# Patient Record
Sex: Male | Born: 1965 | Race: Black or African American | Hispanic: No | Smoking: Current every day smoker
Health system: Southern US, Community
[De-identification: ages and names within clinical notes are randomized; demographics above are authoritative.]

## PROBLEM LIST (undated history)

## (undated) HISTORY — PX: ANKLE SURGERY: SHX546

---

## 1997-09-28 ENCOUNTER — Emergency Department (HOSPITAL_COMMUNITY): Admission: EM | Admit: 1997-09-28 | Discharge: 1997-09-28 | Payer: Self-pay | Admitting: *Deleted

## 2000-01-29 ENCOUNTER — Emergency Department (HOSPITAL_COMMUNITY): Admission: EM | Admit: 2000-01-29 | Discharge: 2000-01-29 | Payer: Self-pay | Admitting: Emergency Medicine

## 2000-01-29 ENCOUNTER — Encounter: Payer: Self-pay | Admitting: Emergency Medicine

## 2002-02-07 ENCOUNTER — Encounter: Payer: Self-pay | Admitting: Otolaryngology

## 2002-02-07 ENCOUNTER — Observation Stay (HOSPITAL_COMMUNITY): Admission: EM | Admit: 2002-02-07 | Discharge: 2002-02-07 | Payer: Self-pay | Admitting: Emergency Medicine

## 2002-02-07 ENCOUNTER — Emergency Department (HOSPITAL_COMMUNITY): Admission: EM | Admit: 2002-02-07 | Discharge: 2002-02-07 | Payer: Self-pay | Admitting: Emergency Medicine

## 2002-02-07 ENCOUNTER — Encounter: Payer: Self-pay | Admitting: Emergency Medicine

## 2004-10-15 ENCOUNTER — Emergency Department (HOSPITAL_COMMUNITY): Admission: EM | Admit: 2004-10-15 | Discharge: 2004-10-15 | Payer: Self-pay | Admitting: Emergency Medicine

## 2005-05-17 ENCOUNTER — Emergency Department (HOSPITAL_COMMUNITY): Admission: EM | Admit: 2005-05-17 | Discharge: 2005-05-17 | Payer: Self-pay | Admitting: *Deleted

## 2005-11-05 ENCOUNTER — Emergency Department (HOSPITAL_COMMUNITY): Admission: EM | Admit: 2005-11-05 | Discharge: 2005-11-05 | Payer: Self-pay | Admitting: Emergency Medicine

## 2006-06-07 ENCOUNTER — Emergency Department (HOSPITAL_COMMUNITY): Admission: EM | Admit: 2006-06-07 | Discharge: 2006-06-07 | Payer: Self-pay | Admitting: Emergency Medicine

## 2007-01-02 ENCOUNTER — Emergency Department (HOSPITAL_COMMUNITY): Admission: EM | Admit: 2007-01-02 | Discharge: 2007-01-02 | Payer: Self-pay | Admitting: Emergency Medicine

## 2008-01-04 ENCOUNTER — Emergency Department (HOSPITAL_COMMUNITY): Admission: EM | Admit: 2008-01-04 | Discharge: 2008-01-04 | Payer: Self-pay | Admitting: Emergency Medicine

## 2008-04-24 ENCOUNTER — Emergency Department (HOSPITAL_COMMUNITY): Admission: EM | Admit: 2008-04-24 | Discharge: 2008-04-24 | Payer: Self-pay | Admitting: *Deleted

## 2008-04-27 ENCOUNTER — Emergency Department (HOSPITAL_COMMUNITY): Admission: EM | Admit: 2008-04-27 | Discharge: 2008-04-27 | Payer: Self-pay | Admitting: Family Medicine

## 2008-07-06 ENCOUNTER — Emergency Department (HOSPITAL_COMMUNITY): Admission: EM | Admit: 2008-07-06 | Discharge: 2008-07-06 | Payer: Self-pay | Admitting: Emergency Medicine

## 2008-08-09 ENCOUNTER — Emergency Department (HOSPITAL_COMMUNITY): Admission: EM | Admit: 2008-08-09 | Discharge: 2008-08-09 | Payer: Self-pay | Admitting: Emergency Medicine

## 2010-02-03 ENCOUNTER — Emergency Department (HOSPITAL_COMMUNITY)
Admission: EM | Admit: 2010-02-03 | Discharge: 2010-02-03 | Payer: Self-pay | Source: Home / Self Care | Admitting: Emergency Medicine

## 2010-03-09 ENCOUNTER — Emergency Department (HOSPITAL_COMMUNITY)
Admission: EM | Admit: 2010-03-09 | Discharge: 2010-03-09 | Payer: Self-pay | Source: Home / Self Care | Admitting: Emergency Medicine

## 2010-05-14 ENCOUNTER — Emergency Department (HOSPITAL_COMMUNITY)
Admission: EM | Admit: 2010-05-14 | Discharge: 2010-05-14 | Disposition: A | Discharge status: Home / Self Care | Payer: Self-pay | Attending: Emergency Medicine | Admitting: Emergency Medicine

## 2010-05-14 DIAGNOSIS — S0003XA Contusion of scalp, initial encounter: Secondary | ICD-10-CM | POA: Insufficient documentation

## 2010-05-14 DIAGNOSIS — S1093XA Contusion of unspecified part of neck, initial encounter: Secondary | ICD-10-CM | POA: Insufficient documentation

## 2010-05-14 DIAGNOSIS — S01501A Unspecified open wound of lip, initial encounter: Secondary | ICD-10-CM | POA: Insufficient documentation

## 2010-05-20 ENCOUNTER — Emergency Department (HOSPITAL_COMMUNITY)
Admission: EM | Admit: 2010-05-20 | Discharge: 2010-05-20 | Disposition: A | Discharge status: Home / Self Care | Payer: Self-pay | Attending: Emergency Medicine | Admitting: Emergency Medicine

## 2010-05-20 DIAGNOSIS — Z4802 Encounter for removal of sutures: Secondary | ICD-10-CM | POA: Insufficient documentation

## 2010-05-23 LAB — COMPREHENSIVE METABOLIC PANEL
ALT: 17 U/L (ref 0–53)
Alkaline Phosphatase: 64 U/L (ref 39–117)
Chloride: 103 mEq/L (ref 96–112)
GFR calc Af Amer: >60 mL/min (ref >60)
Potassium: 3.9 mEq/L (ref 3.5–5.1)
Sodium: 137 mEq/L (ref 135–145)

## 2010-05-23 LAB — DIFFERENTIAL
Lymphocytes Relative: 26 % (ref 12–46)
Neutrophils Relative %: 59 % (ref 43–77)

## 2010-05-23 LAB — CBC
Platelets: 301 10*3/uL (ref 150–400)
RDW: 14.9 % (ref 11.5–15.5)

## 2010-07-01 NOTE — Op Note (Signed)
NAMEANDRW, MCGUIRT NO.:  [id number]   MEDICAL RECORD NO.:  [id number]                   PATIENT TYPE:  OBV   LOCATION:  2887                                 FACILITY:  [facility]   PHYSICIAN:  Zola Button T. Lazarus Salines, M.D.              DATE OF BIRTH:  1965-11-27   DATE OF PROCEDURE:  02/07/2002  DATE OF DISCHARGE:  02/07/2002                                 OPERATIVE REPORT   PREOPERATIVE DIAGNOSIS:  Possible pharyngeal foreign body.   POSTOPERATIVE DIAGNOSIS:  No foreign body identified, pharyngeal  excoriation.   PROCEDURE:  Direct laryngoscopy, esophagoscopy.   SURGEON:  Gloris Manchester. Lazarus Salines, M.D.   ANESTHESIA:  General orotracheal.   ESTIMATED BLOOD LOSS:  None.   COMPLICATIONS:  None.   FINDINGS:  Some minor excoriation in the left lateral pharynx down toward  the piriform and a small excoriation on the posterior wall of the esophagus  several centimeters below the cricopharyngeus.  Otherwise no foreign body  identified and no other significant lesions.   DESCRIPTION OF PROCEDURE:  With the patient in a comfortable supine  position, general orotracheal anesthesia was induced without difficulty.  At  an appropriate level, the table was turned 90 degrees and the patient placed  in a slight reverse Trendelenburg.  The rubber tooth guard was placed.  Taking care to protect lips, teeth, and endotracheal tube, the Dedo  laryngoscope was introduced.  Full inspection of the oropharynx,  hypopharynx, and larynx were performed with special attention to the  residual tonsils, the lingual tonsils, the valleculae,  the piriforms, and  the laryngeal ventricles.  No findings were noted except as above.  The  laryngoscope was removed.  The cervical esophagoscope was lubricated and  passed easily to the cricopharyngeus muscle and down to its full length with  no significant findings.  This was removed.  The full-length esophagoscope  was lubricated and passed in  the same fashion to its full length, again with  no significant findings.  The esophagoscope was removed.  The tooth guard  was removed.  The entire oral cavity, including the lingual gutter, base of  tongue, tonsils, nasopharynx, oropharynx, hypopharynx, was palpated with no  foreign body identified.  The pharynx was suctioned free of some blood and  old phlegm.  The Dedo laryngoscope was reintroduced, again using the rubber  upper tooth guard, and inspection of the valleculae, base of tongue,  tonsils, piriforms, postcricoid larynx, and esophageal introitus again were  devoid of foreign bodies with no other significant findings.  The  laryngoscope was removed.  The rubber tooth guard was removed.  The dental  status was intact.  The patient was returned to anesthesia, awakened, and  transferred to recovery in stable condition.    COMMENT:  A 45 year old black male not quite 24 hours status post ingestion  of what he thought was a bone from a [employer]  with persistent  foreign body sensation and a negative ER examination was the indication for  today's procedure.  Anticipate a routine postoperative recovery with  attention to analgesia and advancement of routine diet and activities.                                               Gloris Manchester. Lazarus Salines, M.D.    KTW/MEDQ  D:  02/07/2002  T:  02/08/2002  Job:  [id number]

## 2010-07-01 NOTE — Consult Note (Signed)
NAMEDINNIS, ROG NO.:  [id number]   MEDICAL RECORD NO.:  [id number]                   PATIENT TYPE:   LOCATION:                                       FACILITY:  [facility]   PHYSICIAN:  Zola Button T. Lazarus Butler, M.D.              DATE OF BIRTH:  Jun 07, 1965   DATE OF CONSULTATION:  02/07/2002  DATE OF DISCHARGE:  02/07/2002                                   CONSULTATION   REPORT TITLE:  EMERGENCY ROOM CONSULTATION   CHIEF COMPLAINT:  Left throat discomfort.   HISTORY OF PRESENT ILLNESS:  The patient is a 46 year old black male was  eating [employer] for Christmas dinner yesterday and felt something  lodge in his left throat. He attempted to dislodge it by swallowing bread  without success. He was unable to complete his meal. The sensation has  remained there since last evening. He has not had anything to eat or drink  this morning. It feels like it pokes him every single time he tries to  swallow. He feels like he has to clear his throat and also use his voice  gently or it makes it tickle worse. No actual pain in his voice, breathing  difficulty or hoarseness. He has not brought up any blood, no fever, no  prior similar sensations.  He does not have any known reflux.  He has never  had a foreign body lodged previously.   PAST MEDICAL HISTORY:  No history of diabetes, asthma, HIV, hepatitis,  epilepsy. He has had prior ankle surgery. No history of breathing problems  or anesthetic reactions. Nobody in the family has anesthesia or breathing  problems. He is a one pack per day smoker and drinks as much as six beers  per day.   SOCIAL HISTORY:  He is unemployed. He works as a Financial risk analyst. He smokes and drinks  as above.   PHYSICAL EXAMINATION:  GENERAL:  This is a thin, basically healthy  appearing, adult black male. He is using his voice smoothly but quite  softly. He does gently clear his throat quite frequently during the  interview. He is not spitting  or having  trouble handling his secretions.  Mental status is appropriate. He hears well in conversational speech voice.  HEENT:  Head is atraumatic and neck supple. Cranial nerves intact. Ears:  The right ear canal was moderately waxy but not obstructed. The left ear  canal is clear with a normal aerated drum. The anterior nose is clear with a  mild leftward septal deviation and healthy membranes. The oral cavity is  clear with teeth in good repair. He has 1+ tonsils with no visible or  palpable foreign body. On palpation of his tonsils, he does not feel like  this is immediately close to the symptoms he is having; did not examine the  nasopharynx directly. Hypopharynx could not be examined secondary to gag.  NECK:  Examination was normal. Laryngeal crepitus and no adenopathy.   PROCEDURE:  Following Xylocaine viscous in his nose, 10 cc total, the  flexible laryngoscope was introduced. The nasopharynx was clear. The  oropharynx was clear with slightly prominent lingular tonsillar tissue but  no pooling in the valleculae or piriformis.  The endolarynx shows mobile  vocal cords and a good airway. No erythema, no blood. I could not identify a  foreign body.   Finally, I swept my finger over the base of his tongue without any palpable  foreign bodies. The patient did not feel like we were immediately next to  the lesion. The foreign body sensation was improved after the viscous  Xylocaine, but still present.   IMPRESSION:  Possible left pharyngeal/esophageal foreign body.   PLAN:  I recommend, since he is still having active symptoms that we go to  the operating room and, under general anesthesia, do a direct laryngoscopy  and esophagoscopy to attempt to identify and remove a foreign body. I  discussed this with him including the risks and complications. Questions  were answered and informed consent was obtained. A routine preoperative  history and physical was reported without  contraindications.                                               Brett Butler, M.D.    KTW/MEDQ  D:  02/07/2002  T:  02/08/2002  Job:  [id number]

## 2011-11-16 ENCOUNTER — Other Ambulatory Visit: Payer: Self-pay | Admitting: General Practice

## 2011-11-16 ENCOUNTER — Ambulatory Visit
Admission: RE | Admit: 2011-11-16 | Discharge: 2011-11-16 | Disposition: A | Discharge status: Home / Self Care | Payer: No Typology Code available for payment source | Source: Ambulatory Visit | Attending: General Practice | Admitting: General Practice

## 2011-11-16 DIAGNOSIS — Z201 Contact with and (suspected) exposure to tuberculosis: Secondary | ICD-10-CM

## 2012-08-25 ENCOUNTER — Emergency Department (INDEPENDENT_AMBULATORY_CARE_PROVIDER_SITE_OTHER)
Admission: EM | Admit: 2012-08-25 | Discharge: 2012-08-25 | Disposition: A | Discharge status: Home / Self Care | Payer: BC Managed Care – PPO | Source: Home / Self Care

## 2012-08-25 ENCOUNTER — Encounter (HOSPITAL_COMMUNITY): Payer: Self-pay | Admitting: *Deleted

## 2012-08-25 DIAGNOSIS — M549 Dorsalgia, unspecified: Secondary | ICD-10-CM

## 2012-08-25 DIAGNOSIS — G8929 Other chronic pain: Secondary | ICD-10-CM

## 2012-08-25 DIAGNOSIS — T6391XA Toxic effect of contact with unspecified venomous animal, accidental (unintentional), initial encounter: Secondary | ICD-10-CM

## 2012-08-25 DIAGNOSIS — T63461A Toxic effect of venom of wasps, accidental (unintentional), initial encounter: Secondary | ICD-10-CM

## 2012-08-25 NOTE — ED Provider Notes (Signed)
Medical screening examination/treatment/procedure(s) were performed by a resident physician and as supervising physician I was immediately available for consultation/collaboration.  Leslee Home, M.D.   Reuben Likes, MD 08/25/12 [phone]

## 2012-08-25 NOTE — ED Notes (Signed)
Ice pack provided for insect sting.

## 2012-08-25 NOTE — ED Provider Notes (Signed)
Brett Butler is a 47 y.o. male who presents to Urgent Care today for bee sting on the right forearm occurring approximately 60 minutes prior to presentation. Patient initially had local pain and erythema which is improving now. He tried applying tobacco which did not help much. He denies any trouble breathing itching swelling in his mouth lips or tongue and feels well otherwise.   He does however also note a chronic intermittent back pain for the last 10 years or so. He denies any recent injury and feels well currently. 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 He denies any radicular pain weakness or numbness.   PMH reviewed. Healthy otherwise History  Substance Use Topics  . Smoking status: Current Every Day Smoker    Types: Cigarettes  . Smokeless tobacco: Not on file  . Alcohol Use: 6.0 oz/week    10 Cans of beer per week   ROS as above Medications reviewed. No current facility-administered medications for this encounter.   No current outpatient prescriptions on file.    Exam:  BP 113/76  Pulse 77  Temp(Src) 99.1 F (37.3 C) (Oral)  Resp 17  SpO2 99% Gen: Well NAD HEENT: EOMI,  MMM Lungs: CTABL Nl WOB Heart: RRR no MRG Exts: Non edematous BL  LE, warm and well perfused.  Right forearm: Small localized approximately quarter sized area of induration and erythema. Well otherwise.  Back: Nontender to spinal midline normal range of motion and gait.  No results found for this or any previous visit (from the past 24 hour(s)). No results found.  Assessment and Plan: 47 y.o. male with  1) acute bee sting: No signs or symptoms of systemic reaction.  Plan for local care with hydrocortisone cream ice and ibuprofen Followup as needed 2) back pain: Reassurance. Ibuprofen and heat. Handout provided.  Discussed warning signs or symptoms. Please see discharge instructions. Patient expresses understanding.      Rodolph Bong,  MD 08/25/12 [phone]

## 2012-08-25 NOTE — ED Notes (Signed)
Reports being stung by unk insect in right forearm approx 30-60 min ago.  Denies any hx allergic reaction to insect stings - states just wanted to get checked out since "it's been a long, long time since I've been stung".  Has applied tobacco to site.  C/O throbbing.

## 2013-04-09 ENCOUNTER — Encounter (HOSPITAL_COMMUNITY): Payer: Self-pay | Admitting: Emergency Medicine

## 2013-04-09 ENCOUNTER — Emergency Department (HOSPITAL_COMMUNITY)
Admission: EM | Admit: 2013-04-09 | Discharge: 2013-04-09 | Disposition: A | Discharge status: Home / Self Care | Payer: BC Managed Care – PPO | Attending: Emergency Medicine | Admitting: Emergency Medicine

## 2013-04-09 ENCOUNTER — Emergency Department (HOSPITAL_COMMUNITY): Payer: BC Managed Care – PPO

## 2013-04-09 DIAGNOSIS — R51 Headache: Secondary | ICD-10-CM | POA: Insufficient documentation

## 2013-04-09 DIAGNOSIS — G8929 Other chronic pain: Secondary | ICD-10-CM | POA: Insufficient documentation

## 2013-04-09 DIAGNOSIS — F17200 Nicotine dependence, unspecified, uncomplicated: Secondary | ICD-10-CM | POA: Insufficient documentation

## 2013-04-09 DIAGNOSIS — R4182 Altered mental status, unspecified: Secondary | ICD-10-CM | POA: Insufficient documentation

## 2013-04-09 LAB — CBC WITH DIFFERENTIAL/PLATELET
BASOS ABS: 0.1 10*3/uL (ref 0.0–0.1)
Basophils Relative: 1 % (ref 0–1)
EOS ABS: 0.1 10*3/uL (ref 0.0–0.7)
Eosinophils Relative: 2 % (ref 0–5)
HEMATOCRIT: 40.5 % (ref 39.0–52.0)
Hemoglobin: 14 g/dL (ref 13.0–17.0)
LYMPHS ABS: 2.4 10*3/uL (ref 0.7–4.0)
LYMPHS PCT: 34 % (ref 12–46)
MCH: 31.9 pg (ref 26.0–34.0)
MCHC: 34.6 g/dL (ref 30.0–36.0)
MCV: 92.3 fL (ref 78.0–100.0)
Monocytes Absolute: 0.9 10*3/uL (ref 0.1–1.0)
Monocytes Relative: 13 % — ABNORMAL HIGH (ref 3–12)
NEUTROS PCT: 50 % (ref 43–77)
Neutro Abs: 3.4 10*3/uL (ref 1.7–7.7)
PLATELETS: 322 10*3/uL (ref 150–400)
RBC: 4.39 MIL/uL (ref 4.22–5.81)
RDW: 12.8 % (ref 11.5–15.5)
WBC: 6.9 10*3/uL (ref 4.0–10.5)

## 2013-04-09 LAB — COMPREHENSIVE METABOLIC PANEL
ALBUMIN: 4.4 g/dL (ref 3.5–5.2)
ALT: 15 U/L (ref 0–53)
AST: 25 U/L (ref 0–37)
Alkaline Phosphatase: 50 U/L (ref 39–117)
BILIRUBIN TOTAL: 0.4 mg/dL (ref 0.3–1.2)
BUN: 32 mg/dL — AB (ref 6–23)
CALCIUM: 9.9 mg/dL (ref 8.4–10.5)
CHLORIDE: 97 meq/L (ref 96–112)
CO2: 22 mEq/L (ref 19–32)
CREATININE: 0.81 mg/dL (ref 0.50–1.35)
GFR calc Af Amer: >90 mL/min (ref >90)
GLUCOSE: 126 mg/dL — AB (ref 70–99)
Potassium: 3.8 mEq/L (ref 3.7–5.3)
Sodium: 136 mEq/L — ABNORMAL LOW (ref 137–147)
Total Protein: 8.4 g/dL — ABNORMAL HIGH (ref 6.0–8.3)

## 2013-04-09 LAB — RAPID URINE DRUG SCREEN, HOSP PERFORMED
Amphetamines: NOT DETECTED
BARBITURATES: NOT DETECTED
Benzodiazepines: POSITIVE — AB
COCAINE: NOT DETECTED
OPIATES: NOT DETECTED
Tetrahydrocannabinol: NOT DETECTED

## 2013-04-09 LAB — URINALYSIS, ROUTINE W REFLEX MICROSCOPIC
Bilirubin Urine: NEGATIVE
Glucose, UA: NEGATIVE mg/dL
KETONES UR: NEGATIVE mg/dL
Leukocytes, UA: NEGATIVE
NITRITE: NEGATIVE
PH: 5 (ref 5.0–8.0)
PROTEIN: 30 mg/dL — AB
Specific Gravity, Urine: 1.03 (ref 1.005–1.030)
UROBILINOGEN UA: 0.2 mg/dL (ref 0.0–1.0)

## 2013-04-09 LAB — ETHANOL

## 2013-04-09 LAB — URINE MICROSCOPIC-ADD ON

## 2013-04-09 LAB — LIPASE, BLOOD: LIPASE: 14 U/L (ref 11–59)

## 2013-04-09 NOTE — ED Provider Notes (Signed)
CSN: [id number]     Arrival date & time 04/09/13  1741 History   First MD Initiated Contact with Patient 04/09/13 2105     Chief Complaint  Patient presents with  . Altered Mental Status     (Consider location/radiation/quality/duration/timing/severity/associated sxs/prior Treatment) Patient is a 48 y.o. male presenting with altered mental status. The history is provided by the patient.  Altered Mental Status Presenting symptoms: behavior changes   Severity:  Moderate Most recent episode:  Yesterday Episode history:  Continuous Timing:  Constant Progression:  Resolved Chronicity:  New Context comment:  Incarcerated Associated symptoms: headaches   Associated symptoms: no abdominal pain, no fever, no nausea and no vomiting     History reviewed. No pertinent past medical history. Past Surgical History  Procedure Laterality Date  . Ankle surgery     History reviewed. No pertinent family history. History  Substance Use Topics  . Smoking status: Current Every Day Smoker    Types: Cigarettes  . Smokeless tobacco: Not on file  . Alcohol Use: 6.0 oz/week    10 Cans of beer per week    Review of Systems  Constitutional: Negative for fever.  HENT: Negative for drooling and rhinorrhea.   Eyes: Negative for pain.  Respiratory: Negative for cough and shortness of breath.   Cardiovascular: Negative for chest pain and leg swelling.  Gastrointestinal: Negative for nausea, vomiting, abdominal pain and diarrhea.  Genitourinary: Negative for dysuria and hematuria.  Musculoskeletal: Negative for gait problem and neck pain.  Skin: Negative for color change.  Neurological: Positive for headaches. Negative for numbness.  Hematological: Negative for adenopathy.  Psychiatric/Behavioral: Negative for behavioral problems.  All other systems reviewed and are negative.      Allergies  Other  Home Medications   Current Outpatient Rx  Name  Route  Sig  Dispense  Refill  .  chlordiazePOXIDE (LIBRIUM) 25 MG capsule   Oral   Take 25 mg by mouth 3 (three) times daily as needed for anxiety (anxiety).          BP 136/87  Pulse 93  Temp(Src) 99.3 F (37.4 C) (Oral)  Resp 20  SpO2 99% Physical Exam  Nursing note and vitals reviewed. Constitutional: He is oriented to person, place, and time. He appears well-developed and well-nourished.  HENT:  Head: Normocephalic and atraumatic.  Right Ear: External ear normal.  Left Ear: External ear normal.  Nose: Nose normal.  Mouth/Throat: Oropharynx is clear and moist. No oropharyngeal exudate.  Eyes: Conjunctivae and EOM are normal. Pupils are equal, round, and reactive to light.  Neck: Normal range of motion. Neck supple.  Cardiovascular: Normal rate, regular rhythm, normal heart sounds and intact distal pulses.  Exam reveals no gallop and no friction rub.   No murmur heard. Pulmonary/Chest: Effort normal and breath sounds normal. No respiratory distress. He has no wheezes.  Abdominal: Soft. Bowel sounds are normal. He exhibits no distension. There is no tenderness. There is no rebound and no guarding.  Musculoskeletal: Normal range of motion. He exhibits no edema and no tenderness.  Neurological: He is alert and oriented to person, place, and time. He has normal strength. No cranial nerve deficit or sensory deficit. Coordination and gait normal.  Skin: Skin is warm and dry.  Psychiatric: He has a normal mood and affect. His behavior is normal.    ED Course  Procedures (including critical care time) Labs Review Labs Reviewed  CBC WITH DIFFERENTIAL - Abnormal; Notable for the following:  Monocytes Relative 13 (*)    All other components within normal limits  COMPREHENSIVE METABOLIC PANEL - Abnormal; Notable for the following:    Sodium 136 (*)    Glucose, Bld 126 (*)    BUN 32 (*)    Total Protein 8.4 (*)    All other components within normal limits  URINALYSIS, ROUTINE W REFLEX MICROSCOPIC - Abnormal;  Notable for the following:    Color, Urine AMBER (*)    APPearance CLOUDY (*)    Hgb urine dipstick TRACE (*)    Protein, ur 30 (*)    All other components within normal limits  URINE RAPID DRUG SCREEN (HOSP PERFORMED) - Abnormal; Notable for the following:    Benzodiazepines POSITIVE (*)    All other components within normal limits  LIPASE, BLOOD  ETHANOL  URINE MICROSCOPIC-ADD ON   Imaging Review Ct Head Wo Contrast  04/09/2013   CLINICAL DATA:  Possible seizure  EXAM: CT HEAD WITHOUT CONTRAST  TECHNIQUE: Contiguous axial images were obtained from the base of the skull through the vertex without intravenous contrast.  COMPARISON:  Prior CT from 08/09/2008  FINDINGS: There is no acute intracranial hemorrhage or infarct. No mass lesion or midline shift. Gray-white matter differentiation is well maintained. Ventricles are normal in size without evidence of hydrocephalus. CSF containing spaces are within normal limits. No extra-axial fluid collection.  The calvarium is intact.  Orbital soft tissues are within normal limits.  Scattered mucoperiosteal thickening present within the ethmoidal air cells bilaterally. Paranasal sinuses are otherwise clear. No mastoid effusion.  Scalp soft tissues are unremarkable.  IMPRESSION: 1. No acute intracranial process. 2. Mild ethmoidal sinus disease.   Electronically Signed   By: Rise MuBenjamin  McClintock M.D.   On: 04/09/2013 21:45    EKG Interpretation   None       MDM   Final diagnoses:  Altered mental status    9:44 PM 48 y.o. male a history of alcohol use who presents with altered mental status from jail. The patient was incarcerated on February 7 and had been on Librium until the 20th. The librium was discontinued at that time. I spoke with a nurse Lawanna KobusAngel, at jail, he noted that the patient was talking to himself yesterday evening and had slightly unusual behavior. She notes that discontinued today and he was sent here for evaluation. She denies any  other symptoms including fevers, vomiting, diarrhea, or pain. The patient is afebrile and vital signs are unremarkable here. He is alert and oriented x3. He complains of chronic arthritis type pains but notes no change from baseline. The police officer states that while in triage he saw him shaking and staring for several minutes. There is no note of this from nursing. Patient has no history of seizures. He does not appear to be withdrawing from alcohol on my exam. He is calm, interactive, not diaphoretic, and has completely unremarkable vital signs. Will get screening labwork, urinalysis, and CT of head. The nurse at his facility noted that he was restarted on his Librium yesterday. I suspect that his altered mental status was likely related to being off the Librium. The patient states that he drinks one beer per day and sometimes binges. He denies SI/HI.   10:18 PM: Normal neuro exam. I interpreted/reviewed the labs and/or imaging which were non-contributory. I think pt's intermittent AMS likely related to being taken off of librium which he is now back on. Will recommend a longer taper. The pt states he was not  altered and was just praying.   I have discussed the diagnosis/risks/treatment options with the patient and believe the pt to be eligible for discharge home to follow-up with pcp as needed. We also discussed returning to the ED immediately if new or worsening sx occur. We discussed the sx which are most concerning (e.g., recurrent AMS, fever) that necessitate immediate return. Medications administered to the patient during their visit and any new prescriptions provided to the patient are listed below.  Medications given during this visit Medications - No data to display  New Prescriptions   No medications on file     Junius Argyle, MD 04/10/13 [phone]

## 2013-04-09 NOTE — Discharge Instructions (Signed)
Perform screening labwork, urinalysis, urine drug screen, ethanol level, and CT of head. All were noncontributory. The patient is alert and oriented here. He is appropriate on my exam. He denies any suicidal or homicidal ideations. He does not appear to be acutely withdrawing from alcohol. I suspect that his altered mental status may have been related to the discontinuation of Librium. I recommend that he continue taking Librium while he is incarcerated. He may need a longer taper. He can be further evaluated by the jail physician as needed.

## 2013-04-09 NOTE — ED Notes (Signed)
Bed: WA20 Expected date:  Expected time:  Means of arrival:  Comments: Triage. 4 

## 2013-04-09 NOTE — ED Notes (Signed)
Pt in custody.  Pt brought here for confusion and ?changes in eye color as noted by documentation from jail.  Pt is alert and oriented at the times.  Denies any pain and cannot say why he was brought here.  Pt does states he had a headache but now it is gone.

## 2013-04-09 NOTE — ED Notes (Signed)
Patient ambulatory upon discharge without difficulty. Patient denies dizziness/vertigo.

## 2019-04-28 DIAGNOSIS — F1020 Alcohol dependence, uncomplicated: Secondary | ICD-10-CM | POA: Insufficient documentation

## 2019-04-28 DIAGNOSIS — F1121 Opioid dependence, in remission: Secondary | ICD-10-CM | POA: Insufficient documentation

## 2019-04-28 DIAGNOSIS — F439 Reaction to severe stress, unspecified: Secondary | ICD-10-CM | POA: Insufficient documentation

## 2019-04-28 DIAGNOSIS — F411 Generalized anxiety disorder: Secondary | ICD-10-CM | POA: Insufficient documentation

## 2019-07-30 ENCOUNTER — Emergency Department (HOSPITAL_COMMUNITY)
Admission: EM | Admit: 2019-07-30 | Discharge: 2019-07-31 | Disposition: A | Discharge status: Home / Self Care | Payer: Self-pay | Attending: Emergency Medicine | Admitting: Emergency Medicine

## 2019-07-30 DIAGNOSIS — T401X1A Poisoning by heroin, accidental (unintentional), initial encounter: Secondary | ICD-10-CM | POA: Insufficient documentation

## 2019-07-30 DIAGNOSIS — F17210 Nicotine dependence, cigarettes, uncomplicated: Secondary | ICD-10-CM | POA: Insufficient documentation

## 2019-07-30 DIAGNOSIS — Z79899 Other long term (current) drug therapy: Secondary | ICD-10-CM | POA: Insufficient documentation

## 2019-07-30 LAB — COMPREHENSIVE METABOLIC PANEL
ALT: 16 U/L (ref 0–44)
AST: 22 U/L (ref 15–41)
Albumin: 3.8 g/dL (ref 3.5–5.0)
Alkaline Phosphatase: 45 U/L (ref 38–126)
Anion gap: 15 (ref 5–15)
BUN: 17 mg/dL (ref 6–20)
CO2: 17 mmol/L — ABNORMAL LOW (ref 22–32)
Calcium: 8.6 mg/dL — ABNORMAL LOW (ref 8.9–10.3)
Chloride: 106 mmol/L (ref 98–111)
Creatinine, Ser: 1.04 mg/dL (ref 0.61–1.24)
GFR calc Af Amer: >60 mL/min (ref >60)
GFR calc non Af Amer: >60 mL/min (ref >60)
Glucose, Bld: 161 mg/dL — ABNORMAL HIGH (ref 70–99)
Potassium: 3.6 mmol/L (ref 3.5–5.1)
Sodium: 138 mmol/L (ref 135–145)
Total Bilirubin: 0.6 mg/dL (ref 0.3–1.2)
Total Protein: 7 g/dL (ref 6.5–8.1)

## 2019-07-30 LAB — CBC WITH DIFFERENTIAL/PLATELET
Abs Immature Granulocytes: 0.04 10*3/uL (ref 0.00–0.07)
Basophils Absolute: 0.1 10*3/uL (ref 0.0–0.1)
Basophils Relative: 1 %
Eosinophils Absolute: 0.3 10*3/uL (ref 0.0–0.5)
Eosinophils Relative: 4 %
HCT: 38.2 % — ABNORMAL LOW (ref 39.0–52.0)
Hemoglobin: 12.6 g/dL — ABNORMAL LOW (ref 13.0–17.0)
Immature Granulocytes: 1 %
Lymphocytes Relative: 32 %
Lymphs Abs: 2.5 10*3/uL (ref 0.7–4.0)
MCH: 31.8 pg (ref 26.0–34.0)
MCHC: 33 g/dL (ref 30.0–36.0)
MCV: 96.5 fL (ref 80.0–100.0)
Monocytes Absolute: 0.8 10*3/uL (ref 0.1–1.0)
Monocytes Relative: 10 %
Neutro Abs: 4.1 10*3/uL (ref 1.7–7.7)
Neutrophils Relative %: 52 %
Platelets: 303 10*3/uL (ref 150–400)
RBC: 3.96 MIL/uL — ABNORMAL LOW (ref 4.22–5.81)
RDW: 14 % (ref 11.5–15.5)
WBC: 7.8 10*3/uL (ref 4.0–10.5)
nRBC: 0 % (ref 0.0–0.2)

## 2019-07-30 MED ORDER — ONDANSETRON 4 MG PO TBDP
4.0000 mg | ORAL_TABLET | Freq: Once | ORAL | Status: AC
  Administered 2019-07-30: 4 mg via ORAL
  Filled 2019-07-30: qty 1

## 2019-07-30 NOTE — ED Provider Notes (Signed)
Care transferred from [place], [place] at shift change.  See note for full HPI.  In summation 54 year old male presents for evaluation of unintentional overdose. History of polysubstance use.  Given 2 mg Narcan by EMS.  Currently homeless and living with sister.  Previous provider patient has no complaints.  Labs obtained from previous provider.  Pending UDS. Plan to obs patient.  Patient apparently had one episode of NBNB emesis here in ED.  Will give Zofran, fluids. Physical Exam  BP 132/89   Pulse 79   Temp (!) 97.5 F (36.4 C) (Oral)   Resp 13   SpO2 93%   Physical Exam Vitals and nursing note reviewed.  Constitutional:      General: He is not in acute distress.    Appearance: He is well-developed. He is not ill-appearing, toxic-appearing or diaphoretic.  HENT:     Head: Normocephalic and atraumatic.  Eyes:     Pupils: Pupils are equal, round, and reactive to light.  Cardiovascular:     Rate and Rhythm: Normal rate and regular rhythm.     Pulses: Normal pulses.     Heart sounds: Normal heart sounds.  Pulmonary:     Effort: Pulmonary effort is normal. No respiratory distress.     Breath sounds: Normal breath sounds.  Abdominal:     General: Bowel sounds are normal. There is no distension.     Palpations: Abdomen is soft.  Musculoskeletal:        General: Normal range of motion.     Cervical back: Normal range of motion and neck supple.  Skin:    General: Skin is warm and dry.     Capillary Refill: Capillary refill takes less than 2 seconds.  Neurological:     General: No focal deficit present.     Mental Status: He is alert and oriented to person, place, and time.     Comments: AxO x 4. Ambulatory without difficulty. CN 2-12 grossly intact      ED Course/Procedures    No results found.  Procedures Labs Reviewed  RAPID URINE DRUG SCREEN, HOSP PERFORMED - Abnormal; Notable for the following components:      Result Value   Opiates POSITIVE (*)    Cocaine POSITIVE (*)     All other components within normal limits  CBC WITH DIFFERENTIAL/PLATELET - Abnormal; Notable for the following components:   RBC 3.96 (*)    Hemoglobin 12.6 (*)    HCT 38.2 (*)    All other components within normal limits  COMPREHENSIVE METABOLIC PANEL - Abnormal; Notable for the following components:   CO2 17 (*)    Glucose, Bld 161 (*)    Calcium 8.6 (*)    All other components within normal limits   MDM  Polysubstance use. Narcan by EMS. Homeless intermittently lives with sister. GCS 15. Emesis here. OD in past. Unintentional OD here. No SI, HI, AVH.  Plan to obs, p.o. challenge.  0100: Patient states emesis after Zofran. AxO x 4. Will give additional antiemetics and reassess.  0300: Patient reassessed.  Ambulatory at difficulty.  Tolerating p.o. intake.  Patient ANO x4.  Resting DC home.  Offered outpatient resources for substance use. Continues to denies SI, HI, AVH.  The patient has been appropriately medically screened and/or stabilized in the ED. I have low suspicion for any other emergent medical condition which would require further screening, evaluation or treatment in the ED or require inpatient management.  Patient is hemodynamically stable and in no  acute distress.  Patient able to ambulate in department prior to ED.  Evaluation does not show acute pathology that would require ongoing or additional emergent interventions while in the emergency department or further inpatient treatment.  I have discussed the diagnosis with the patient and answered all questions.  Pain is been managed while in the emergency department and patient has no further complaints prior to discharge.  Patient is comfortable with plan discussed in room and is stable for discharge at this time.  I have discussed strict return precautions for returning to the emergency department.  Patient was encouraged to follow-up with PCP/specialist refer to at discharge.     Shakisha Abend A, PA-C 07/31/19  0301    Nira Conn, MD 07/31/19 2108

## 2019-07-30 NOTE — Discharge Instructions (Addendum)
Please read the attachment on local resources available for substance use disorder.  I would like you to call the [facility] to get established with a primary care provider for ongoing evaluation and management of your health and wellbeing.    Return to the ED or seek immediate medical attention should you develop any new or worsening symptoms.

## 2019-07-30 NOTE — ED Provider Notes (Signed)
[facility] EMERGENCY DEPARTMENT Provider Note   CSN: [id number] Arrival date & time: 07/30/19  2129     History Chief Complaint  Patient presents with   Drug Overdose    Brett Butler is a 54 y.o. male with no relevant past medical history who presents to the ED via EMS for drug overdose.  Patient had been witnessed by a pedestrian to be lowering himself to the ground at a gas station.  Upon EMS arrival, patient was unresponsive with agonal respirations.  He was given 2 mg Narcan and began spontaneous respirations.  On my examination, patient is drowsy but answering questions appropriately.  He states that he has been sniffing heroin since 1994.  He denies any IVDA.  He also admits to cocaine use earlier, as well.  He had 2 shots of vodka and 2 beers today PTA.  He states that he drinks approximately every other day, but has never had any withdrawals or seizures.  He denies any benzodiazepine use.  He does smoke cigarettes regularly.  He is currently homeless and living with his sister.  He is in the process of obtaining disability for his chronic back pain.  He denies any injury, pain, shortness of breath, chest discomfort, nausea or vomiting, numbness or weakness, or other symptoms.  He states that he has overdosed in the past, but nothing recently.  He denies any recent depression, SI or HI, AVH, or intentional overdose.  Level 5 caveat due to AMS.  HPI     No past medical history on file.  There are no problems to display for this patient.   Past Surgical History:  Procedure Laterality Date   ANKLE SURGERY         No family history on file.  Social History   Tobacco Use   Smoking status: Current Every Day Smoker    Types: Cigarettes  Substance Use Topics   Alcohol use: Yes    Alcohol/week: 10.0 standard drinks    Types: 10 Cans of beer per week   Drug use: No    Home Medications Prior to Admission medications   Medication Sig Start Date  End Date Taking? Authorizing Provider  chlordiazePOXIDE (LIBRIUM) 25 MG capsule Take 25 mg by mouth 3 (three) times daily as needed for anxiety (anxiety).    [provider]    Allergies    Other  Review of Systems   Review of Systems  Unable to perform ROS: Mental status change  All other systems reviewed and are negative.   Physical Exam Updated Vital Signs BP 112/75    Pulse 74    Temp (!) 97.5 F (36.4 C) (Oral)    Resp 10    SpO2 92%   Physical Exam Vitals and nursing note reviewed. Exam conducted with a chaperone present.  Constitutional:      Comments: Drowsy.  HENT:     Head: Normocephalic and atraumatic.  Eyes:     General: No scleral icterus.    Conjunctiva/sclera: Conjunctivae normal.  Cardiovascular:     Rate and Rhythm: Normal rate and regular rhythm.     Pulses: Normal pulses.     Heart sounds: Normal heart sounds.  Pulmonary:     Effort: Pulmonary effort is normal. No respiratory distress.     Breath sounds: Normal breath sounds.     Comments: No apnea or other evidence of respiratory depression.  12 respirations per minute.  No acute distress. Musculoskeletal:     Comments:  No midline spinal tenderness to palpation. No swelling, erythema, or other overlying skin changes involving back.   Skin:    General: Skin is dry.     Capillary Refill: Capillary refill takes less than 2 seconds.     Comments: No obvious track marks.  Neurological:     General: No focal deficit present.     Mental Status: He is alert and oriented to person, place, and time.     GCS: GCS eye subscore is 4. GCS verbal subscore is 5. GCS motor subscore is 6.     Comments: Able to demonstrate ROM and strength in all extremities. Sensation intact throughout. Gait not yet assessed.     ED Results / Procedures / Treatments   Labs (all labs ordered are listed, but only abnormal results are displayed) Labs Reviewed  CBC WITH DIFFERENTIAL/PLATELET - Abnormal; Notable for the  following components:      Result Value   RBC 3.96 (*)    Hemoglobin 12.6 (*)    HCT 38.2 (*)    All other components within normal limits  COMPREHENSIVE METABOLIC PANEL - Abnormal; Notable for the following components:   CO2 17 (*)    Glucose, Bld 161 (*)    Calcium 8.6 (*)    All other components within normal limits  RAPID URINE DRUG SCREEN, HOSP PERFORMED    EKG None  Radiology No results found.  Procedures Procedures (including critical care time)  Medications Ordered in ED Medications  ondansetron (ZOFRAN-ODT) disintegrating tablet 4 mg (4 mg Oral Given 07/30/19 2345)    ED Course  I have reviewed the triage vital signs and the nursing notes.  Pertinent labs & imaging results that were available during my care of the patient were reviewed by me and considered in my medical decision making (see chart for details).    MDM Rules/Calculators/A&P                          Patient's history and physical exam is consistent with a heroin overdose.  He had received Narcan at approximately 9:30 PM and has since responded quite well.  I feel as though it is reasonable to discharge him after 2 hours of observation assuming that his laboratory work-up is unremarkable, his GCS is 15, VS are stable and WNL, no evidence of respiratory depression, and he can ambulate here in the ED without any assistance.  On subsequent evaluation, patient is still drowsy, but respirations and remainder vital signs are reassuring.  Response to verbal stimuli and can move all extremities.  He is not in any acute distress.  He did become nauseated while I was in the room with an episode of nonbloody emesis.  His QT is WNL and will provide 4 mg Zofran ODT for his symptoms.  He states that he will provide [place] with a urine sample shortly.  Patient is feeling better after his episode of emesis and now after receiving IV Zofran ODT.  GCS 15.  He informs me that he only does heroin "once in a blue moon" however  his cocaine use is more regular.  He once again adamantly denies any IV heroin use or any depression/SI. No obvious track marks.  He states that he is taking care of his mother who was recently diagnosed with lung cancer.  At shift change care was transferred to [facility], PA-C who will follow pending studies, re-evaluate, and determine disposition.     Final  Clinical Impression(s) / ED Diagnoses Final diagnoses:  Accidental overdose of heroin, initial encounter [facility])    Rx / DC Orders ED Discharge Orders    None       Lorelee New, PA-C 07/30/19 2346    Sabas Sous, MD 08/01/19 0028

## 2019-07-30 NOTE — ED Triage Notes (Signed)
Pt BIB [facility] from gas station.   Pt seen guiding himself to the ground by pedestrian, upon EMS arrival pt unresponsive, agonal respirations. Pt given 2mg  Narcan, spontaneous respirations.   Pt admits to cocaine and heroine usage.   A&ox3 on arrival, lethargic, stuporous, poor historian.

## 2019-07-31 LAB — RAPID URINE DRUG SCREEN, HOSP PERFORMED
Amphetamines: NOT DETECTED
Barbiturates: NOT DETECTED
Benzodiazepines: NOT DETECTED
Cocaine: POSITIVE — AB
Opiates: POSITIVE — AB
Tetrahydrocannabinol: NOT DETECTED

## 2019-07-31 MED ORDER — ONDANSETRON HCL 4 MG/2ML IJ SOLN
4.0000 mg | Freq: Once | INTRAMUSCULAR | Status: AC
  Administered 2019-07-31: 4 mg via INTRAVENOUS
  Filled 2019-07-31: qty 2

## 2019-07-31 NOTE — ED Notes (Signed)
Patient ambulated without assistance.

## 2019-08-22 ENCOUNTER — Ambulatory Visit (HOSPITAL_COMMUNITY): Payer: Self-pay | Admitting: Licensed Clinical Social Worker

## 2019-09-10 ENCOUNTER — Ambulatory Visit (INDEPENDENT_AMBULATORY_CARE_PROVIDER_SITE_OTHER): Payer: No Payment, Other | Admitting: Psychiatry

## 2019-09-10 ENCOUNTER — Encounter (HOSPITAL_COMMUNITY): Payer: Self-pay | Admitting: Psychiatry

## 2019-09-10 ENCOUNTER — Other Ambulatory Visit: Payer: Self-pay

## 2019-09-10 DIAGNOSIS — F251 Schizoaffective disorder, depressive type: Secondary | ICD-10-CM | POA: Diagnosis not present

## 2019-09-10 DIAGNOSIS — F1120 Opioid dependence, uncomplicated: Secondary | ICD-10-CM | POA: Diagnosis not present

## 2019-09-10 DIAGNOSIS — F4310 Post-traumatic stress disorder, unspecified: Secondary | ICD-10-CM | POA: Diagnosis not present

## 2019-09-10 DIAGNOSIS — F1220 Cannabis dependence, uncomplicated: Secondary | ICD-10-CM | POA: Insufficient documentation

## 2019-09-10 MED ORDER — LURASIDONE HCL 40 MG PO TABS: 40.0000 mg | ORAL_TABLET | Freq: Every day | ORAL | 2 refills | Status: DC

## 2019-09-10 MED ORDER — DULOXETINE HCL 30 MG PO CPEP: 30.0000 mg | ORAL_CAPSULE | Freq: Two times a day (BID) | ORAL | 2 refills | Status: DC
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
## 2019-11-12 ENCOUNTER — Ambulatory Visit (HOSPITAL_COMMUNITY): Payer: No Payment, Other | Admitting: Psychiatry

## 2020-06-15 ENCOUNTER — Emergency Department (HOSPITAL_COMMUNITY)
Admission: EM | Admit: 2020-06-15 | Discharge: 2020-06-15 | Disposition: A | Discharge status: Home / Self Care | Payer: Self-pay | Attending: Emergency Medicine | Admitting: Emergency Medicine

## 2020-06-15 ENCOUNTER — Other Ambulatory Visit: Payer: Self-pay

## 2020-06-15 ENCOUNTER — Encounter (HOSPITAL_COMMUNITY): Payer: Self-pay

## 2020-06-15 DIAGNOSIS — F17210 Nicotine dependence, cigarettes, uncomplicated: Secondary | ICD-10-CM | POA: Insufficient documentation

## 2020-06-15 DIAGNOSIS — T50901A Poisoning by unspecified drugs, medicaments and biological substances, accidental (unintentional), initial encounter: Secondary | ICD-10-CM

## 2020-06-15 DIAGNOSIS — T401X1A Poisoning by heroin, accidental (unintentional), initial encounter: Secondary | ICD-10-CM | POA: Insufficient documentation

## 2020-06-15 LAB — RAPID URINE DRUG SCREEN, HOSP PERFORMED
Amphetamines: NOT DETECTED
Barbiturates: NOT DETECTED
Benzodiazepines: NOT DETECTED
Cocaine: POSITIVE — AB
Opiates: NOT DETECTED
Tetrahydrocannabinol: NOT DETECTED

## 2020-06-15 MED ORDER — ONDANSETRON HCL 4 MG/2ML IJ SOLN
INTRAMUSCULAR | Status: AC
  Administered 2020-06-15: 4 mg via INTRAVENOUS
  Filled 2020-06-15: qty 2

## 2020-06-15 MED ORDER — NALOXONE HCL 2 MG/2ML IJ SOSY
PREFILLED_SYRINGE | INTRAMUSCULAR | Status: AC
  Administered 2020-06-15: 2 mg via INTRAVENOUS
  Filled 2020-06-15: qty 2

## 2020-06-15 MED ORDER — NALOXONE HCL 2 MG/2ML IJ SOSY: 2.0000 mg | PREFILLED_SYRINGE | Freq: Once | INTRAMUSCULAR | Status: AC

## 2020-06-15 MED ORDER — SODIUM CHLORIDE 0.9 % IV SOLN: Freq: Once | INTRAVENOUS | Status: AC

## 2020-06-15 MED ORDER — ONDANSETRON HCL 4 MG/2ML IJ SOLN: 4.0000 mg | Freq: Once | INTRAMUSCULAR | Status: AC

## 2020-06-15 NOTE — ED Notes (Signed)
Sister  Sink [phone] would like an update when possible

## 2020-06-15 NOTE — ED Notes (Signed)
Pt verbalizes understanding of d/c instructions. Pt taken to lobby via wheelchair at d/c with all belongings.  

## 2020-06-15 NOTE — ED Notes (Signed)
Pt resting comfortably in bed with eyes closed, no acute distress noted at this time.  

## 2020-06-15 NOTE — ED Provider Notes (Signed)
[facility] EMERGENCY DEPARTMENT Provider Note   CSN: [id number] Arrival date & time: 06/15/20  0044     History Chief Complaint  Patient presents with  . Drug Overdose    Brett Butler is a 55 y.o. male.  The history is provided by the patient and the EMS personnel.  Drug Overdose This is a new problem. The current episode started less than 1 hour ago. The problem occurs constantly. The problem has been rapidly improving. Pertinent negatives include no chest pain, no abdominal pain, no headaches and no shortness of breath. Nothing aggravates the symptoms. Nothing relieves the symptoms. Treatments tried: narcan. The treatment provided significant relief.  Patient with a h/o polysubstance use Snorted heroin just PTA and was unresponsive.  No SOB, no CP, no back pain.  Was only trying to get high.  Denies SI and HI, no AH no VH. Was nauseated and somnolent on arrival and received zofran and then narcan prior to getting a room.     History reviewed. No pertinent past medical history.  Patient Active Problem List   Diagnosis Date Noted  . Schizoaffective disorder, depressive type (HCC) 09/10/2019  . Opioid use disorder, moderate, dependence (HCC) 09/10/2019  . Cannabis use disorder, moderate, dependence (HCC) 09/10/2019  . PTSD (post-traumatic stress disorder) 09/10/2019    Past Surgical History:  Procedure Laterality Date  . ANKLE SURGERY         History reviewed. No pertinent family history.  Social History   Tobacco Use  . Smoking status: Current Every Day Smoker    Types: Cigarettes  Substance Use Topics  . Alcohol use: Yes    Alcohol/week: 10.0 standard drinks    Types: 10 Cans of beer per week  . Drug use: No    Home Medications Prior to Admission medications   Medication Sig Start Date End Date Taking? Authorizing Provider  loratadine (CLARITIN) 10 MG tablet Take 10 mg by mouth daily as needed for allergies.   Yes [provider]   DULoxetine (CYMBALTA) 30 MG capsule Take 1 capsule (30 mg total) by mouth 2 (two) times daily. Patient not taking: Reported on 06/15/2020 09/10/19 09/09/20  Zena Amos, MD  lurasidone (LATUDA) 40 MG TABS tablet Take 1 tablet (40 mg total) by mouth daily with supper. Patient not taking: Reported on 06/15/2020 09/10/19   Zena Amos, MD    Allergies    Other  Review of Systems   Review of Systems  Constitutional: Negative for fever.  HENT: Negative for congestion.   Eyes: Negative for visual disturbance.  Respiratory: Negative for cough and shortness of breath.   Cardiovascular: Negative for chest pain, palpitations and leg swelling.  Gastrointestinal: Negative for abdominal pain.  Genitourinary: Negative for difficulty urinating.  Musculoskeletal: Negative for back pain and neck pain.  Skin: Negative for rash.  Neurological: Negative for facial asymmetry and headaches.  Psychiatric/Behavioral: Negative for agitation.  All other systems reviewed and are negative.   Physical Exam Updated Vital Signs BP (!) 183/86 (BP Location: Right Arm)   Pulse (!) 121   Temp 98.1 F (36.7 C) (Oral)   Resp (!) 21   Ht 5\' 8"  (1.727 m)   Wt 65.8 kg   SpO2 94%   BMI 22.05 kg/m   Physical Exam Vitals and nursing note reviewed.  Constitutional:      General: He is not in acute distress.    Appearance: Normal appearance.  HENT:     Head: Normocephalic and atraumatic.  Nose: Nose normal.  Eyes:     Conjunctiva/sclera: Conjunctivae normal.     Pupils: Pupils are equal, round, and reactive to light.  Cardiovascular:     Rate and Rhythm: Normal rate and regular rhythm.     Pulses: Normal pulses.     Heart sounds: Normal heart sounds.  Musculoskeletal:        General: Normal range of motion.     Cervical back: Normal range of motion and neck supple.  Skin:    General: Skin is warm and dry.     Capillary Refill: Capillary refill takes less than 2 seconds.  Neurological:     General:  No focal deficit present.     Mental Status: He is alert and oriented to person, place, and time.     Deep Tendon Reflexes: Reflexes normal.  Psychiatric:        Mood and Affect: Mood normal.        Behavior: Behavior normal.        Thought Content: Thought content normal. Thought content does not include homicidal or suicidal ideation. Thought content does not include homicidal or suicidal plan.     ED Results / Procedures / Treatments   Labs (all labs ordered are listed, but only abnormal results are displayed) Labs Reviewed - No data to display  EKG None  Radiology No results found.  Procedures Procedures   Medications Ordered in ED Medications  naloxone [facility]) injection 2 mg (2 mg Intravenous Given 06/15/20 0056)  ondansetron (ZOFRAN) injection 4 mg (4 mg Intravenous Given 06/15/20 0056)  0.9 %  sodium chloride infusion ( Intravenous New Bag/Given 06/15/20 0056)    ED Course  I have reviewed the triage vital signs and the nursing notes.  Pertinent labs & imaging results that were available during my care of the patient were reviewed by me and considered in my medical decision making (see chart for details).    Observed in the ED, lungs are clear.  Patient is mentating appropriately.  PO challenged in the ED> Stable for discharge with close follow up.  Please stop using all drugs.    Brett Butler was evaluated in Emergency Department on 06/15/2020 for the symptoms described in the history of present illness. He was evaluated in the context of the global COVID-19 pandemic, which necessitated consideration that the patient might be at risk for infection with the SARS-CoV-2 virus that causes COVID-19. Institutional protocols and algorithms that pertain to the evaluation of patients at risk for COVID-19 are in a state of rapid change based on information released by regulatory bodies including the CDC and federal and state organizations. These policies and algorithms were followed  during the patient's care in the ED.  Final Clinical Impression(s) / ED Diagnoses Return for intractable cough, coughing up blood, fevers >100.4 unrelieved by medication, shortness of breath, intractable vomiting, chest pain, shortness of breath, weakness, numbness, changes in speech, facial asymmetry, abdominal pain, passing out, Inability to tolerate liquids or food, cough, altered mental status or any concerns. No signs of systemic illness or infection. The patient is nontoxic-appearing on exam and vital signs are within normal limits.  I have reviewed the triage vital signs and the nursing notes. Pertinent labs & imaging results that were available during my care of the patient were reviewed by me and considered in my medical decision making (see chart for details). After history, exam, and medical workup I feel the patient has been appropriately medically screened and is safe for  discharge home. Pertinent diagnoses were discussed with the patient. Patient was given return precautions.   Aitan Rossbach, MD 06/15/20 2162

## 2020-06-15 NOTE — ED Triage Notes (Signed)
EMS arrival, heroin overdose time of use unk, per EMS pt walked to a residence asked homeowner to call 911. Per EMS pt RR 10 with periods of apneia upon arrival. 1mg  Nasal Narcan administered, 89% RA, 3L O2 via [place] placed SPO2 improved to 96%. Pt currently 96% RA. GCS 15, pupils 2 round and reactive.

## 2020-09-28 ENCOUNTER — Emergency Department (HOSPITAL_COMMUNITY): Payer: Medicaid Other

## 2020-09-28 ENCOUNTER — Encounter (HOSPITAL_COMMUNITY): Payer: Self-pay

## 2020-09-28 ENCOUNTER — Other Ambulatory Visit: Payer: Self-pay

## 2020-09-28 ENCOUNTER — Inpatient Hospital Stay (HOSPITAL_COMMUNITY)
Admission: EM | Admit: 2020-09-28 | Discharge: 2020-10-22 | DRG: 674 | Disposition: A | Discharge status: Home / Self Care | Payer: Medicaid Other | Attending: Internal Medicine | Admitting: Internal Medicine

## 2020-09-28 DIAGNOSIS — I10 Essential (primary) hypertension: Secondary | ICD-10-CM | POA: Diagnosis present

## 2020-09-28 DIAGNOSIS — I4892 Unspecified atrial flutter: Secondary | ICD-10-CM | POA: Diagnosis not present

## 2020-09-28 DIAGNOSIS — K5900 Constipation, unspecified: Secondary | ICD-10-CM | POA: Diagnosis present

## 2020-09-28 DIAGNOSIS — E871 Hypo-osmolality and hyponatremia: Secondary | ICD-10-CM | POA: Diagnosis present

## 2020-09-28 DIAGNOSIS — Z20822 Contact with and (suspected) exposure to covid-19: Secondary | ICD-10-CM | POA: Diagnosis present

## 2020-09-28 DIAGNOSIS — F1410 Cocaine abuse, uncomplicated: Secondary | ICD-10-CM | POA: Diagnosis present

## 2020-09-28 DIAGNOSIS — E875 Hyperkalemia: Secondary | ICD-10-CM | POA: Diagnosis present

## 2020-09-28 DIAGNOSIS — M6282 Rhabdomyolysis: Secondary | ICD-10-CM | POA: Diagnosis present

## 2020-09-28 DIAGNOSIS — S7012XA Contusion of left thigh, initial encounter: Secondary | ICD-10-CM | POA: Diagnosis present

## 2020-09-28 DIAGNOSIS — I4891 Unspecified atrial fibrillation: Secondary | ICD-10-CM | POA: Diagnosis not present

## 2020-09-28 DIAGNOSIS — I248 Other forms of acute ischemic heart disease: Secondary | ICD-10-CM | POA: Diagnosis present

## 2020-09-28 DIAGNOSIS — M25452 Effusion, left hip: Secondary | ICD-10-CM

## 2020-09-28 DIAGNOSIS — E872 Acidosis: Secondary | ICD-10-CM | POA: Diagnosis present

## 2020-09-28 DIAGNOSIS — X58XXXA Exposure to other specified factors, initial encounter: Secondary | ICD-10-CM | POA: Diagnosis present

## 2020-09-28 DIAGNOSIS — F1210 Cannabis abuse, uncomplicated: Secondary | ICD-10-CM | POA: Diagnosis present

## 2020-09-28 DIAGNOSIS — G5702 Lesion of sciatic nerve, left lower limb: Secondary | ICD-10-CM | POA: Diagnosis not present

## 2020-09-28 DIAGNOSIS — E559 Vitamin D deficiency, unspecified: Secondary | ICD-10-CM | POA: Diagnosis present

## 2020-09-28 DIAGNOSIS — F1110 Opioid abuse, uncomplicated: Secondary | ICD-10-CM | POA: Diagnosis present

## 2020-09-28 DIAGNOSIS — T796XXA Traumatic ischemia of muscle, initial encounter: Secondary | ICD-10-CM | POA: Diagnosis not present

## 2020-09-28 DIAGNOSIS — F17210 Nicotine dependence, cigarettes, uncomplicated: Secondary | ICD-10-CM | POA: Diagnosis present

## 2020-09-28 DIAGNOSIS — E860 Dehydration: Secondary | ICD-10-CM | POA: Diagnosis present

## 2020-09-28 DIAGNOSIS — D72829 Elevated white blood cell count, unspecified: Secondary | ICD-10-CM

## 2020-09-28 DIAGNOSIS — D649 Anemia, unspecified: Secondary | ICD-10-CM | POA: Diagnosis not present

## 2020-09-28 DIAGNOSIS — N179 Acute kidney failure, unspecified: Principal | ICD-10-CM

## 2020-09-28 DIAGNOSIS — E8809 Other disorders of plasma-protein metabolism, not elsewhere classified: Secondary | ICD-10-CM | POA: Diagnosis present

## 2020-09-28 DIAGNOSIS — R34 Anuria and oliguria: Secondary | ICD-10-CM | POA: Diagnosis present

## 2020-09-28 DIAGNOSIS — R600 Localized edema: Secondary | ICD-10-CM | POA: Diagnosis present

## 2020-09-28 DIAGNOSIS — F259 Schizoaffective disorder, unspecified: Secondary | ICD-10-CM | POA: Diagnosis present

## 2020-09-28 DIAGNOSIS — N170 Acute kidney failure with tubular necrosis: Secondary | ICD-10-CM | POA: Diagnosis not present

## 2020-09-28 DIAGNOSIS — R569 Unspecified convulsions: Secondary | ICD-10-CM | POA: Diagnosis not present

## 2020-09-28 DIAGNOSIS — Z79899 Other long term (current) drug therapy: Secondary | ICD-10-CM

## 2020-09-28 DIAGNOSIS — S7402XA Injury of sciatic nerve at hip and thigh level, left leg, initial encounter: Secondary | ICD-10-CM | POA: Diagnosis present

## 2020-09-28 DIAGNOSIS — I480 Paroxysmal atrial fibrillation: Secondary | ICD-10-CM | POA: Diagnosis not present

## 2020-09-28 DIAGNOSIS — F1491 Cocaine use, unspecified, in remission: Secondary | ICD-10-CM | POA: Diagnosis present

## 2020-09-28 DIAGNOSIS — M60862 Other myositis, left lower leg: Secondary | ICD-10-CM | POA: Diagnosis present

## 2020-09-28 HISTORY — DX: Acute kidney failure, unspecified: N17.9

## 2020-09-28 LAB — COMPREHENSIVE METABOLIC PANEL
ALT: 522 U/L — ABNORMAL HIGH (ref 0–44)
AST: 1244 U/L — ABNORMAL HIGH (ref 15–41)
Albumin: 4.1 g/dL (ref 3.5–5.0)
Alkaline Phosphatase: 72 U/L (ref 38–126)
Anion gap: >20 — ABNORMAL HIGH (ref 5–15)
BUN: 34 mg/dL — ABNORMAL HIGH (ref 6–20)
CO2: 19 mmol/L — ABNORMAL LOW (ref 22–32)
Calcium: 8.3 mg/dL — ABNORMAL LOW (ref 8.9–10.3)
Chloride: 96 mmol/L — ABNORMAL LOW (ref 98–111)
Creatinine, Ser: 4.95 mg/dL — ABNORMAL HIGH (ref 0.61–1.24)
GFR, Estimated: 13 mL/min — ABNORMAL LOW (ref >60)
Glucose, Bld: 128 mg/dL — ABNORMAL HIGH (ref 70–99)
Potassium: 6.5 mmol/L (ref 3.5–5.1)
Sodium: 137 mmol/L (ref 135–145)
Total Bilirubin: 0.4 mg/dL (ref 0.3–1.2)
Total Protein: 8.9 g/dL — ABNORMAL HIGH (ref 6.5–8.1)

## 2020-09-28 LAB — CBC WITH DIFFERENTIAL/PLATELET
Abs Immature Granulocytes: 0.43 10*3/uL — ABNORMAL HIGH (ref 0.00–0.07)
Basophils Absolute: 0.1 10*3/uL (ref 0.0–0.1)
Basophils Relative: 0 %
Eosinophils Absolute: 0 10*3/uL (ref 0.0–0.5)
Eosinophils Relative: 0 %
HCT: 57.4 % — ABNORMAL HIGH (ref 39.0–52.0)
Hemoglobin: 18.3 g/dL — ABNORMAL HIGH (ref 13.0–17.0)
Immature Granulocytes: 2 %
Lymphocytes Relative: 6 %
Lymphs Abs: 1.7 10*3/uL (ref 0.7–4.0)
MCH: 31.6 pg (ref 26.0–34.0)
MCHC: 31.9 g/dL (ref 30.0–36.0)
MCV: 99.1 fL (ref 80.0–100.0)
Monocytes Absolute: 1.4 10*3/uL — ABNORMAL HIGH (ref 0.1–1.0)
Monocytes Relative: 5 %
Neutro Abs: 23.6 10*3/uL — ABNORMAL HIGH (ref 1.7–7.7)
Neutrophils Relative %: 87 %
Platelets: 316 10*3/uL (ref 150–400)
RBC: 5.79 MIL/uL (ref 4.22–5.81)
RDW: 14.6 % (ref 11.5–15.5)
WBC: 27.3 10*3/uL — ABNORMAL HIGH (ref 4.0–10.5)
nRBC: 0.1 % (ref 0.0–0.2)

## 2020-09-28 LAB — LACTIC ACID, PLASMA
Lactic Acid, Venous: 5.1 mmol/L (ref 0.5–1.9)
Lactic Acid, Venous: 4.1 mmol/L (ref 0.5–1.9)

## 2020-09-28 LAB — RAPID URINE DRUG SCREEN, HOSP PERFORMED
Amphetamines: NOT DETECTED
Barbiturates: NOT DETECTED
Benzodiazepines: NOT DETECTED
Cocaine: POSITIVE — AB
Opiates: NOT DETECTED
Tetrahydrocannabinol: NOT DETECTED

## 2020-09-28 LAB — ETHANOL: Alcohol, Ethyl (B): <10 mg/dL (ref <10)

## 2020-09-28 LAB — RESP PANEL BY RT-PCR (FLU A&B, COVID) ARPGX2
Influenza A by PCR: NEGATIVE
Influenza B by PCR: NEGATIVE
SARS Coronavirus 2 by RT PCR: NEGATIVE

## 2020-09-28 LAB — C-REACTIVE PROTEIN: CRP: 13.9 mg/dL — ABNORMAL HIGH (ref <1.0)

## 2020-09-28 LAB — SEDIMENTATION RATE: Sed Rate: 16 mm/hr (ref 0–16)

## 2020-09-28 LAB — CK: Total CK: >50000 U/L — ABNORMAL HIGH (ref 49–397)

## 2020-09-28 MED ORDER — SODIUM CHLORIDE 0.9 % IV BOLUS
500.0000 mL | Freq: Once | INTRAVENOUS | Status: AC
  Administered 2020-09-28: 500 mL via INTRAVENOUS

## 2020-09-28 MED ORDER — DEXTROSE 50 % IV SOLN
1.0000 | Freq: Once | INTRAVENOUS | Status: AC
  Administered 2020-09-28: 50 mL via INTRAVENOUS
  Filled 2020-09-28: qty 50

## 2020-09-28 MED ORDER — SODIUM ZIRCONIUM CYCLOSILICATE 10 G PO PACK
10.0000 g | PACK | Freq: Once | ORAL | Status: AC
  Administered 2020-09-28: 10 g via ORAL
  Filled 2020-09-28: qty 1

## 2020-09-28 MED ORDER — LACTATED RINGERS IV SOLN: INTRAVENOUS | Status: DC

## 2020-09-28 MED ORDER — SODIUM CHLORIDE 0.9 % IV SOLN
2.0000 g | Freq: Once | INTRAVENOUS | Status: AC
  Administered 2020-09-28: 2 g via INTRAVENOUS
  Filled 2020-09-28: qty 2

## 2020-09-28 MED ORDER — LACTATED RINGERS IV BOLUS (SEPSIS)
1000.0000 mL | Freq: Once | INTRAVENOUS | Status: AC
  Administered 2020-09-28: 1000 mL via INTRAVENOUS

## 2020-09-28 MED ORDER — SODIUM CHLORIDE 0.9 % IV SOLN: INTRAVENOUS | Status: DC

## 2020-09-28 MED ORDER — INSULIN ASPART 100 UNIT/ML IV SOLN
10.0000 [IU] | Freq: Once | INTRAVENOUS | Status: AC
  Administered 2020-09-28: 10 [IU] via INTRAVENOUS
  Filled 2020-09-28: qty 0.1

## 2020-09-28 MED ORDER — VANCOMYCIN HCL IN DEXTROSE 1-5 GM/200ML-% IV SOLN
1000.0000 mg | Freq: Once | INTRAVENOUS | Status: AC
  Administered 2020-09-28: 1000 mg via INTRAVENOUS
  Filled 2020-09-28: qty 200

## 2020-09-28 MED ORDER — METRONIDAZOLE 500 MG/100ML IV SOLN
500.0000 mg | Freq: Once | INTRAVENOUS | Status: AC
  Administered 2020-09-28: 500 mg via INTRAVENOUS
  Filled 2020-09-28: qty 100

## 2020-09-28 NOTE — ED Provider Notes (Signed)
[facility]-EMERGENCY DEPT Provider Note   CSN: [id number] Arrival date & time: 09/28/20  1149     History Chief Complaint  Patient presents with   Pain    Brett Butler is a 55 y.o. male.  54 year old male who presents with cute onset of lower back pain with associated left leg pain and weakness that is worse with movement.  Patient admits to snorting an unknown substance last p.m.  States has been up for 24 hours.  Patient states that he did fall.  Denies any fever or chills.  States that pain is severe and worse with any movement.  Denies any bowel or bladder dysfunction.  No perineal numbness or tingling.  When EMS was called, patient was found to be minimally responsive.  Was given Narcan with good relief.      History reviewed. No pertinent past medical history.  Patient Active Problem List   Diagnosis Date Noted   Schizoaffective disorder, depressive type (HCC) 09/10/2019   Opioid use disorder, moderate, dependence (HCC) 09/10/2019   Cannabis use disorder, moderate, dependence (HCC) 09/10/2019   PTSD (post-traumatic stress disorder) 09/10/2019    Past Surgical History:  Procedure Laterality Date   ANKLE SURGERY         History reviewed. No pertinent family history.  Social History   Tobacco Use   Smoking status: Every Day    Types: Cigarettes  Substance Use Topics   Alcohol use: Yes    Alcohol/week: 10.0 standard drinks    Types: 10 Cans of beer per week   Drug use: No    Home Medications Prior to Admission medications   Medication Sig Start Date End Date Taking? Authorizing Provider  DULoxetine (CYMBALTA) 30 MG capsule Take 1 capsule (30 mg total) by mouth 2 (two) times daily. Patient not taking: Reported on 06/15/2020 09/10/19 09/09/20  Zena Amos, MD  loratadine (CLARITIN) 10 MG tablet Take 10 mg by mouth daily as needed for allergies.    [provider]  lurasidone (LATUDA) 40 MG TABS tablet Take 1 tablet (40 mg total)  by mouth daily with supper. Patient not taking: Reported on 06/15/2020 09/10/19   Zena Amos, MD    Allergies    Other  Review of Systems   Review of Systems  All other systems reviewed and are negative.  Physical Exam Updated Vital Signs BP 132/83   Pulse (!) 106   Temp 97.8 F (36.6 C) (Oral)   Resp 18   SpO2 97%   Physical Exam Vitals and nursing note reviewed.  Constitutional:      General: He is not in acute distress.    Appearance: Normal appearance. He is well-developed. He is not toxic-appearing.  HENT:     Head: Normocephalic and atraumatic.  Eyes:     General: Lids are normal.     Conjunctiva/sclera: Conjunctivae normal.     Pupils: Pupils are equal, round, and reactive to light.  Neck:     Thyroid: No thyroid mass.     Trachea: No tracheal deviation.  Cardiovascular:     Rate and Rhythm: Normal rate and regular rhythm.     Heart sounds: Normal heart sounds. No murmur heard.   No gallop.  Pulmonary:     Effort: Pulmonary effort is normal. No respiratory distress.     Breath sounds: Normal breath sounds. No stridor. No decreased breath sounds, wheezing, rhonchi or rales.  Abdominal:     General: There is no distension.  Palpations: Abdomen is soft.     Tenderness: There is no abdominal tenderness. There is no rebound.  Musculoskeletal:        General: No tenderness. Normal range of motion.     Cervical back: Normal range of motion and neck supple.     Comments: Pain along lower lumbar spine.  Pain with movement of left lower extremity.  Left thigh compartment soft.  Pain to palpation along thigh however.  Dorsalis pedis pulses weak on left foot.  No withdrawal to painful stimuli on left foot.  Cap refills less than 4 seconds.  Skin:    General: Skin is warm and dry.     Findings: No abrasion or rash.  Neurological:     Mental Status: He is alert and oriented to person, place, and time. Mental status is at baseline.     GCS: GCS eye subscore is 4. GCS  verbal subscore is 5. GCS motor subscore is 6.     Cranial Nerves: Cranial nerves are intact. No cranial nerve deficit.     Sensory: No sensory deficit.     Motor: Motor function is intact.  Psychiatric:        Attention and Perception: Attention normal.        Speech: Speech normal.        Behavior: Behavior normal.    ED Results / Procedures / Treatments   Labs (all labs ordered are listed, but only abnormal results are displayed) Labs Reviewed  CBC WITH DIFFERENTIAL/PLATELET  COMPREHENSIVE METABOLIC PANEL  RAPID URINE DRUG SCREEN, HOSP PERFORMED  ETHANOL    EKG None  Radiology No results found.  Procedures Procedures   Medications Ordered in ED Medications  0.9 %  sodium chloride infusion (has no administration in time range)    ED Course  I have reviewed the triage vital signs and the nursing notes.  Pertinent labs & imaging results that were available during my care of the patient were reviewed by me and considered in my medical decision making (see chart for details).    MDM Rules/Calculators/A&P                           Patient presented with severe pain to his left leg.  Upon further questioning, he admits that he has been using copious muscle cocaine.  Patient states he passed out on his left side.  He had no signs of fascial compromise but did have decreased neurological function of his left foot.  Concern for possible skeletal injury negative x-rays were resulted for his hip and lower back.  Laboratory studies were significant for elevated lactate, leukocytosis as well as acute kidney injury with hyperkalemia.  Patient hyperkalemia order set started.  Sepsis protocol was also started 2.  Noncontrast CT of patient's extremity showed findings suspicious for increased edema and possible compartment syndrome.  Orthopedic consultation with Dr. Linna Caprice who has come to see the patient.  Patient had MRI of his extremity which had no signs of abscess.  Patient CK did  come back over 50,000.  This is consistent with his suspected rhabdomyolysis from likely crush injury.  He also has a significant transaminitis 2.  Also from his rhabdo.  Discussed with Dr. Toniann Fail who will admit to the hospitalist service.  Dr. Linna Caprice will will decide next steps concerning patient's left lower extremity weakness which could be from palsy versus compartment syndrome.  CRITICAL CARE Performed by: Toy Baker Total critical  care time: 75 minutes Critical care time was exclusive of separately billable procedures and treating other patients. Critical care was necessary to treat or prevent imminent or life-threatening deterioration. Critical care was time spent personally by me on the following activities: development of treatment plan with patient and/or surrogate as well as nursing, discussions with consultants, evaluation of patient's response to treatment, examination of patient, obtaining history from patient or surrogate, ordering and performing treatments and interventions, ordering and review of laboratory studies, ordering and review of radiographic studies, pulse oximetry and re-evaluation of patient's condition.  Final Clinical Impression(s) / ED Diagnoses Final diagnoses:  None    Rx / DC Orders ED Discharge Orders     None        Lorre Nick, MD 09/28/20 2322

## 2020-09-28 NOTE — Sepsis Progress Note (Signed)
Following per sepsis protocol   

## 2020-09-28 NOTE — ED Notes (Signed)
Patient in MRI 

## 2020-09-28 NOTE — ED Triage Notes (Signed)
Pt arrived via EMS, from private residence. Per EMS, pt was very lethargic upon arrival, given 0.5 narcan with good effect. C/o left leg pain and inability to move it in triage.

## 2020-09-28 NOTE — Progress Notes (Signed)
A consult was received from an ED physician for vancomycin and cefepime per pharmacy dosing (for an indication other than meningitis). The patient's profile has been reviewed for ht/wt/allergies/indication/available labs. A one time order has been placed for the above antibiotics.  Further antibiotics/pharmacy consults should be ordered by admitting physician if indicated.                       Bernadene Person, PharmD, BCPS [phone] 09/28/2020, 9:34 PM

## 2020-09-28 NOTE — ED Provider Notes (Signed)
I was asked to facilitate ED to ED transfer for this patient.  Recent cocaine binge, back pain, leg pain, rhabdomyolysis, functional loss of the extremity, orthopedics recommending emergent MR lumbar spine.  To obtain this study, will need to be transferred to [facility].  Dr. Lockie Mola accepts for transfer.   Sabas Sous, MD 09/28/20 [phone]

## 2020-09-28 NOTE — Consult Note (Signed)
ORTHOPAEDIC CONSULTATION  REQUESTING PHYSICIAN: Eduard Clos, MD  PCP:  Lavinia Sharps, NP  Chief Complaint: R/o compartment syndrome of left thigh  HPI: Brett Butler is a 55 y.o. male who snorted cocaine yesterday.  He reports that he passed out for about 12 hours.  EMS was called, and he was found to be minimally responsive.  He was given Narcan and woke up.  It was likely that he was in the same position for a prolonged period of time.  He has severe low back pain and left lower extremity pain.  He reports weakness and difficulty moving the lower extremity.  In the emergency department, he had CT scan of the pelvis, left hip, and left femur that showed muscular edema but no abscess.  Orthopedic consultation was placed to rule out compartment syndrome of the left thigh.  History reviewed. No pertinent past medical history. Past Surgical History:  Procedure Laterality Date   ANKLE SURGERY     Social History   Socioeconomic History   Marital status: Legally Separated    Spouse name: Not on file   Number of children: Not on file   Years of education: Not on file   Highest education level: Not on file  Occupational History   Not on file  Tobacco Use   Smoking status: Every Day    Types: Cigarettes   Smokeless tobacco: Not on file  Substance and Sexual Activity   Alcohol use: Yes    Alcohol/week: 10.0 standard drinks    Types: 10 Cans of beer per week   Drug use: No   Sexual activity: Not on file  Other Topics Concern   Not on file  Social History Narrative   Not on file   Social Determinants of Health   Financial Resource Strain: Not on file  Food Insecurity: Not on file  Transportation Needs: Not on file  Physical Activity: Not on file  Stress: Not on file  Social Connections: Not on file   History reviewed. No pertinent family history. Allergies  Allergen Reactions   Other Hives and Rash    peaches   Prior to Admission medications    Medication Sig Start Date End Date Taking? Authorizing Provider  DULoxetine (CYMBALTA) 30 MG capsule Take 1 capsule (30 mg total) by mouth 2 (two) times daily. Patient not taking: Reported on 06/15/2020 09/10/19 09/09/20  Zena Amos, MD  loratadine (CLARITIN) 10 MG tablet Take 10 mg by mouth daily as needed for allergies.    [provider]  lurasidone (LATUDA) 40 MG TABS tablet Take 1 tablet (40 mg total) by mouth daily with supper. 09/10/19   Zena Amos, MD   CT Abdomen Pelvis Wo Contrast  Result Date: 09/28/2020 CLINICAL DATA:  Left leg pain EXAM: CT ABDOMEN AND PELVIS WITHOUT CONTRAST TECHNIQUE: Multidetector CT imaging of the abdomen and pelvis was performed following the standard protocol without IV contrast. COMPARISON:  None. FINDINGS: LOWER CHEST: Bibasilar atelectasis HEPATOBILIARY: Normal hepatic contours. No intra- or extrahepatic biliary dilatation. The gallbladder is normal. PANCREAS: Normal pancreas. No ductal dilatation or peripancreatic fluid collection. SPLEEN: Normal. ADRENALS/URINARY TRACT: The adrenal glands are normal. No hydronephrosis, nephroureterolithiasis or solid renal mass. The urinary bladder is normal for degree of distention STOMACH/BOWEL: There is no hiatal hernia. Normal duodenal course and caliber. No small bowel dilatation or inflammation. No focal colonic abnormality. Normal appendix. VASCULAR/LYMPHATIC: There is calcific atherosclerosis of the abdominal aorta. No lymphadenopathy. REPRODUCTIVE: Normal prostate size with symmetric seminal  vesicles. MUSCULOSKELETAL. No bony spinal canal stenosis or focal osseous abnormality. OTHER: Large intramuscular hematoma the left thigh is better characterized on concomitant CT of the left lower extremity. IMPRESSION: 1. Large intramuscular hematoma the left thigh is better characterized on concomitant CT of the left lower extremity. 2. No acute abnormality of the abdomen or pelvis. Aortic Atherosclerosis (ICD10-I70.0).  Electronically Signed   By: Deatra Robinson M.D.   On: 09/28/2020 20:41   DG Lumbar Spine Complete  Result Date: 09/28/2020 CLINICAL DATA:  Fall with left leg pain. EXAM: LUMBAR SPINE - COMPLETE 4+ VIEW COMPARISON:  Remote radiograph 04/24/2008 FINDINGS: Again seen transitional lumbosacral anatomy. There is no evidence of fracture. Normal alignment. Vertebral body heights are normal. Minor endplate spurring at multiple levels. Disc space narrowing at the lumbosacral junction. Remaining disc spaces are preserved. Minor lower lumbar facet hypertrophy. Aorto bi-iliac atherosclerosis is age advanced. IMPRESSION: 1. No acute fracture or subluxation of the lumbar spine. 2. Mild spondylosis with endplate spurring and facet hypertrophy. 3. Age advanced aortoiliac atherosclerosis. Electronically Signed   By: Narda Rutherford M.D.   On: 09/28/2020 17:10   CT EXTREMITY LOWER LEFT WO CONTRAST  Result Date: 09/28/2020 CLINICAL DATA:  Left leg pain and swelling. EXAM: CT OF THE LOWER LEFT EXTREMITY WITHOUT CONTRAST TECHNIQUE: Multidetector CT imaging of the lower left extremity was performed according to the standard protocol. COMPARISON:  None. FINDINGS: There is marked enlargement of the left thigh. the left adductor muscle compartment is markedly enlarged and the muscles appear edematous. There is also surrounding inflammatory changes and fluid. This process continues down into the posterior compartment of the thigh and terminates just above the knee. There is also some associated subcutaneous soft tissue swelling/edema/fluid. The anterior compartment of the knee is unremarkable. I do not see a discrete intramuscular hematoma. This has more the appearance of edema/myositis. Findings could be due to viral myositis, other post infectious myositis, muscle infarcts or drug related. Could not exclude compartment syndrome given the amount of swelling and edema. However, this is a clinical diagnosis. I do not see any  significant findings below the knee. The femur is intact. The tibia and fibula are intact. Joint spaces are maintained. No findings suspicious for septic arthritis or osteomyelitis. Scattered arterial calcifications. IMPRESSION: 1. Marked enlargement of the left adductor and hamstring muscle compartments with surrounding inflammatory changes and intermuscular fluid. This has the appearance of edema/myositis. No definite hematoma. Findings could be due to viral myositis, other post infectious myositis, muscle infarcts or drug related. Could not exclude compartment syndrome given the amount of swelling and edema. However, this is a clinical diagnosis. 2. No significant bony findings. Fracture, septic arthritis or osteomyelitis. Electronically Signed   By: Rudie Meyer M.D.   On: 09/28/2020 20:44   DG Hip Unilat W or Wo Pelvis 2-3 Views Left  Result Date: 09/28/2020 CLINICAL DATA:  Fall with left leg pain. EXAM: DG HIP (WITH OR WITHOUT PELVIS) 2-3V LEFT COMPARISON:  None. FINDINGS: There is mild left hip joint space narrowing and acetabular spurring. The femoral head is well seated. There is no evidence of fracture. The pubic rami are intact. Pubic symphysis and sacroiliac joints are congruent. No evidence of a vascular necrosis or focal bone abnormality. Vascular calcifications are seen. IMPRESSION: Mild left hip osteoarthritis. No acute fracture. Electronically Signed   By: Narda Rutherford M.D.   On: 09/28/2020 17:13    Positive ROS: All other systems have been reviewed and were otherwise negative with the  exception of those mentioned in the HPI and as above.  Physical Exam: General: Alert, no acute distress Cardiovascular: No pedal edema Respiratory: No cyanosis, no use of accessory musculature GI: No organomegaly, abdomen is soft and non-tender Skin: No lesions in the area of chief complaint Neurologic: Sensation intact distally Psychiatric: Patient is competent for consent with normal mood and  affect Lymphatic: No axillary or cervical lymphadenopathy  MUSCULOSKELETAL: Examination of the left lower extremity reveals no skin wounds or lesions.  There is no erythema or evidence of cellulitis.  He has swelling of the proximal thigh.  He has diffuse tenderness to palpation of the thigh.  There is no significant pain with passive range of motion of the knee and hip.  He denies the ability to actively move his toes or ankle.  He cannot do a straight leg raise secondary to pain.  He reports subjective sensory change in the entire lower extremity.  He has 2+ DP and PT pulses.  Assessment: Left thigh swelling, rhabdomyolysis from prolonged immobility in the same position Exam equivocal for impending compartment syndrome.  Plan: I discussed the findings with the patient.  He has an equivocal exam for compartment syndrome of the thigh.  Therefore, I recommend obtaining compartment pressures at the bedside.  I did obtain verbal consent.  The thigh was prepped with ChloraPrep solution.  Using sterile technique, the Stryker compartment pressure monitor was used to obtain compartment pressures of all 3 compartments in the proximal aspect of the thigh in the area of maximal swelling.  Findings include: anterior compartment pressure 8 mmHg, adductor compartment pressure 8 mmHg, and posterior compartment pressure 10 mmHg.  At the time of compartment pressure monitoring, his blood pressure was 163/116.  These values are benign.  Therefore, he does not have compartment syndrome.  I recommend supportive care for his rhabdomyolysis and lower extremity swelling to include elevation and ice.  Due to his difficulty moving the lower extremity, I do recommend MRI of the lumbar spine for further evaluation.  There is no evidence of left lower extremity infection, and no orthopedic surgical indication.  I discussed these findings with Dr. Toniann Fail.     Jonette Pesa, MD [phone]    09/28/2020 11:32 PM

## 2020-09-29 ENCOUNTER — Inpatient Hospital Stay (HOSPITAL_COMMUNITY): Payer: Medicaid Other

## 2020-09-29 ENCOUNTER — Encounter (HOSPITAL_COMMUNITY): Payer: Self-pay | Admitting: Internal Medicine

## 2020-09-29 DIAGNOSIS — D72829 Elevated white blood cell count, unspecified: Secondary | ICD-10-CM

## 2020-09-29 DIAGNOSIS — E875 Hyperkalemia: Secondary | ICD-10-CM

## 2020-09-29 DIAGNOSIS — S7402XA Injury of sciatic nerve at hip and thigh level, left leg, initial encounter: Secondary | ICD-10-CM

## 2020-09-29 DIAGNOSIS — G5702 Lesion of sciatic nerve, left lower limb: Secondary | ICD-10-CM

## 2020-09-29 DIAGNOSIS — M6282 Rhabdomyolysis: Secondary | ICD-10-CM | POA: Diagnosis present

## 2020-09-29 DIAGNOSIS — N179 Acute kidney failure, unspecified: Principal | ICD-10-CM

## 2020-09-29 DIAGNOSIS — T796XXA Traumatic ischemia of muscle, initial encounter: Secondary | ICD-10-CM

## 2020-09-29 DIAGNOSIS — F1491 Cocaine use, unspecified, in remission: Secondary | ICD-10-CM | POA: Diagnosis present

## 2020-09-29 DIAGNOSIS — F1410 Cocaine abuse, uncomplicated: Secondary | ICD-10-CM | POA: Diagnosis present

## 2020-09-29 HISTORY — DX: Hyperkalemia: E87.5

## 2020-09-29 HISTORY — DX: Rhabdomyolysis: M62.82

## 2020-09-29 LAB — BASIC METABOLIC PANEL
Anion gap: 13 (ref 5–15)
Anion gap: 16 — ABNORMAL HIGH (ref 5–15)
BUN: 45 mg/dL — ABNORMAL HIGH (ref 6–20)
BUN: 55 mg/dL — ABNORMAL HIGH (ref 6–20)
CO2: 21 mmol/L — ABNORMAL LOW (ref 22–32)
CO2: 23 mmol/L (ref 22–32)
Calcium: 6.7 mg/dL — ABNORMAL LOW (ref 8.9–10.3)
Calcium: 6.6 mg/dL — ABNORMAL LOW (ref 8.9–10.3)
Chloride: 99 mmol/L (ref 98–111)
Chloride: 93 mmol/L — ABNORMAL LOW (ref 98–111)
Creatinine, Ser: 5.53 mg/dL — ABNORMAL HIGH (ref 0.61–1.24)
Creatinine, Ser: 6.88 mg/dL — ABNORMAL HIGH (ref 0.61–1.24)
GFR, Estimated: 11 mL/min — ABNORMAL LOW (ref >60)
GFR, Estimated: 9 mL/min — ABNORMAL LOW (ref >60)
Glucose, Bld: 134 mg/dL — ABNORMAL HIGH (ref 70–99)
Glucose, Bld: 112 mg/dL — ABNORMAL HIGH (ref 70–99)
Potassium: 6.7 mmol/L (ref 3.5–5.1)
Potassium: 5.2 mmol/L — ABNORMAL HIGH (ref 3.5–5.1)
Sodium: 133 mmol/L — ABNORMAL LOW (ref 135–145)
Sodium: 132 mmol/L — ABNORMAL LOW (ref 135–145)

## 2020-09-29 LAB — CBC WITH DIFFERENTIAL/PLATELET
Abs Immature Granulocytes: 0.16 10*3/uL — ABNORMAL HIGH (ref 0.00–0.07)
Basophils Absolute: 0 10*3/uL (ref 0.0–0.1)
Basophils Relative: 0 %
Eosinophils Absolute: 0 10*3/uL (ref 0.0–0.5)
Eosinophils Relative: 0 %
HCT: 40.8 % (ref 39.0–52.0)
Hemoglobin: 14.1 g/dL (ref 13.0–17.0)
Immature Granulocytes: 1 %
Lymphocytes Relative: 6 %
Lymphs Abs: 1.4 10*3/uL (ref 0.7–4.0)
MCH: 32 pg (ref 26.0–34.0)
MCHC: 34.6 g/dL (ref 30.0–36.0)
MCV: 92.7 fL (ref 80.0–100.0)
Monocytes Absolute: 1.5 10*3/uL — ABNORMAL HIGH (ref 0.1–1.0)
Monocytes Relative: 6 %
Neutro Abs: 20.9 10*3/uL — ABNORMAL HIGH (ref 1.7–7.7)
Neutrophils Relative %: 87 %
Platelets: 213 10*3/uL (ref 150–400)
RBC: 4.4 MIL/uL (ref 4.22–5.81)
RDW: 13.9 % (ref 11.5–15.5)
WBC: 24 10*3/uL — ABNORMAL HIGH (ref 4.0–10.5)
nRBC: 0 % (ref 0.0–0.2)

## 2020-09-29 LAB — HEPATITIS PANEL, ACUTE
HCV Ab: NONREACTIVE
Hep A IgM: NONREACTIVE
Hep B C IgM: NONREACTIVE
Hepatitis B Surface Ag: NONREACTIVE

## 2020-09-29 LAB — HIV ANTIBODY (ROUTINE TESTING W REFLEX): HIV Screen 4th Generation wRfx: NONREACTIVE

## 2020-09-29 LAB — HEPATIC FUNCTION PANEL
ALT: 412 U/L — ABNORMAL HIGH (ref 0–44)
AST: 1038 U/L — ABNORMAL HIGH (ref 15–41)
Albumin: 2.5 g/dL — ABNORMAL LOW (ref 3.5–5.0)
Alkaline Phosphatase: 49 U/L (ref 38–126)
Bilirubin, Direct: <0.1 mg/dL (ref 0.0–0.2)
Total Bilirubin: 0.8 mg/dL (ref 0.3–1.2)
Total Protein: 5.6 g/dL — ABNORMAL LOW (ref 6.5–8.1)

## 2020-09-29 LAB — TROPONIN I (HIGH SENSITIVITY)
Troponin I (High Sensitivity): 140 ng/L (ref <18)
Troponin I (High Sensitivity): 120 ng/L (ref <18)

## 2020-09-29 LAB — LACTIC ACID, PLASMA
Lactic Acid, Venous: 2.5 mmol/L (ref 0.5–1.9)
Lactic Acid, Venous: 2.3 mmol/L (ref 0.5–1.9)

## 2020-09-29 LAB — POTASSIUM: Potassium: 5.4 mmol/L — ABNORMAL HIGH (ref 3.5–5.1)

## 2020-09-29 LAB — ACETAMINOPHEN LEVEL: Acetaminophen (Tylenol), Serum: <10 ug/mL — ABNORMAL LOW (ref 10–30)

## 2020-09-29 MED ORDER — SODIUM ZIRCONIUM CYCLOSILICATE 10 G PO PACK
10.0000 g | PACK | Freq: Once | ORAL | Status: AC
  Administered 2020-09-29: 10 g via ORAL
  Filled 2020-09-29: qty 1

## 2020-09-29 MED ORDER — DEXTROSE 50 % IV SOLN
1.0000 | Freq: Once | INTRAVENOUS | Status: AC
  Administered 2020-09-29: 50 mL via INTRAVENOUS
  Filled 2020-09-29: qty 50

## 2020-09-29 MED ORDER — LACTATED RINGERS IV SOLN: INTRAVENOUS | Status: DC

## 2020-09-29 MED ORDER — INSULIN ASPART 100 UNIT/ML IV SOLN
10.0000 [IU] | Freq: Once | INTRAVENOUS | Status: AC
  Administered 2020-09-29: 10 [IU] via INTRAVENOUS

## 2020-09-29 MED ORDER — LACTATED RINGERS IV SOLN: INTRAVENOUS | Status: AC

## 2020-09-29 MED ORDER — SODIUM BICARBONATE 8.4 % IV SOLN
INTRAVENOUS | Status: DC
  Filled 2020-09-29 (×2): qty 1000
  Filled 2020-09-29: qty 150

## 2020-09-29 MED ORDER — CALCIUM GLUCONATE-NACL 1-0.675 GM/50ML-% IV SOLN
1.0000 g | Freq: Once | INTRAVENOUS | Status: AC
  Administered 2020-09-29: 1000 mg via INTRAVENOUS
  Filled 2020-09-29: qty 50

## 2020-09-29 MED ORDER — OXYCODONE HCL 5 MG PO TABS
5.0000 mg | ORAL_TABLET | ORAL | Status: DC | PRN
  Administered 2020-09-29: 10 mg via ORAL
  Administered 2020-09-30: 5 mg via ORAL
  Administered 2020-09-30 – 2020-10-02 (×4): 10 mg via ORAL
  Administered 2020-10-02: 5 mg via ORAL
  Administered 2020-10-02 – 2020-10-06 (×10): 10 mg via ORAL
  Administered 2020-10-06: 5 mg via ORAL
  Administered 2020-10-06 – 2020-10-07 (×2): 10 mg via ORAL
  Administered 2020-10-07 (×2): 5 mg via ORAL
  Administered 2020-10-08: 10 mg via ORAL
  Administered 2020-10-08: 5 mg via ORAL
  Administered 2020-10-09 (×2): 10 mg via ORAL
  Administered 2020-10-10: 5 mg via ORAL
  Administered 2020-10-10 – 2020-10-13 (×11): 10 mg via ORAL
  Administered 2020-10-13: 5 mg via ORAL
  Administered 2020-10-13: 10 mg via ORAL
  Administered 2020-10-14: 5 mg via ORAL
  Administered 2020-10-14: 10 mg via ORAL
  Administered 2020-10-15: 5 mg via ORAL
  Administered 2020-10-15 – 2020-10-22 (×28): 10 mg via ORAL
  Filled 2020-09-29 (×20): qty 2
  Filled 2020-09-29: qty 1
  Filled 2020-09-29: qty 2
  Filled 2020-09-29: qty 1
  Filled 2020-09-29 (×7): qty 2
  Filled 2020-09-29: qty 1
  Filled 2020-09-29 (×9): qty 2
  Filled 2020-09-29: qty 1
  Filled 2020-09-29 (×3): qty 2
  Filled 2020-09-29: qty 1
  Filled 2020-09-29 (×9): qty 2
  Filled 2020-09-29: qty 1
  Filled 2020-09-29 (×6): qty 2
  Filled 2020-09-29: qty 1
  Filled 2020-09-29 (×4): qty 2
  Filled 2020-09-29: qty 1
  Filled 2020-09-29: qty 2
  Filled 2020-09-29: qty 1
  Filled 2020-09-29 (×2): qty 2

## 2020-09-29 MED ORDER — FUROSEMIDE 10 MG/ML IJ SOLN
80.0000 mg | Freq: Once | INTRAMUSCULAR | Status: AC
  Administered 2020-09-29: 80 mg via INTRAVENOUS
  Filled 2020-09-29: qty 8

## 2020-09-29 NOTE — Progress Notes (Signed)
Please see note by Dr. Derry Lory.   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  Appreciate orthopedic evaluation.  Depending on the degree of injury, there is a possibility that he could have some recovery over time if the nerve sheath is intact.  He will need physical therapy.  Neurology be available on an as-needed basis moving forward.  Ritta Slot, MD [facility] [phone]  If 7pm- 7am, please page neurology on call as listed in AMION.

## 2020-09-29 NOTE — Consult Note (Signed)
NEUROLOGY CONSULTATION NOTE   Date of service: September 29, 2020 Patient Name: Brett Butler MRN:  [id number] DOB:  02/03/1966 Reason for consult: "Left leg weakness" Requesting Provider: Eduard Clos, MD _ _ _   _ __   _ __ _ _  __ __   _ __   __ _  History of Present Illness  Brett Butler is a 55 y.o. male with PMH significant for cocaine use who presents with low back pain and LLE weakness. He snorted cocaine and passed out for a prolonged amount of time. EMS called and he was minimally responsive. He woke up with narcan. He had severe back and LLE pain with difficulty moving LLE. He reports that when he woke up, he was on his knees with his head titled over a coffee table.  He has not urinated since he woke up, was able to self initiate bowel movement and pass stools himself with no issues, no genital numbness.  Workup with CK > 50,000 consistent with potential rhabdomyolysis and AKI with elevated Creatinine. CT A/P and Left femur with marked enlargement of the left adductor and hamstring muscle compartments with surrounding inflammatory changes and intermuscular fluid. He was evaluated by Ortho for concern for potential compartment syndrome and they measured pressure in all 3 compartments of his thigh and was not consistent with compartment syndrome. Concern was raised for potential lumbar spine involvement and he was transferred to [facility] overnight for STAT MRI Lumbar spine.    ROS   Constitutional Denies weight loss, fever and chills.   HEENT Denies changes in vision and hearing.   Respiratory Denies SOB and cough.   CV Denies palpitations and CP   GI Denies abdominal pain, nausea, vomiting and diarrhea.   GU Denies dysuria and urinary frequency.   MSK Endorses myalgia and joint pain.   Skin Denies rash and pruritus.  Neurological Denies headache and syncope.  Psychiatric Denies recent changes in mood. Denies anxiety and depression.   Past History  History reviewed. No  pertinent past medical history. Past Surgical History:  Procedure Laterality Date  . ANKLE SURGERY     History reviewed. No pertinent family history. Social History   Socioeconomic History  . Marital status: Legally Separated    Spouse name: Not on file  . Number of children: Not on file  . Years of education: Not on file  . Highest education level: Not on file  Occupational History  . Not on file  Tobacco Use  . Smoking status: Every Day    Types: Cigarettes  . Smokeless tobacco: Never  Substance and Sexual Activity  . Alcohol use: Yes    Alcohol/week: 10.0 standard drinks    Types: 10 Cans of beer per week  . Drug use: Yes    Types: Cocaine  . Sexual activity: Not on file  Other Topics Concern  . Not on file  Social History Narrative  . Not on file   Social Determinants of Health   Financial Resource Strain: Not on file  Food Insecurity: Not on file  Transportation Needs: Not on file  Physical Activity: Not on file  Stress: Not on file  Social Connections: Not on file   Allergies  Allergen Reactions  . Other Hives and Rash    peaches    Medications  (Not in a hospital admission)    Vitals   Vitals:   09/28/20 2000 09/28/20 2030 09/28/20 2100 09/28/20 2130  BP: (!) 121/92 110/86 (!) 160/103 Marland Kitchen)  163/116  Pulse: (!) 105 (!) 104 94 (!) 106  Resp: 17 19 (!) 24 20  Temp:      TempSrc:      SpO2: 95% 95% 97% 96%     There is no height or weight on file to calculate BMI.  Physical Exam   General: Laying comfortably in bed; in no acute distress. HENT: Normal oropharynx and mucosa. Normal external appearance of ears and nose. Neck: Supple, no pain or tenderness CV: No JVD. No peripheral edema.  Pulmonary: Symmetric Chest rise. Normal respiratory effort.  Abdomen: Soft to touch, non-tender.  Ext: No cyanosis, edema, or deformity  Skin: No rash. Normal palpation of skin.   Musculoskeletal: Normal digits and nails by inspection. No clubbing.    Neurologic Examination  Mental status/Cognition: Alert, oriented to self, place, month and year, good attention.  Speech/language: Fluent, comprehension intact, object naming intact, repetition intact.  Cranial nerves:   CN II Pupils equal and reactive to light, no VF deficits    CN III,IV,VI EOM intact, no gaze preference or deviation, no nystagmus    CN V normal sensation in V1, V2, and V3 segments bilaterally    CN VII no asymmetry, no nasolabial fold flattening    CN VIII normal hearing to speech    CN IX & X normal palatal elevation, no uvular deviation    CN XI 5/5 head turn and 5/5 shoulder shrug bilaterally    CN XII midline tongue protrusion    Motor:  Muscle bulk: normal, tone normal, pronator drift none tremor none Mvmt Root Nerve  Muscle Right Left Comments  SA C5/6 Ax Deltoid     EF C5/6 Mc Biceps 5 5   EE C6/7/8 Rad Triceps 5 5   WF C6/7 Med FCR     WE C7/8 PIN ECU     F Ab C8/T1 U ADM/FDI 5 5   HF L1/2/3 Fem Illopsoas 5 2   KE L2/3/4 Fem Quad 5 2   DF L4/5 D Peron Tib Ant 5 0   PF S1/2 Tibial Grc/Sol 5 0    Reflexes:  Right Left Comments  Pectoralis      Biceps (C5/6) 2+ 2+   Brachioradialis (C5/6) 3 3    Triceps (C6/7) 2+ 2+    Patellar (L3/4) 3 3    Achilles (S1) 2 2    Hoffman - -    Plantar mute mute   Jaw jerk    Sensation:  Light touch Decreased on the lateral aspect of the left leg from knee below, lateral aspect of the left foot and in between great toe and second toe.   Pin prick Decreased on the lateral aspect of the left leg from knee below, lateral aspect of the left foot and inbetween great toe and second toe.   Temperature    Vibration   Proprioception    Coordination/Complex Motor:  - Finger to Nose intact BL - Heel to shin unable to do 2/2 pain and weakness. - Rapid alternating movement are normal - Gait: unsafe to assess.  Labs   CBC:  Recent Labs  Lab 09/28/20 1617  WBC 27.3*  NEUTROABS 23.6*  HGB 18.3*  HCT 57.4*  MCV  99.1  PLT 316    Basic Metabolic Panel:  Lab Results  Component Value Date   NA 137 09/28/2020   K 6.5 (HH) 09/28/2020   CO2 19 (L) 09/28/2020   GLUCOSE 128 (H) 09/28/2020   BUN 34 (H) 09/28/2020  CREATININE 4.95 (H) 09/28/2020   CALCIUM 8.3 (L) 09/28/2020   GFRNONAA 13 (L) 09/28/2020   GFRAA >60 07/30/2019   Lipid Panel: No results found for: LDLCALC HgbA1c: No results found for: HGBA1C Urine Drug Screen:     Component Value Date/Time   LABOPIA NONE DETECTED 09/28/2020 1827   COCAINSCRNUR POSITIVE (A) 09/28/2020 1827   LABBENZ NONE DETECTED 09/28/2020 1827   AMPHETMU NONE DETECTED 09/28/2020 1827   THCU NONE DETECTED 09/28/2020 1827   LABBARB NONE DETECTED 09/28/2020 1827    Alcohol Level     Component Value Date/Time   ETH <10 09/28/2020 1617    CT Head without contrast: Personally reviewed and CTH was negative for a large hypodensity concerning for a large territory infarct or hyperdensity concerning for an ICH  MR Lumbar spine w/o contrast: Personally reviewed and no canal stenosis, no foraminal narrowing.  MRI Pelvis: Pending.   Impression   Brett Butler is a 55 y.o. male with PMH significant for cocaine use who passed out after using cocaine and was woken up by EMS 12 + hours later and found to have low back pain and LLE weakness. His neurologic examination is notable for weakness in LLE along with sensory deficit in left lateral leg and left lateral foot. He is unable to dorsi or planter flex at ankle, unable to move his toes, unable to do inversion or eversion of his foot. He is able to flex a little at his left knee and flex a little at his hip. He also has sensory deficit in the lateral aspect of the left leg from knee below, lateral aspect of the left foot and in between great toe and second toe.  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 I suspect that this is likely direct compression of the  sciatic nerve during the prolonged period of unresponsiveness. Althou, MRI Left thigh and pelvis will help to rule out compression due to muscle edema in the thigh vs piriformis syndrome.  MRI of his lumbar spine did not show cauda equina syndrome with no significant canal stenosis, no foraminal narrowing.  Recommendations  - In addition to the MRI of his Lumbar spine, will get MRI pelvis to assess for piriformis syndrome. - If the MRI pelvis does show piriformis syndrome, recommend discussion with ortho regarding management. - Agree with supportive management with fluids and PT/OT.  ______________________________________________________________________   Thank you for the opportunity to take part in the care of this patient. If you have any further questions, please contact the neurology consultation attending.  Signed,  Erick Blinks [facility] Pager Number [phone] _ _ _   _ __   _ __ _ _  __ __   _ __   __ _

## 2020-09-29 NOTE — ED Notes (Signed)
[facility] at bedside, report tiven to [employer].

## 2020-09-29 NOTE — Progress Notes (Addendum)
[facility]opaedic [facility]uma [facility]      Have discussed case with Dr. Linna Caprice, reviewed studies and examined patient (+ swelling L thigh, swelling and tenderness to L gluteal region, no left ankle or toe extension, weak hamstring function, severely diminished sensation left distal leg compared to R leg. + DP pulse noted to L leg)  Pt supposedly went on a cocaine binge 09/27/2020, passed out and was on the floor for likely >12 hours. Presented to the [facility] ED around 11:50 on 09/28/2020  Suspect L sciatic nv deficit related to gluteal edema and myonecrosis Nerve injury likely irreversible given prolonged compression/ischemia  Not clear that surgery would be of benefit at this time given time frame of events and potentially increases risk of complications  Significant muscle involvement present which has already resulted in significant renal injury   Recommend consultation with nephrology   Mearl Latin, PA-C [phone] (C) 09/29/2020, 4:23 PM  [facility] [address] [place] [address] [phone] Collier Bullock (F)

## 2020-09-29 NOTE — Consult Note (Signed)
Reason for Consult: Acute kidney injury Referring Physician: Jacquelin Hawking MD [facility])  HPI:  55 year old African-American man with past history significant for polysubstance abuse with unremarkable medical history (does not have regular medical care).  His last creatinine from 1 year ago was normal at 1.04.  He was brought in by EMS after his friends called to alert the patient and passing out on the floor for about 12 hours after snorting cocaine that may have been laced with fentanyl.  In the emergency room, he was found to have inability to move his left leg and evidence of acute kidney injury with a creatinine of 5.5 and hyperkalemia with a potassium of 6.7 and evidence of rhabdomyolysis with CPK >50,000.  He was seen by orthopedic surgery for concerns of compartment syndrome/rhabdomyolysis of the left lower extremity and pressures did not show evidence of this yesterday.  Concern is raised with his worsening renal function-creatinine is now up to 6.9 with some improvement of potassium to 5.2 and oliguria of 200 cc overnight in spite of 3 L intravenous fluid loading.  He denies any history of acute kidney injury in the past, history of recurrent nephrolithiasis or history of recurrent UTIs.  He denies any fever, chills, cough or sputum production and does not have any chest pain at this time.  His only complaint is that of left lower extremity weakness and discomfort/pain of the left thigh/leg.  History reviewed. No pertinent past medical history.  Past Surgical History:  Procedure Laterality Date   ANKLE SURGERY      History reviewed. No pertinent family history.  Social History:  reports that he has been smoking cigarettes. He has never used smokeless tobacco. He reports current alcohol use of about 10.0 standard drinks per week. He reports current drug use. Drug: Cocaine.  Allergies:  Allergies  Allergen Reactions   Other Hives and Rash    peaches    Medications: I have reviewed the  patient's current medications. Scheduled:  BMP Latest Ref Rng & Units 09/29/2020 09/29/2020 09/29/2020  Glucose 70 - 99 mg/dL 381(W) - 299(B)  BUN 6 - 20 mg/dL 71(I) - 96(V)  Creatinine 0.61 - 1.24 mg/dL 8.93(Y) - 1.01(B)  Sodium 135 - 145 mmol/L 132(L) - 133(L)  Potassium 3.5 - 5.1 mmol/L 5.2(H) 5.4(H) 6.7(HH)  Chloride 98 - 111 mmol/L 93(L) - 99  CO2 22 - 32 mmol/L 23 - 21(L)  Calcium 8.9 - 10.3 mg/dL 6.6(L) - 6.7(L)   CBC Latest Ref Rng & Units 09/29/2020 09/28/2020 07/30/2019  WBC 4.0 - 10.5 K/uL 24.0(H) 27.3(H) 7.8  Hemoglobin 13.0 - 17.0 g/dL 51.0 18.3(H) 12.6(L)  Hematocrit 39.0 - 52.0 % 40.8 57.4(H) 38.2(L)  Platelets 150 - 400 K/uL 213 316 303     CT Abdomen Pelvis Wo Contrast  Result Date: 09/28/2020 CLINICAL DATA:  Left leg pain EXAM: CT ABDOMEN AND PELVIS WITHOUT CONTRAST TECHNIQUE: Multidetector CT imaging of the abdomen and pelvis was performed following the standard protocol without IV contrast. COMPARISON:  None. FINDINGS: LOWER CHEST: Bibasilar atelectasis HEPATOBILIARY: Normal hepatic contours. No intra- or extrahepatic biliary dilatation. The gallbladder is normal. PANCREAS: Normal pancreas. No ductal dilatation or peripancreatic fluid collection. SPLEEN: Normal. ADRENALS/URINARY TRACT: The adrenal glands are normal. No hydronephrosis, nephroureterolithiasis or solid renal mass. The urinary bladder is normal for degree of distention STOMACH/BOWEL: There is no hiatal hernia. Normal duodenal course and caliber. No small bowel dilatation or inflammation. No focal colonic abnormality. Normal appendix. VASCULAR/LYMPHATIC: There is calcific atherosclerosis of the abdominal  aorta. No lymphadenopathy. REPRODUCTIVE: Normal prostate size with symmetric seminal vesicles. MUSCULOSKELETAL. No bony spinal canal stenosis or focal osseous abnormality. OTHER: Large intramuscular hematoma the left thigh is better characterized on concomitant CT of the left lower extremity. IMPRESSION: 1. Large  intramuscular hematoma the left thigh is better characterized on concomitant CT of the left lower extremity. 2. No acute abnormality of the abdomen or pelvis. Aortic Atherosclerosis (ICD10-I70.0). Electronically Signed   By: Deatra Robinson M.D.   On: 09/28/2020 20:41   DG Lumbar Spine Complete  Result Date: 09/28/2020 CLINICAL DATA:  Fall with left leg pain. EXAM: LUMBAR SPINE - COMPLETE 4+ VIEW COMPARISON:  Remote radiograph 04/24/2008 FINDINGS: Again seen transitional lumbosacral anatomy. There is no evidence of fracture. Normal alignment. Vertebral body heights are normal. Minor endplate spurring at multiple levels. Disc space narrowing at the lumbosacral junction. Remaining disc spaces are preserved. Minor lower lumbar facet hypertrophy. Aorto bi-iliac atherosclerosis is age advanced. IMPRESSION: 1. No acute fracture or subluxation of the lumbar spine. 2. Mild spondylosis with endplate spurring and facet hypertrophy. 3. Age advanced aortoiliac atherosclerosis. Electronically Signed   By: Narda Rutherford M.D.   On: 09/28/2020 17:10   CT HEAD WO CONTRAST ( )  Result Date: 09/29/2020 CLINICAL DATA:  Mental status change, unknown cause EXAM: CT HEAD WITHOUT CONTRAST TECHNIQUE: Contiguous axial images were obtained from the base of the skull through the vertex without intravenous contrast. COMPARISON:  04/09/2013 FINDINGS: Brain: No acute intracranial abnormality. Specifically, no hemorrhage, hydrocephalus, mass lesion, acute infarction, or significant intracranial injury. Vascular: No hyperdense vessel or unexpected calcification. Skull: No acute calvarial abnormality. Sinuses/Orbits: Mucosal thickening in the ethmoid air cells. Air-fluid levels in the maxillary sinuses. Other: None IMPRESSION: No acute intracranial abnormality. Acute on chronic sinusitis. Electronically Signed   By: Charlett Nose M.D.   On: 09/29/2020 03:17   MR LUMBAR SPINE WO CONTRAST  Result Date: 09/29/2020 CLINICAL DATA:  Low back  pain, cauda equina syndrome suspected EXAM: MRI LUMBAR SPINE WITHOUT CONTRAST TECHNIQUE: Multiplanar, multisequence MR imaging of the lumbar spine was performed. No intravenous contrast was administered. COMPARISON:  None. FINDINGS: Segmentation:  Standard. Alignment:  Physiologic. Vertebrae:  Mild discogenic endplate edema at L5 Conus medullaris and cauda equina: Conus extends to the L1 level. Conus and cauda equina appear normal. Paraspinal and other soft tissues: Edema within the right greater than left paraspinous muscles. No abnormal signal within the iliopsoas muscles. Disc levels: No spinal canal stenosis or neural impingement.  No disc herniation. IMPRESSION: 1. Edema within the right greater than left paraspinous muscles. No iliopsoas muscle signal abnormality. 2. Normal alignment. No spinal canal or neural foraminal stenosis. Electronically Signed   By: Deatra Robinson M.D.   On: 09/29/2020 03:32   MR PELVIS WO CONTRAST  Result Date: 09/29/2020 CLINICAL DATA:  Left buttock pain. History of cocaine abuse. Elevated CK and potassium. EXAM: MRI PELVIS WITHOUT CONTRAST TECHNIQUE: Multiplanar multisequence MR imaging of the pelvis was performed. No intravenous contrast was administered. COMPARISON:  CT abdomen pelvis from yesterday. FINDINGS: Bones/Joint/Cartilage No suspicious marrow signal abnormality. No fracture or dislocation. Mild bilateral hip osteoarthritis. No joint effusion. Muscles and Tendons Prominent, patchy muscle edema involving the left gluteal muscles. Milder diffuse edema in the visualized lower paraspinous muscles. Edema within the right quadratus femoris muscle. Edema within the left adductor and hamstring muscles as described on separate MRI left thigh report from same day. Soft tissue No fluid collection or hematoma. No soft tissue mass. The visualized internal pelvic  contents are unremarkable. IMPRESSION: 1. Prominent left gluteal and upper thigh muscle edema most consistent with  rhabdomyolysis given clinical history. 2. Isolated edema within the right quadratus femoris muscle may be related to rhabdomyolysis as well, but can also be seen with ischiofemoral impingement 3. Mild bilateral hip osteoarthritis. Electronically Signed   By: Obie Dredge M.D.   On: 09/29/2020 05:17   MR FEMUR LEFT WO CONTRAST  Result Date: 09/29/2020 CLINICAL DATA:  Left thigh swelling. History of cocaine abuse. Elevated CK and potassium. EXAM: MR OF THE LEFT FEMUR WITHOUT CONTRAST TECHNIQUE: Multiplanar, multisequence MR imaging of the left thigh was performed. No intravenous contrast was administered. COMPARISON:  CT left leg from same day. FINDINGS: Bones/Joint/Cartilage No marrow signal abnormality. No fracture or dislocation. No joint effusion. Muscles and Tendons Severe muscle edema diffusely involving the adductor and hamstring muscle compartments, as well as the visualized obturator, gluteus maximus, and gracilis muscles, with associated interfascial fluid. No increased T1 hyperintensity to suggest muscle hemorrhage. Soft tissue Prominent soft tissue swelling in the medial and posterior thigh. No fluid collection or hematoma. No soft tissue mass. IMPRESSION: 1. Severe muscle edema involving the medial and posterior muscle compartments of the left thigh, most consistent with rhabdomyolysis given clinical history. Electronically Signed   By: Obie Dredge M.D.   On: 09/29/2020 05:09   CT EXTREMITY LOWER LEFT WO CONTRAST  Result Date: 09/28/2020 CLINICAL DATA:  Left leg pain and swelling. EXAM: CT OF THE LOWER LEFT EXTREMITY WITHOUT CONTRAST TECHNIQUE: Multidetector CT imaging of the lower left extremity was performed according to the standard protocol. COMPARISON:  None. FINDINGS: There is marked enlargement of the left thigh. the left adductor muscle compartment is markedly enlarged and the muscles appear edematous. There is also surrounding inflammatory changes and fluid. This process continues  down into the posterior compartment of the thigh and terminates just above the knee. There is also some associated subcutaneous soft tissue swelling/edema/fluid. The anterior compartment of the knee is unremarkable. I do not see a discrete intramuscular hematoma. This has more the appearance of edema/myositis. Findings could be due to viral myositis, other post infectious myositis, muscle infarcts or drug related. Could not exclude compartment syndrome given the amount of swelling and edema. However, this is a clinical diagnosis. I do not see any significant findings below the knee. The femur is intact. The tibia and fibula are intact. Joint spaces are maintained. No findings suspicious for septic arthritis or osteomyelitis. Scattered arterial calcifications. IMPRESSION: 1. Marked enlargement of the left adductor and hamstring muscle compartments with surrounding inflammatory changes and intermuscular fluid. This has the appearance of edema/myositis. No definite hematoma. Findings could be due to viral myositis, other post infectious myositis, muscle infarcts or drug related. Could not exclude compartment syndrome given the amount of swelling and edema. However, this is a clinical diagnosis. 2. No significant bony findings. Fracture, septic arthritis or osteomyelitis. Electronically Signed   By: Rudie Meyer M.D.   On: 09/28/2020 20:44   DG Hip Unilat W or Wo Pelvis 2-3 Views Left  Result Date: 09/28/2020 CLINICAL DATA:  Fall with left leg pain. EXAM: DG HIP (WITH OR WITHOUT PELVIS) 2-3V LEFT COMPARISON:  None. FINDINGS: There is mild left hip joint space narrowing and acetabular spurring. The femoral head is well seated. There is no evidence of fracture. The pubic rami are intact. Pubic symphysis and sacroiliac joints are congruent. No evidence of a vascular necrosis or focal bone abnormality. Vascular calcifications are seen. IMPRESSION: Mild  left hip osteoarthritis. No acute fracture. Electronically Signed    By: Narda Rutherford M.D.   On: 09/28/2020 17:13    Review of Systems  Constitutional:  Negative for chills and fever.  HENT:  Negative for sinus pain, sore throat and trouble swallowing.   Eyes:  Negative for photophobia and visual disturbance.  Respiratory:  Negative for cough, chest tightness and shortness of breath.   Cardiovascular:  Negative for chest pain and leg swelling.  Gastrointestinal:  Negative for abdominal pain, blood in stool, diarrhea, nausea and vomiting.  Genitourinary:  Negative for flank pain and hematuria.  Musculoskeletal:  Positive for myalgias.       Left thigh/hip  Neurological:  Positive for weakness.       Left lower extremity  Blood pressure (!) 148/98, pulse 83, temperature 98.6 F (37 C), temperature source Oral, resp. rate 20, weight 71.8 kg, SpO2 92 %. Physical Exam Vitals and nursing note reviewed.  Constitutional:      Appearance: Normal appearance. He is normal weight.     Comments: Appears visibly uncomfortable  HENT:     Head: Normocephalic and atraumatic.     Right Ear: External ear normal.     Left Ear: External ear normal.     Nose: Nose normal.     Mouth/Throat:     Mouth: Mucous membranes are dry.     Pharynx: Oropharynx is clear.  Eyes:     General: No scleral icterus.    Extraocular Movements: Extraocular movements intact.     Conjunctiva/sclera: Conjunctivae normal.  Cardiovascular:     Rate and Rhythm: Normal rate and regular rhythm.     Pulses: Normal pulses.     Heart sounds: Normal heart sounds.  Pulmonary:     Effort: Pulmonary effort is normal.     Breath sounds: Normal breath sounds. No wheezing or rales.  Abdominal:     General: Abdomen is flat.     Palpations: Abdomen is soft.     Tenderness: There is no abdominal tenderness. There is no guarding.  Musculoskeletal:     Cervical back: Normal range of motion and neck supple.     Right lower leg: No edema.     Left lower leg: No edema.     Comments: Left thigh  tenderness  Skin:    General: Skin is warm and dry.  Neurological:     Mental Status: He is alert and oriented to person, place, and time.     Sensory: Sensory deficit present.     Comments: Left lower extremity numbness    Assessment/Plan: 1.  Acute kidney injury: This is secondary to pigment nephropathy/rhabdomyolysis.  Will increase rate of intravenous fluids and attempt to augment urine output with diuretic challenge.  If urine output does not improve overnight and renal function continues to worsen, he will most likely need initiation of renal replacement therapy within the next 24 hours. ,Avoid nephrotoxic medications including NSAIDs and iodinated intravenous contrast exposure unless the latter is absolutely indicated.  Preferred narcotic agents for pain control are hydromorphone, fentanyl, and methadone. Morphine should not be used. Avoid Baclofen and avoid oral sodium phosphate and magnesium citrate based laxatives / bowel preps. Continue strict Input and Output monitoring. Will monitor the patient closely with you and intervene or adjust therapy as indicated by changes in clinical status/labs. 2.  Hyperkalemia: Secondary to rhabdomyolysis and associated acute kidney injury.  Treated successfully with medications overnight and will continue to follow with sequential labs. 3.  Rhabdomyolysis: Secondary to prolonged dependent posturing of the left lower extremity following drug use.  No evidence of compartment syndrome based on yesterday's pressure measurements-if CPK does not improve, this may need to be remeasured for possible fasciotomy. 4.  Polysubstance abuse: Educated about the need for cessation/abstinence.    Brett Butler K. 09/29/2020, 5:28 PM

## 2020-09-29 NOTE — H&P (Signed)
History and Physical    Brett Butler TGY:[id number] DOB: 1965-08-20 DOA: 09/28/2020  PCP: Lavinia Sharps, NP  Patient coming from: Home.  Chief Complaint: Lethargy.  HPI: Brett Butler is a 55 y.o. male with history of polysubstance abuse including cocaine was found to be lethargic after EMS was called by patient's friends.  Patient was given Narcan following which patient became more alert awake.  Patient states he snorted cocaine following which she fell onto the floor and had remained so for almost 12 hours.  Later on EMS was called and given Narcan and was brought to the ER.  ED Course: In the ER patient was noticed to not moving his left lower extremity and labs show markedly elevated CK levels more than 50,000 lactic acid of 5.1 WBC count was 27,000 LFTs were elevated with AST of 200 and ALT of 5200 total bilirubin was normal.  Potassium was 6.5 EKG showing peaked T waves.  Patient's creatinine about last year in June was 1.04.  Patient was given temporizing measures for the hyperkalemia including calcium gluconate D50 insulin and Lokelma.  Patient underwent CT scan of the abdomen and left lower extremity which showed swelling of the left and external hamstring muscles concerning for myositis.  And also there was some concern for compartment syndrome.  Dr. Linna Caprice orthopedic surgeons was consulted and per orthopedic surgeon pressures checked did not show any signs of compartment syndrome.  But requested getting MRI of the L-spine since patient was complaining of low back pain and on my exam patient does have tenderness in the low back.  Patient unable to move his left lower extremity with no sensation in the distal half of the left lower extremity.  Good pulses.  COVID test was negative.  Review of Systems: As per HPI, rest all negative.   History reviewed. No pertinent past medical history.  Past Surgical History:  Procedure Laterality Date   ANKLE SURGERY       reports that he  has been smoking cigarettes. He has never used smokeless tobacco. He reports current alcohol use of about 10.0 standard drinks per week. He reports current drug use. Drug: Cocaine.  Allergies  Allergen Reactions   Other Hives and Rash    peaches    History reviewed. No pertinent family history.  Prior to Admission medications   Medication Sig Start Date End Date Taking? Authorizing Provider  DULoxetine (CYMBALTA) 30 MG capsule Take 1 capsule (30 mg total) by mouth 2 (two) times daily. Patient not taking: Reported on 06/15/2020 09/10/19 09/09/20  Zena Amos, MD  loratadine (CLARITIN) 10 MG tablet Take 10 mg by mouth daily as needed for allergies.    [provider]  lurasidone (LATUDA) 40 MG TABS tablet Take 1 tablet (40 mg total) by mouth daily with supper. 09/10/19   Zena Amos, MD    Physical Exam: Constitutional: Moderately built and nourished. Vitals:   09/28/20 2000 09/28/20 2030 09/28/20 2100 09/28/20 2130  BP: (!) 121/92 110/86 (!) 160/103 (!) 163/116  Pulse: (!) 105 (!) 104 94 (!) 106  Resp: 17 19 (!) 24 20  Temp:      TempSrc:      SpO2: 95% 95% 97% 96%   Eyes: Anicteric no pallor. ENMT: No discharge from the ears eyes nose and mouth. Neck: No mass felt.  No neck rigidity. Respiratory: No rhonchi or crepitations. Cardiovascular: S1-S2 heard. Abdomen: Soft nontender bowel sound present. Musculoskeletal: Left lower extremity mild swelling unable to move but  good pulses. Skin: Small skin tear on the forehead. Neurologic: Alert awake oriented to time place and person.  Unable to move his left lower extremity and he has no sensation below his left eye. Psychiatric: Patient is oriented to his name and place.   Labs on Admission: I have personally reviewed following labs and imaging studies  CBC: Recent Labs  Lab 09/28/20 1617  WBC 27.3*  NEUTROABS 23.6*  HGB 18.3*  HCT 57.4*  MCV 99.1  PLT 316   Basic Metabolic Panel: Recent Labs  Lab  09/28/20 1800  NA 137  K 6.5*  CL 96*  CO2 19*  GLUCOSE 128*  BUN 34*  CREATININE 4.95*  CALCIUM 8.3*   GFR: CrCl cannot be calculated (Unknown ideal weight.). Liver Function Tests: Recent Labs  Lab 09/28/20 1800  AST 1,244*  ALT 522*  ALKPHOS 72  BILITOT 0.4  PROT 8.9*  ALBUMIN 4.1   No results for input(s): LIPASE, AMYLASE in the last 168 hours. No results for input(s): AMMONIA in the last 168 hours. Coagulation Profile: No results for input(s): INR, PROTIME in the last 168 hours. Cardiac Enzymes: Recent Labs  Lab 09/28/20 1800  CKTOTAL >50,000*   BNP (last 3 results) No results for input(s): PROBNP in the last 8760 hours. HbA1C: No results for input(s): HGBA1C in the last 72 hours. CBG: No results for input(s): GLUCAP in the last 168 hours. Lipid Profile: No results for input(s): CHOL, HDL, LDLCALC, TRIG, CHOLHDL, LDLDIRECT in the last 72 hours. Thyroid Function Tests: No results for input(s): TSH, T4TOTAL, FREET4, T3FREE, THYROIDAB in the last 72 hours. Anemia Panel: No results for input(s): VITAMINB12, FOLATE, FERRITIN, TIBC, IRON, RETICCTPCT in the last 72 hours. Urine analysis:    Component Value Date/Time   COLORURINE AMBER (A) 04/09/2013 2131   APPEARANCEUR CLOUDY (A) 04/09/2013 2131   LABSPEC 1.030 04/09/2013 2131   PHURINE 5.0 04/09/2013 2131   GLUCOSEU NEGATIVE 04/09/2013 2131   HGBUR TRACE (A) 04/09/2013 2131   BILIRUBINUR NEGATIVE 04/09/2013 2131   KETONESUR NEGATIVE 04/09/2013 2131   PROTEINUR 30 (A) 04/09/2013 2131   UROBILINOGEN 0.2 04/09/2013 2131   NITRITE NEGATIVE 04/09/2013 2131   LEUKOCYTESUR NEGATIVE 04/09/2013 2131   Sepsis Labs: (procalcitonin:4,lacticidven:4) ) Recent Results (from the past 240 hour(s))  Resp Panel by RT-PCR (Flu A&B, Covid) Nasopharyngeal Swab     Status: None   Collection Time: 09/28/20  8:00 PM   Specimen: Nasopharyngeal Swab; Nasopharyngeal(NP) swabs in vial transport medium  Result Value Ref  Range Status   SARS Coronavirus 2 by RT PCR NEGATIVE NEGATIVE Final    Comment: (NOTE) SARS-CoV-2 target nucleic acids are NOT DETECTED.  The SARS-CoV-2 RNA is generally detectable in upper respiratory specimens during the acute phase of infection. The lowest concentration of SARS-CoV-2 viral copies this assay can detect is 138 copies/mL. A negative result does not preclude SARS-Cov-2 infection and should not be used as the sole basis for treatment or other patient management decisions. A negative result may occur with  improper specimen collection/handling, submission of specimen other than nasopharyngeal swab, presence of viral mutation(s) within the areas targeted by this assay, and inadequate number of viral copies(<138 copies/mL). A negative result must be combined with clinical observations, patient history, and epidemiological information. The expected result is Negative.  Fact Sheet for Patients:  BloggerCourse.com  Fact Sheet for Healthcare Providers:  SeriousBroker.it  This test is no t yet approved or cleared by the [place] and  has been authorized for  detection and/or diagnosis of SARS-CoV-2 by FDA under an Emergency Use Authorization (EUA). This EUA will remain  in effect (meaning this test can be used) for the duration of the COVID-19 declaration under Section 564(b)(1) of the Act, 21 U.S.C.section 360bbb-3(b)(1), unless the authorization is terminated  or revoked sooner.       Influenza A by PCR NEGATIVE NEGATIVE Final   Influenza B by PCR NEGATIVE NEGATIVE Final    Comment: (NOTE) The Xpert Xpress SARS-CoV-2/FLU/RSV plus assay is intended as an aid in the diagnosis of influenza from Nasopharyngeal swab specimens and should not be used as a sole basis for treatment. Nasal washings and aspirates are unacceptable for Xpert Xpress SARS-CoV-2/FLU/RSV testing.  Fact Sheet for  Patients: BloggerCourse.comhttps://www.fda.gov/media/152166/download  Fact Sheet for Healthcare Providers: SeriousBroker.ithttps://www.fda.gov/media/152162/download  This test is not yet approved or cleared by the [place] FDA and has been authorized for detection and/or diagnosis of SARS-CoV-2 by FDA under an Emergency Use Authorization (EUA). This EUA will remain in effect (meaning this test can be used) for the duration of the COVID-19 declaration under Section 564(b)(1) of the Act, 21 U.S.C. section 360bbb-3(b)(1), unless the authorization is terminated or revoked.  Performed at [facility], [address] [place], [place] [address]      Radiological Exams on Admission: CT Abdomen Pelvis Wo Contrast  Result Date: 09/28/2020 CLINICAL DATA:  Left leg pain EXAM: CT ABDOMEN AND PELVIS WITHOUT CONTRAST TECHNIQUE: Multidetector CT imaging of the abdomen and pelvis was performed following the standard protocol without IV contrast. COMPARISON:  None. FINDINGS: LOWER CHEST: Bibasilar atelectasis HEPATOBILIARY: Normal hepatic contours. No intra- or extrahepatic biliary dilatation. The gallbladder is normal. PANCREAS: Normal pancreas. No ductal dilatation or peripancreatic fluid collection. SPLEEN: Normal. ADRENALS/URINARY TRACT: The adrenal glands are normal. No hydronephrosis, nephroureterolithiasis or solid renal mass. The urinary bladder is normal for degree of distention STOMACH/BOWEL: There is no hiatal hernia. Normal duodenal course and caliber. No small bowel dilatation or inflammation. No focal colonic abnormality. Normal appendix. VASCULAR/LYMPHATIC: There is calcific atherosclerosis of the abdominal aorta. No lymphadenopathy. REPRODUCTIVE: Normal prostate size with symmetric seminal vesicles. MUSCULOSKELETAL. No bony spinal canal stenosis or focal osseous abnormality. OTHER: Large intramuscular hematoma the left thigh is better characterized on concomitant CT of the left lower extremity.  IMPRESSION: 1. Large intramuscular hematoma the left thigh is better characterized on concomitant CT of the left lower extremity. 2. No acute abnormality of the abdomen or pelvis. Aortic Atherosclerosis (ICD10-I70.0). Electronically Signed   By: Deatra RobinsonKevin  Herman M.D.   On: 09/28/2020 20:41   DG Lumbar Spine Complete  Result Date: 09/28/2020 CLINICAL DATA:  Fall with left leg pain. EXAM: LUMBAR SPINE - COMPLETE 4+ VIEW COMPARISON:  Remote radiograph 04/24/2008 FINDINGS: Again seen transitional lumbosacral anatomy. There is no evidence of fracture. Normal alignment. Vertebral body heights are normal. Minor endplate spurring at multiple levels. Disc space narrowing at the lumbosacral junction. Remaining disc spaces are preserved. Minor lower lumbar facet hypertrophy. Aorto bi-iliac atherosclerosis is age advanced. IMPRESSION: 1. No acute fracture or subluxation of the lumbar spine. 2. Mild spondylosis with endplate spurring and facet hypertrophy. 3. Age advanced aortoiliac atherosclerosis. Electronically Signed   By: Narda RutherfordMelanie  Sanford M.D.   On: 09/28/2020 17:10   CT EXTREMITY LOWER LEFT WO CONTRAST  Result Date: 09/28/2020 CLINICAL DATA:  Left leg pain and swelling. EXAM: CT OF THE LOWER LEFT EXTREMITY WITHOUT CONTRAST TECHNIQUE: Multidetector CT imaging of the lower left extremity was performed according to the standard protocol.  COMPARISON:  None. FINDINGS: There is marked enlargement of the left thigh. the left adductor muscle compartment is markedly enlarged and the muscles appear edematous. There is also surrounding inflammatory changes and fluid. This process continues down into the posterior compartment of the thigh and terminates just above the knee. There is also some associated subcutaneous soft tissue swelling/edema/fluid. The anterior compartment of the knee is unremarkable. I do not see a discrete intramuscular hematoma. This has more the appearance of edema/myositis. Findings could be due to viral  myositis, other post infectious myositis, muscle infarcts or drug related. Could not exclude compartment syndrome given the amount of swelling and edema. However, this is a clinical diagnosis. I do not see any significant findings below the knee. The femur is intact. The tibia and fibula are intact. Joint spaces are maintained. No findings suspicious for septic arthritis or osteomyelitis. Scattered arterial calcifications. IMPRESSION: 1. Marked enlargement of the left adductor and hamstring muscle compartments with surrounding inflammatory changes and intermuscular fluid. This has the appearance of edema/myositis. No definite hematoma. Findings could be due to viral myositis, other post infectious myositis, muscle infarcts or drug related. Could not exclude compartment syndrome given the amount of swelling and edema. However, this is a clinical diagnosis. 2. No significant bony findings. Fracture, septic arthritis or osteomyelitis. Electronically Signed   By: Rudie Meyer M.D.   On: 09/28/2020 20:44   DG Hip Unilat W or Wo Pelvis 2-3 Views Left  Result Date: 09/28/2020 CLINICAL DATA:  Fall with left leg pain. EXAM: DG HIP (WITH OR WITHOUT PELVIS) 2-3V LEFT COMPARISON:  None. FINDINGS: There is mild left hip joint space narrowing and acetabular spurring. The femoral head is well seated. There is no evidence of fracture. The pubic rami are intact. Pubic symphysis and sacroiliac joints are congruent. No evidence of a vascular necrosis or focal bone abnormality. Vascular calcifications are seen. IMPRESSION: Mild left hip osteoarthritis. No acute fracture. Electronically Signed   By: Narda Rutherford M.D.   On: 09/28/2020 17:13    EKG: Independently reviewed.  Sinus rhythm with peaked T waves and QTC of 520 ms.  Assessment/Plan Principal Problem:   ARF (acute renal failure) (HCC) Active Problems:   Rhabdomyolysis   Cocaine abuse (HCC)    Acute renal failure with hyperkalemia and metabolic acidosis likely  from rhabdomyolysis for which patient was given 3 L fluid bolus and I have placed patient on bicarbonate drip.  Closely follow intake output metabolic panel.  Follow CK levels.  UA is pending. Left lower extremity myositis with rhabdomyolysis -orthopedic surgeon Dr. Linna Caprice was consulted at this time and Dr. Linna Caprice does not feel that patient has compartment syndrome but requested getting MRI of the lumbar spine to rule out any cord compression or spinal fracture.  I have discussed with Dr. Terrilee Files neurologist and patient will be getting MRI stat.  Nursing CT head is also pending.  Further recommendations based on these findings. Rhabdomyolysis likely related to the fall.  Presently receiving IV fluids and bicarbonate.  Follow CK levels. Cocaine abuse will need counseling. Leukocytosis could be reactionary.  Was initially given antibiotics.  We will follow cultures.  No signs of sepsis. Elevated LFTs likely due to rhabdomyolysis.  Follow LFTs.  Check Tylenol levels.  Since patient has acute renal failure with rhabdomyolysis and left lower extremity weakness will need close monitoring and further work-up and inpatient status.   DVT prophylaxis: No pharmacological DVT prophylaxis for now until make sure there is no bleeding inside  the muscles. Code Status: Full code. Family Communication: Need to discuss with family when available. Disposition Plan: To be determined. Consults called: Orthopedics and neurology. Admission status: Inpatient.   Eduard Clos MD [facility] Pager [phone].  If 7PM-7AM, please contact night-coverage [email or web address] Password [email or web address]  09/29/2020, 12:20 AM

## 2020-09-29 NOTE — ED Notes (Signed)
Attempted to call report for [id number], RN has not read the chart yet and said they will call back.

## 2020-09-29 NOTE — Progress Notes (Signed)
MRI L spine reviewed, which does not explain his neurological deficits. I think he has Saturday night palsy from his positioning. However, I will also have ortho trauma take a look at him as this case is very atypical.

## 2020-09-29 NOTE — ED Notes (Signed)
[facility] called for transport. 

## 2020-09-29 NOTE — Consult Note (Signed)
ORTHOPAEDIC CONSULTATION  REQUESTING PHYSICIAN: Narda BondsNettey, Ralph A, MD  Chief Complaint: Numbness pain and swelling left lower extremity.  HPI: Brett Butler who presents with rhabdomyolysis of the left lower extremity secondary to being found down for a prolonged period of time.  Patient states he cannot move his foot or toes on the left side has numbness on the bottom of the left foot.  Complains of pain worse in the left gluteus muscles.  History reviewed. No pertinent past medical history. Past Surgical History:  Procedure Laterality Date   ANKLE SURGERY     Social History   Socioeconomic History   Marital status: Legally Separated    Spouse name: Not on file   Number of children: Not on file   Years of education: Not on file   Highest education level: Not on file  Occupational History   Not on file  Tobacco Use   Smoking status: Every Day    Types: Cigarettes   Smokeless tobacco: Never  Substance and Sexual Activity   Alcohol use: Yes    Alcohol/week: 10.0 standard drinks    Types: 10 Cans of beer per week   Drug use: Yes    Types: Cocaine   Sexual activity: Not on file  Other Topics Concern   Not on file  Social History Narrative   Not on file   Social Determinants of Health   Financial Resource Strain: Not on file  Food Insecurity: Not on file  Transportation Needs: Not on file  Physical Activity: Not on file  Stress: Not on file  Social Connections: Not on file   History reviewed. No pertinent family history. - negative except otherwise stated in the family history section Allergies  Allergen Reactions   Other Hives and Rash    peaches   Prior to Admission medications   Medication Sig Start Date End Date Taking? Authorizing Provider  cyclobenzaprine (FLEXERIL) 10 MG tablet Take 10 mg by mouth 2 (two) times daily as needed for muscle spasms.   Yes [provider]  DULoxetine (CYMBALTA) 30 MG capsule Take 1 capsule (30 mg  total) by mouth 2 (two) times daily. Patient not taking: Reported on 06/15/2020 09/10/19 09/09/20  Zena AmosKaur, Mandeep, MD  loratadine (CLARITIN) 10 MG tablet Take 10 mg by mouth daily as needed for allergies. Patient not taking: Reported on 09/29/2020    [provider]  lurasidone (LATUDA) 40 MG TABS tablet Take 1 tablet (40 mg total) by mouth daily with supper. Patient not taking: Reported on 09/29/2020 09/10/19   Zena AmosKaur, Mandeep, MD   CT Abdomen Pelvis Wo Contrast  Result Date: 09/28/2020 CLINICAL DATA:  Left leg pain EXAM: CT ABDOMEN AND PELVIS WITHOUT CONTRAST TECHNIQUE: Multidetector CT imaging of the abdomen and pelvis was performed following the standard protocol without IV contrast. COMPARISON:  None. FINDINGS: LOWER CHEST: Bibasilar atelectasis HEPATOBILIARY: Normal hepatic contours. No intra- or extrahepatic biliary dilatation. The gallbladder is normal. PANCREAS: Normal pancreas. No ductal dilatation or peripancreatic fluid collection. SPLEEN: Normal. ADRENALS/URINARY TRACT: The adrenal glands are normal. No hydronephrosis, nephroureterolithiasis or solid renal mass. The urinary bladder is normal for degree of distention STOMACH/BOWEL: There is no hiatal hernia. Normal duodenal course and caliber. No small bowel dilatation or inflammation. No focal colonic abnormality. Normal appendix. VASCULAR/LYMPHATIC: There is calcific atherosclerosis of the abdominal aorta. No lymphadenopathy. REPRODUCTIVE: Normal prostate size with symmetric seminal vesicles. MUSCULOSKELETAL. No bony spinal canal stenosis or focal osseous abnormality. OTHER: Large intramuscular hematoma  the left thigh is better characterized on concomitant CT of the left lower extremity. IMPRESSION: 1. Large intramuscular hematoma the left thigh is better characterized on concomitant CT of the left lower extremity. 2. No acute abnormality of the abdomen or pelvis. Aortic Atherosclerosis (ICD10-I70.0). Electronically Signed   By: Deatra Robinson  M.D.   On: 09/28/2020 20:41   DG Lumbar Spine Complete  Result Date: 09/28/2020 CLINICAL DATA:  Fall with left leg pain. EXAM: LUMBAR SPINE - COMPLETE 4+ VIEW COMPARISON:  Remote radiograph 04/24/2008 FINDINGS: Again seen transitional lumbosacral anatomy. There is no evidence of fracture. Normal alignment. Vertebral body heights are normal. Minor endplate spurring at multiple levels. Disc space narrowing at the lumbosacral junction. Remaining disc spaces are preserved. Minor lower lumbar facet hypertrophy. Aorto bi-iliac atherosclerosis is age advanced. IMPRESSION: 1. No acute fracture or subluxation of the lumbar spine. 2. Mild spondylosis with endplate spurring and facet hypertrophy. 3. Age advanced aortoiliac atherosclerosis. Electronically Signed   By: Narda Rutherford M.D.   On: 09/28/2020 17:10   CT HEAD WO CONTRAST ( )  Result Date: 09/29/2020 CLINICAL DATA:  Mental status change, unknown cause EXAM: CT HEAD WITHOUT CONTRAST TECHNIQUE: Contiguous axial images were obtained from the base of the skull through the vertex without intravenous contrast. COMPARISON:  04/09/2013 FINDINGS: Brain: No acute intracranial abnormality. Specifically, no hemorrhage, hydrocephalus, mass lesion, acute infarction, or significant intracranial injury. Vascular: No hyperdense vessel or unexpected calcification. Skull: No acute calvarial abnormality. Sinuses/Orbits: Mucosal thickening in the ethmoid air cells. Air-fluid levels in the maxillary sinuses. Other: None IMPRESSION: No acute intracranial abnormality. Acute on chronic sinusitis. Electronically Signed   By: Charlett Nose M.D.   On: 09/29/2020 03:17   MR LUMBAR SPINE WO CONTRAST  Result Date: 09/29/2020 CLINICAL DATA:  Low back pain, cauda equina syndrome suspected EXAM: MRI LUMBAR SPINE WITHOUT CONTRAST TECHNIQUE: Multiplanar, multisequence MR imaging of the lumbar spine was performed. No intravenous contrast was administered. COMPARISON:  None. FINDINGS:  Segmentation:  Standard. Alignment:  Physiologic. Vertebrae:  Mild discogenic endplate edema at L5 Conus medullaris and cauda equina: Conus extends to the L1 level. Conus and cauda equina appear normal. Paraspinal and other soft tissues: Edema within the right greater than left paraspinous muscles. No abnormal signal within the iliopsoas muscles. Disc levels: No spinal canal stenosis or neural impingement.  No disc herniation. IMPRESSION: 1. Edema within the right greater than left paraspinous muscles. No iliopsoas muscle signal abnormality. 2. Normal alignment. No spinal canal or neural foraminal stenosis. Electronically Signed   By: Deatra Robinson M.D.   On: 09/29/2020 03:32   MR PELVIS WO CONTRAST  Result Date: 09/29/2020 CLINICAL DATA:  Left buttock pain. History of cocaine abuse. Elevated CK and potassium. EXAM: MRI PELVIS WITHOUT CONTRAST TECHNIQUE: Multiplanar multisequence MR imaging of the pelvis was performed. No intravenous contrast was administered. COMPARISON:  CT abdomen pelvis from yesterday. FINDINGS: Bones/Joint/Cartilage No suspicious marrow signal abnormality. No fracture or dislocation. Mild bilateral hip osteoarthritis. No joint effusion. Muscles and Tendons Prominent, patchy muscle edema involving the left gluteal muscles. Milder diffuse edema in the visualized lower paraspinous muscles. Edema within the right quadratus femoris muscle. Edema within the left adductor and hamstring muscles as described on separate MRI left thigh report from same day. Soft tissue No fluid collection or hematoma. No soft tissue mass. The visualized internal pelvic contents are unremarkable. IMPRESSION: 1. Prominent left gluteal and upper thigh muscle edema most consistent with rhabdomyolysis given clinical history. 2. Isolated edema within the  right quadratus femoris muscle may be related to rhabdomyolysis as well, but can also be seen with ischiofemoral impingement 3. Mild bilateral hip osteoarthritis.  Electronically Signed   By: Obie Dredge M.D.   On: 09/29/2020 05:17   MR FEMUR LEFT WO CONTRAST  Result Date: 09/29/2020 CLINICAL DATA:  Left thigh swelling. History of cocaine abuse. Elevated CK and potassium. EXAM: MR OF THE LEFT FEMUR WITHOUT CONTRAST TECHNIQUE: Multiplanar, multisequence MR imaging of the left thigh was performed. No intravenous contrast was administered. COMPARISON:  CT left leg from same day. FINDINGS: Bones/Joint/Cartilage No marrow signal abnormality. No fracture or dislocation. No joint effusion. Muscles and Tendons Severe muscle edema diffusely involving the adductor and hamstring muscle compartments, as well as the visualized obturator, gluteus maximus, and gracilis muscles, with associated interfascial fluid. No increased T1 hyperintensity to suggest muscle hemorrhage. Soft tissue Prominent soft tissue swelling in the medial and posterior thigh. No fluid collection or hematoma. No soft tissue mass. IMPRESSION: 1. Severe muscle edema involving the medial and posterior muscle compartments of the left thigh, most consistent with rhabdomyolysis given clinical history. Electronically Signed   By: Obie Dredge M.D.   On: 09/29/2020 05:09   CT EXTREMITY LOWER LEFT WO CONTRAST  Result Date: 09/28/2020 CLINICAL DATA:  Left leg pain and swelling. EXAM: CT OF THE LOWER LEFT EXTREMITY WITHOUT CONTRAST TECHNIQUE: Multidetector CT imaging of the lower left extremity was performed according to the standard protocol. COMPARISON:  None. FINDINGS: There is marked enlargement of the left thigh. the left adductor muscle compartment is markedly enlarged and the muscles appear edematous. There is also surrounding inflammatory changes and fluid. This process continues down into the posterior compartment of the thigh and terminates just above the knee. There is also some associated subcutaneous soft tissue swelling/edema/fluid. The anterior compartment of the knee is unremarkable. I do not see a  discrete intramuscular hematoma. This has more the appearance of edema/myositis. Findings could be due to viral myositis, other post infectious myositis, muscle infarcts or drug related. Could not exclude compartment syndrome given the amount of swelling and edema. However, this is a clinical diagnosis. I do not see any significant findings below the knee. The femur is intact. The tibia and fibula are intact. Joint spaces are maintained. No findings suspicious for septic arthritis or osteomyelitis. Scattered arterial calcifications. IMPRESSION: 1. Marked enlargement of the left adductor and hamstring muscle compartments with surrounding inflammatory changes and intermuscular fluid. This has the appearance of edema/myositis. No definite hematoma. Findings could be due to viral myositis, other post infectious myositis, muscle infarcts or drug related. Could not exclude compartment syndrome given the amount of swelling and edema. However, this is a clinical diagnosis. 2. No significant bony findings. Fracture, septic arthritis or osteomyelitis. Electronically Signed   By: Rudie Meyer M.D.   On: 09/28/2020 20:44   DG Hip Unilat W or Wo Pelvis 2-3 Views Left  Result Date: 09/28/2020 CLINICAL DATA:  Fall with left leg pain. EXAM: DG HIP (WITH OR WITHOUT PELVIS) 2-3V LEFT COMPARISON:  None. FINDINGS: There is mild left hip joint space narrowing and acetabular spurring. The femoral head is well seated. There is no evidence of fracture. The pubic rami are intact. Pubic symphysis and sacroiliac joints are congruent. No evidence of a vascular necrosis or focal bone abnormality. Vascular calcifications are seen. IMPRESSION: Mild left hip osteoarthritis. No acute fracture. Electronically Signed   By: Narda Rutherford M.D.   On: 09/28/2020 17:13   - pertinent xrays,  CT, MRI studies were reviewed and independently interpreted  Positive ROS: All other systems have been reviewed and were otherwise negative with the  exception of those mentioned in the HPI and as above.  Physical Exam: General: Alert, no acute distress Psychiatric: Patient is competent for consent with normal mood and affect Lymphatic: No axillary or cervical lymphadenopathy Cardiovascular: No pedal edema Respiratory: No cyanosis, no use of accessory musculature GI: No organomegaly, abdomen is soft and non-tender    Images:  @ENCIMAGES @  Labs:  Lab Results  Component Value Date   ESRSEDRATE 16 09/28/2020   CRP 13.9 (H) 09/28/2020   REPTSTATUS PENDING 09/28/2020   CULT  09/28/2020    NO GROWTH < 12 HOURS Performed at [facility], [address] [place], [place]     Lab Results  Component Value Date   ALBUMIN 2.5 (L) 09/29/2020   ALBUMIN 4.1 09/28/2020   ALBUMIN 3.8 07/30/2019     CBC EXTENDED Latest Ref Rng & Units 09/29/2020 09/28/2020 07/30/2019  WBC 4.0 - 10.5 K/uL 24.0(H) 27.3(H) 7.8  RBC 4.22 - 5.81 MIL/uL 4.40 5.79 3.96(L)  HGB 13.0 - 17.0 g/dL 08/01/2019 18.3(H) 12.6(L)  HCT 39.0 - 52.0 % 40.8 57.4(H) 38.2(L)  PLT 150 - 400 K/uL 213 316 303  NEUTROABS 1.7 - 7.7 K/uL 20.9(H) 23.6(H) 4.1  LYMPHSABS 0.7 - 4.0 K/uL 1.4 1.7 2.5    Neurologic: Patient does not have protective sensation bilateral lower extremities.   MUSCULOSKELETAL:   Skin: Examination patient does have increased warmth of the left lower extremity.  He does not have active motor function of the ankle or toes on the left has gross sensation on the plantar and dorsal aspect of his foot but states that this does not feel normal.  Patient has active knee extension with passive flexion and this reproduces pain in the buttocks region.  The calf and thigh are both soft to palpation no evidence of a compartment syndrome clinically.  The gluteus muscles are also soft to palpation but extremely tender to palpation.  Patient has a strong dorsalis pedis pulse no evidence of arterial insufficiency.  His white cell count is 24,000 albumin 2.5.   Potassium has decreased from 6.7-5.4.  BUN 45 creatinine 5.53.  AST is 1038.  Troponin has decreased from 1 40-1 20.  Total CK is greater than 50,000.  Assessment: #1.  Rhabdomyolysis with myositis involving the left thigh and gluteus muscles on the left without evidence of a compartment syndrome.  #2 acute sciatic nerve injury.  Plan: I do not see an indication for surgical intervention at this time, continue aggressive diuresis.  I will follow during his hospital stay.  Thank you for the consult and the opportunity to see Mr. Aurelius Gildersleeve, MD [facility] [phone] 5:01 PM

## 2020-09-29 NOTE — ED Notes (Signed)
Call placed for ED to ED transfer  [facility] called for patient pickup

## 2020-09-29 NOTE — Progress Notes (Addendum)
PROGRESS NOTE    Brett Butler  QQP:[id number] DOB: [birth year] DOA: 09/28/2020 PCP: Lavinia Sharps, NP   Brief Narrative: Brett Butler is a 55 y.o. male with a history of polysubstance use including opiates, cocaine, cannabis in addition to schizoaffective disorder.  Patient presented secondary to being found to be lethargic by his friend.  Patient responded to Narcan when EMS arrived.  On arrival to the hospital, patient was noted to have a severe AKI and evidence of severe rhabdomyolysis.   Assessment & Plan:   Principal Problem:   ARF (acute renal failure) (HCC) Active Problems:   Rhabdomyolysis   Cocaine abuse (HCC)   Hyperkalemia   Leukocytosis   AKI Likely pigment-induced from rhabdomyolysis.  Baseline creatinine of about 1.  Creatinine of 4.95 on admission with increase.  Patient started on IV fluids. -Continue IV fluids, lactated Ringer's at 150 mL/h -Urinalysis -Strict ins/out, watch urine output very carefully -BMP this evening -Creatinine continues to worsen, will consult nephrology  Rhabdomyolysis Secondary to to prolonged time down and resultant trauma.  Advanced imaging with evidence consistent with rhabdomyolysis.  With significant amount of edema noted on advanced imaging, orthopedic surgery was consulted. -Orthopedic surgery recommendations: Plan for consultation with trauma -Lactated Ringer's as mentioned above -Daily CK, CMP  Hyperkalemia Secondary to AKI.  Peak potassium 6.7.  Associated peaked T waves.  Treated with Lokelma. -Continue telemetry until potassium is improved and stable  Demand ischemia Elevated troponin in setting of rhabdomyolysis.  Peak troponin of 140 with delta troponin of 120.  No chest pain.  Cocaine abuse Patient states that he snorts cocaine.  He thinks that the cocaine may have been laced with fentanyl. -Social work consult  Leukocytosis Likely reactive secondary to rhabdomyolysis.  Patient given Vancomycin, Flagyl  and cefepime in the ED which have been discontinued. Blood cultures obtained on admission and are no growth to date.  WBC trended down slightly. -CBC in a.m.   DVT prophylaxis: SCDs Code Status:   Code Status: Full Code Family Communication: None at bedside Disposition Plan: Discharge home vs SNF in several days pending improvement of rhabdomyolysis, AKI, orthopedic surgery recommendations   Consultants:  Orthopedic surgery  Procedures:  None  Antimicrobials: Vancomycin IV Cefepime IV Flagyl IV    Subjective: Left sided buttock/hip pain which is better with taking pressure off. Decreased urine output.  Objective: Vitals:   09/29/20 0440 09/29/20 0500 09/29/20 0802 09/29/20 1200  BP: 138/80  (!) 148/98   Pulse: 84  83   Resp: 16  20   Temp: 99.6 F (37.6 C)  98.8 F (37.1 C) 98.6 F (37 C)  TempSrc: Oral  Oral Oral  SpO2: 90%  92%   Weight:  71.8 kg      Intake/Output Summary (Last 24 hours) at 09/29/2020 1536 Last data filed at 09/29/2020 1358 Gross per 24 hour  Intake 3339.59 ml  Output 200 ml  Net 3139.59 ml   Filed Weights   09/29/20 0500  Weight: 71.8 kg    Examination:  General exam: Appears calm and comfortable Respiratory system: Clear to auscultation. Respiratory effort normal. Cardiovascular system: S1 & S2 heard, RRR. No murmurs, rubs, gallops or clicks. Gastrointestinal system: Abdomen is nondistended, soft and nontender. No organomegaly or masses felt. Normal bowel sounds heard. Central nervous system: Slightly somnolent but easily arouses and is oriented. Musculoskeletal: No calf tenderness. Left buttock with significant edema noted and some tenderness Skin: No cyanosis. No rashes Psychiatry: Judgement and insight appear normal. Mood &  affect appropriate.     Data Reviewed: I have personally reviewed following labs and imaging studies  CBC Lab Results  Component Value Date   WBC 24.0 (H) 09/29/2020   RBC 4.40 09/29/2020   HGB 14.1  09/29/2020   HCT 40.8 09/29/2020   MCV 92.7 09/29/2020   MCH 32.0 09/29/2020   PLT 213 09/29/2020   MCHC 34.6 09/29/2020   RDW 13.9 09/29/2020   LYMPHSABS 1.4 09/29/2020   MONOABS 1.5 (H) 09/29/2020   EOSABS 0.0 09/29/2020   BASOSABS 0.0 09/29/2020     Last metabolic panel Lab Results  Component Value Date   NA 133 (L) 09/29/2020   K 5.4 (H) 09/29/2020   CL 99 09/29/2020   CO2 21 (L) 09/29/2020   BUN 45 (H) 09/29/2020   CREATININE 5.53 (H) 09/29/2020   GLUCOSE 134 (H) 09/29/2020   GFRNONAA 11 (L) 09/29/2020   GFRAA >60 07/30/2019   CALCIUM 6.7 (L) 09/29/2020   PROT 5.6 (L) 09/29/2020   ALBUMIN 2.5 (L) 09/29/2020   BILITOT 0.8 09/29/2020   ALKPHOS 49 09/29/2020   AST 1,038 (H) 09/29/2020   ALT 412 (H) 09/29/2020   ANIONGAP 13 09/29/2020    CBG (last 3)  No results for input(s): GLUCAP in the last 72 hours.   GFR: Estimated Creatinine Clearance: 14.4 mL/min (A) (by C-G formula based on SCr of 5.53 mg/dL (H)).  Coagulation Profile: No results for input(s): INR, PROTIME in the last 168 hours.  Recent Results (from the past 240 hour(s))  Culture, blood (Routine X 2) w Reflex to ID Panel     Status: None (Preliminary result)   Collection Time: 09/28/20  7:27 PM   Specimen: BLOOD  Result Value Ref Range Status   Specimen Description   Final    BLOOD RIGHT ANTECUBITAL Performed at [facility], [address] [place], [place] [address]    Special Requests   Final    Blood Culture results may not be optimal due to an inadequate volume of blood received in culture bottles BOTTLES DRAWN AEROBIC AND ANAEROBIC Performed at [facility], [address] [place], [place] [address]    Culture   Final    NO GROWTH < 12 HOURS Performed at [facility], [address] [place], [place] [address]    Report Status PENDING  Incomplete  Culture, blood (Routine X 2) w Reflex to ID Panel     Status: None (Preliminary result)    Collection Time: 09/28/20  7:32 PM   Specimen: BLOOD  Result Value Ref Range Status   Specimen Description   Final    BLOOD BLOOD RIGHT ARM Performed at [facility], [address] [place], [place] [address]    Special Requests   Final    Blood Culture results may not be optimal due to an inadequate volume of blood received in culture bottles BOTTLES DRAWN AEROBIC AND ANAEROBIC Performed at [facility], [address] [place], [place] [address]    Culture   Final    NO GROWTH < 12 HOURS Performed at [facility], [address] [place], [place] [address]    Report Status PENDING  Incomplete  Resp Panel by RT-PCR (Flu A&B, Covid) Nasopharyngeal Swab     Status: None   Collection Time: 09/28/20  8:00 PM   Specimen: Nasopharyngeal Swab; Nasopharyngeal(NP) swabs in vial transport medium  Result Value Ref Range Status   SARS Coronavirus  2 by RT PCR NEGATIVE NEGATIVE Final    Comment: (NOTE) SARS-CoV-2 target nucleic acids are NOT DETECTED.  The SARS-CoV-2 RNA is generally detectable in upper respiratory specimens during the acute phase of infection. The lowest concentration of SARS-CoV-2 viral copies this assay can detect is 138 copies/mL. A negative result does not preclude SARS-Cov-2 infection and should not be used as the sole basis for treatment or other patient management decisions. A negative result may occur with  improper specimen collection/handling, submission of specimen other than nasopharyngeal swab, presence of viral mutation(s) within the areas targeted by this assay, and inadequate number of viral copies(<138 copies/mL). A negative result must be combined with clinical observations, patient history, and epidemiological information. The expected result is Negative.  Fact Sheet for Patients:  BloggerCourse.com  Fact Sheet for Healthcare Providers:   SeriousBroker.it  This test is no t yet approved or cleared by the [place] FDA and  has been authorized for detection and/or diagnosis of SARS-CoV-2 by FDA under an Emergency Use Authorization (EUA). This EUA will remain  in effect (meaning this test can be used) for the duration of the COVID-19 declaration under Section 564(b)(1) of the Act, 21 U.S.C.section 360bbb-3(b)(1), unless the authorization is terminated  or revoked sooner.       Influenza A by PCR NEGATIVE NEGATIVE Final   Influenza B by PCR NEGATIVE NEGATIVE Final    Comment: (NOTE) The Xpert Xpress SARS-CoV-2/FLU/RSV plus assay is intended as an aid in the diagnosis of influenza from Nasopharyngeal swab specimens and should not be used as a sole basis for treatment. Nasal washings and aspirates are unacceptable for Xpert Xpress SARS-CoV-2/FLU/RSV testing.  Fact Sheet for Patients: BloggerCourse.com  Fact Sheet for Healthcare Providers: SeriousBroker.it  This test is not yet approved or cleared by the [place] FDA and has been authorized for detection and/or diagnosis of SARS-CoV-2 by FDA under an Emergency Use Authorization (EUA). This EUA will remain in effect (meaning this test can be used) for the duration of the COVID-19 declaration under Section 564(b)(1) of the Act, 21 U.S.C. section 360bbb-3(b)(1), unless the authorization is terminated or revoked.  Performed at [facility], [address], [place], [place] [address]         Radiology Studies: CT Abdomen Pelvis Wo Contrast  Result Date: 09/28/2020 CLINICAL DATA:  Left leg pain EXAM: CT ABDOMEN AND PELVIS WITHOUT CONTRAST TECHNIQUE: Multidetector CT imaging of the abdomen and pelvis was performed following the standard protocol without IV contrast. COMPARISON:  None. FINDINGS: LOWER CHEST: Bibasilar atelectasis HEPATOBILIARY: Normal hepatic  contours. No intra- or extrahepatic biliary dilatation. The gallbladder is normal. PANCREAS: Normal pancreas. No ductal dilatation or peripancreatic fluid collection. SPLEEN: Normal. ADRENALS/URINARY TRACT: The adrenal glands are normal. No hydronephrosis, nephroureterolithiasis or solid renal mass. The urinary bladder is normal for degree of distention STOMACH/BOWEL: There is no hiatal hernia. Normal duodenal course and caliber. No small bowel dilatation or inflammation. No focal colonic abnormality. Normal appendix. VASCULAR/LYMPHATIC: There is calcific atherosclerosis of the abdominal aorta. No lymphadenopathy. REPRODUCTIVE: Normal prostate size with symmetric seminal vesicles. MUSCULOSKELETAL. No bony spinal canal stenosis or focal osseous abnormality. OTHER: Large intramuscular hematoma the left thigh is better characterized on concomitant CT of the left lower extremity. IMPRESSION: 1. Large intramuscular hematoma the left thigh is better characterized on concomitant CT of the left lower extremity. 2. No acute abnormality of the abdomen or pelvis. Aortic Atherosclerosis (ICD10-I70.0). Electronically Signed   By: Chrisandra Netters.D.  On: 09/28/2020 20:41   DG Lumbar Spine Complete  Result Date: 09/28/2020 CLINICAL DATA:  Fall with left leg pain. EXAM: LUMBAR SPINE - COMPLETE 4+ VIEW COMPARISON:  Remote radiograph 04/24/2008 FINDINGS: Again seen transitional lumbosacral anatomy. There is no evidence of fracture. Normal alignment. Vertebral body heights are normal. Minor endplate spurring at multiple levels. Disc space narrowing at the lumbosacral junction. Remaining disc spaces are preserved. Minor lower lumbar facet hypertrophy. Aorto bi-iliac atherosclerosis is age advanced. IMPRESSION: 1. No acute fracture or subluxation of the lumbar spine. 2. Mild spondylosis with endplate spurring and facet hypertrophy. 3. Age advanced aortoiliac atherosclerosis. Electronically Signed   By: Narda Rutherford M.D.   On:  09/28/2020 17:10   CT HEAD WO CONTRAST ( )  Result Date: 09/29/2020 CLINICAL DATA:  Mental status change, unknown cause EXAM: CT HEAD WITHOUT CONTRAST TECHNIQUE: Contiguous axial images were obtained from the base of the skull through the vertex without intravenous contrast. COMPARISON:  04/09/2013 FINDINGS: Brain: No acute intracranial abnormality. Specifically, no hemorrhage, hydrocephalus, mass lesion, acute infarction, or significant intracranial injury. Vascular: No hyperdense vessel or unexpected calcification. Skull: No acute calvarial abnormality. Sinuses/Orbits: Mucosal thickening in the ethmoid air cells. Air-fluid levels in the maxillary sinuses. Other: None IMPRESSION: No acute intracranial abnormality. Acute on chronic sinusitis. Electronically Signed   By: Charlett Nose M.D.   On: 09/29/2020 03:17   MR LUMBAR SPINE WO CONTRAST  Result Date: 09/29/2020 CLINICAL DATA:  Low back pain, cauda equina syndrome suspected EXAM: MRI LUMBAR SPINE WITHOUT CONTRAST TECHNIQUE: Multiplanar, multisequence MR imaging of the lumbar spine was performed. No intravenous contrast was administered. COMPARISON:  None. FINDINGS: Segmentation:  Standard. Alignment:  Physiologic. Vertebrae:  Mild discogenic endplate edema at L5 Conus medullaris and cauda equina: Conus extends to the L1 level. Conus and cauda equina appear normal. Paraspinal and other soft tissues: Edema within the right greater than left paraspinous muscles. No abnormal signal within the iliopsoas muscles. Disc levels: No spinal canal stenosis or neural impingement.  No disc herniation. IMPRESSION: 1. Edema within the right greater than left paraspinous muscles. No iliopsoas muscle signal abnormality. 2. Normal alignment. No spinal canal or neural foraminal stenosis. Electronically Signed   By: Deatra Robinson M.D.   On: 09/29/2020 03:32   MR PELVIS WO CONTRAST  Result Date: 09/29/2020 CLINICAL DATA:  Left buttock pain. History of cocaine abuse.  Elevated CK and potassium. EXAM: MRI PELVIS WITHOUT CONTRAST TECHNIQUE: Multiplanar multisequence MR imaging of the pelvis was performed. No intravenous contrast was administered. COMPARISON:  CT abdomen pelvis from yesterday. FINDINGS: Bones/Joint/Cartilage No suspicious marrow signal abnormality. No fracture or dislocation. Mild bilateral hip osteoarthritis. No joint effusion. Muscles and Tendons Prominent, patchy muscle edema involving the left gluteal muscles. Milder diffuse edema in the visualized lower paraspinous muscles. Edema within the right quadratus femoris muscle. Edema within the left adductor and hamstring muscles as described on separate MRI left thigh report from same day. Soft tissue No fluid collection or hematoma. No soft tissue mass. The visualized internal pelvic contents are unremarkable. IMPRESSION: 1. Prominent left gluteal and upper thigh muscle edema most consistent with rhabdomyolysis given clinical history. 2. Isolated edema within the right quadratus femoris muscle may be related to rhabdomyolysis as well, but can also be seen with ischiofemoral impingement 3. Mild bilateral hip osteoarthritis. Electronically Signed   By: Obie Dredge M.D.   On: 09/29/2020 05:17   MR FEMUR LEFT WO CONTRAST  Result Date: 09/29/2020 CLINICAL DATA:  Left thigh swelling. History  of cocaine abuse. Elevated CK and potassium. EXAM: MR OF THE LEFT FEMUR WITHOUT CONTRAST TECHNIQUE: Multiplanar, multisequence MR imaging of the left thigh was performed. No intravenous contrast was administered. COMPARISON:  CT left leg from same day. FINDINGS: Bones/Joint/Cartilage No marrow signal abnormality. No fracture or dislocation. No joint effusion. Muscles and Tendons Severe muscle edema diffusely involving the adductor and hamstring muscle compartments, as well as the visualized obturator, gluteus maximus, and gracilis muscles, with associated interfascial fluid. No increased T1 hyperintensity to suggest muscle  hemorrhage. Soft tissue Prominent soft tissue swelling in the medial and posterior thigh. No fluid collection or hematoma. No soft tissue mass. IMPRESSION: 1. Severe muscle edema involving the medial and posterior muscle compartments of the left thigh, most consistent with rhabdomyolysis given clinical history. Electronically Signed   By: Obie Dredge M.D.   On: 09/29/2020 05:09   CT EXTREMITY LOWER LEFT WO CONTRAST  Result Date: 09/28/2020 CLINICAL DATA:  Left leg pain and swelling. EXAM: CT OF THE LOWER LEFT EXTREMITY WITHOUT CONTRAST TECHNIQUE: Multidetector CT imaging of the lower left extremity was performed according to the standard protocol. COMPARISON:  None. FINDINGS: There is marked enlargement of the left thigh. the left adductor muscle compartment is markedly enlarged and the muscles appear edematous. There is also surrounding inflammatory changes and fluid. This process continues down into the posterior compartment of the thigh and terminates just above the knee. There is also some associated subcutaneous soft tissue swelling/edema/fluid. The anterior compartment of the knee is unremarkable. I do not see a discrete intramuscular hematoma. This has more the appearance of edema/myositis. Findings could be due to viral myositis, other post infectious myositis, muscle infarcts or drug related. Could not exclude compartment syndrome given the amount of swelling and edema. However, this is a clinical diagnosis. I do not see any significant findings below the knee. The femur is intact. The tibia and fibula are intact. Joint spaces are maintained. No findings suspicious for septic arthritis or osteomyelitis. Scattered arterial calcifications. IMPRESSION: 1. Marked enlargement of the left adductor and hamstring muscle compartments with surrounding inflammatory changes and intermuscular fluid. This has the appearance of edema/myositis. No definite hematoma. Findings could be due to viral myositis, other  post infectious myositis, muscle infarcts or drug related. Could not exclude compartment syndrome given the amount of swelling and edema. However, this is a clinical diagnosis. 2. No significant bony findings. Fracture, septic arthritis or osteomyelitis. Electronically Signed   By: Rudie Meyer M.D.   On: 09/28/2020 20:44   DG Hip Unilat W or Wo Pelvis 2-3 Views Left  Result Date: 09/28/2020 CLINICAL DATA:  Fall with left leg pain. EXAM: DG HIP (WITH OR WITHOUT PELVIS) 2-3V LEFT COMPARISON:  None. FINDINGS: There is mild left hip joint space narrowing and acetabular spurring. The femoral head is well seated. There is no evidence of fracture. The pubic rami are intact. Pubic symphysis and sacroiliac joints are congruent. No evidence of a vascular necrosis or focal bone abnormality. Vascular calcifications are seen. IMPRESSION: Mild left hip osteoarthritis. No acute fracture. Electronically Signed   By: Narda Rutherford M.D.   On: 09/28/2020 17:13        Scheduled Meds: Continuous Infusions:  sodium bicarbonate 150 mEq in D5W infusion 150 mL/hr at 09/29/20 0040     LOS: 1 day     Jacquelin Hawking, MD [facility] 09/29/2020, 3:36 PM  If 7PM-7AM, please contact night-coverage [email or web address]

## 2020-09-30 ENCOUNTER — Inpatient Hospital Stay (HOSPITAL_COMMUNITY): Payer: Medicaid Other

## 2020-09-30 DIAGNOSIS — S7402XA Injury of sciatic nerve at hip and thigh level, left leg, initial encounter: Secondary | ICD-10-CM

## 2020-09-30 DIAGNOSIS — F1410 Cocaine abuse, uncomplicated: Secondary | ICD-10-CM

## 2020-09-30 HISTORY — PX: IR US GUIDE VASC ACCESS RIGHT: IMG2390

## 2020-09-30 HISTORY — PX: IR FLUORO GUIDE CV LINE RIGHT: IMG2283

## 2020-09-30 LAB — URINALYSIS, ROUTINE W REFLEX MICROSCOPIC
Bilirubin Urine: NEGATIVE
Glucose, UA: 150 mg/dL — AB
Ketones, ur: NEGATIVE mg/dL
Leukocytes,Ua: NEGATIVE
Nitrite: NEGATIVE
Protein, ur: 100 mg/dL — AB
Specific Gravity, Urine: 1.011 (ref 1.005–1.030)
pH: 7 (ref 5.0–8.0)

## 2020-09-30 LAB — VITAMIN D 25 HYDROXY (VIT D DEFICIENCY, FRACTURES): Vit D, 25-Hydroxy: 11.06 ng/mL — ABNORMAL LOW (ref 30–100)

## 2020-09-30 LAB — COMPREHENSIVE METABOLIC PANEL
ALT: 369 U/L — ABNORMAL HIGH (ref 0–44)
AST: 954 U/L — ABNORMAL HIGH (ref 15–41)
Albumin: 2.1 g/dL — ABNORMAL LOW (ref 3.5–5.0)
Alkaline Phosphatase: 42 U/L (ref 38–126)
Anion gap: 12 (ref 5–15)
BUN: 63 mg/dL — ABNORMAL HIGH (ref 6–20)
CO2: 23 mmol/L (ref 22–32)
Calcium: 6.2 mg/dL — CL (ref 8.9–10.3)
Chloride: 96 mmol/L — ABNORMAL LOW (ref 98–111)
Creatinine, Ser: 7.75 mg/dL — ABNORMAL HIGH (ref 0.61–1.24)
GFR, Estimated: 8 mL/min — ABNORMAL LOW (ref >60)
Glucose, Bld: 108 mg/dL — ABNORMAL HIGH (ref 70–99)
Potassium: 4.9 mmol/L (ref 3.5–5.1)
Sodium: 131 mmol/L — ABNORMAL LOW (ref 135–145)
Total Bilirubin: 0.8 mg/dL (ref 0.3–1.2)
Total Protein: 4.8 g/dL — ABNORMAL LOW (ref 6.5–8.1)

## 2020-09-30 LAB — CBC
HCT: 30.9 % — ABNORMAL LOW (ref 39.0–52.0)
Hemoglobin: 11.1 g/dL — ABNORMAL LOW (ref 13.0–17.0)
MCH: 32 pg (ref 26.0–34.0)
MCHC: 35.9 g/dL (ref 30.0–36.0)
MCV: 89 fL (ref 80.0–100.0)
Platelets: 181 10*3/uL (ref 150–400)
RBC: 3.47 MIL/uL — ABNORMAL LOW (ref 4.22–5.81)
RDW: 13.5 % (ref 11.5–15.5)
WBC: 14.8 10*3/uL — ABNORMAL HIGH (ref 4.0–10.5)
nRBC: 0 % (ref 0.0–0.2)

## 2020-09-30 LAB — CK: Total CK: >50000 U/L — ABNORMAL HIGH (ref 49–397)

## 2020-09-30 MED ORDER — LIDOCAINE HCL 1 % IJ SOLN
INTRAMUSCULAR | Status: AC
  Filled 2020-09-30: qty 20

## 2020-09-30 MED ORDER — SIMETHICONE 80 MG PO CHEW
80.0000 mg | CHEWABLE_TABLET | Freq: Once | ORAL | Status: AC
  Administered 2020-09-30: 80 mg via ORAL
  Filled 2020-09-30: qty 1

## 2020-09-30 MED ORDER — HEPARIN SODIUM (PORCINE) 1000 UNIT/ML IJ SOLN
INTRAMUSCULAR | Status: AC
  Filled 2020-09-30: qty 4

## 2020-09-30 MED ORDER — HEPARIN SODIUM (PORCINE) 1000 UNIT/ML IJ SOLN
INTRAMUSCULAR | Status: AC
  Filled 2020-09-30: qty 1

## 2020-09-30 MED ORDER — LIDOCAINE HCL (PF) 1 % IJ SOLN
INTRAMUSCULAR | Status: AC | PRN
  Administered 2020-09-30: 10 mL

## 2020-09-30 MED ORDER — HEPARIN SODIUM (PORCINE) 1000 UNIT/ML IJ SOLN
INTRAMUSCULAR | Status: AC
  Filled 2020-09-30: qty 2

## 2020-09-30 MED ORDER — CHLORHEXIDINE GLUCONATE CLOTH 2 % EX PADS
6.0000 | MEDICATED_PAD | Freq: Every day | CUTANEOUS | Status: DC
  Administered 2020-10-01 – 2020-10-22 (×19): 6 via TOPICAL

## 2020-09-30 MED ORDER — CALCIUM CARBONATE ANTACID 500 MG PO CHEW: 1.0000 | CHEWABLE_TABLET | Freq: Three times a day (TID) | ORAL | Status: DC

## 2020-09-30 MED ORDER — CALCIUM GLUCONATE-NACL 1-0.675 GM/50ML-% IV SOLN
1.0000 g | Freq: Once | INTRAVENOUS | Status: AC
  Administered 2020-09-30: 1000 mg via INTRAVENOUS
  Filled 2020-09-30 (×2): qty 50

## 2020-09-30 NOTE — Evaluation (Signed)
Physical Therapy Evaluation Patient Details Name: Brett Butler MRN: [id number] DOB: 15-May-1965 Today's Date: 09/30/2020   History of Present Illness  Pt is a 55 y.o. male who presented 09/28/20 with lethargy s/p fall after cocaine use. pt found to have ARF and L lower extremity myositis with rhabdomyolosis and possible sciatic nerve damage. PMH: polysubstance abuse   Clinical Impression  Pt presents with condition above and deficits mentioned below, see PT Problem List. PTA, he was independent, living with his mother and sister in a one level house with 2 STE. Sister is the caregiver for their mother who has cancer, and sister just fractured her wrist. Currently, pt is displaying edema, overall weakness, and decreased sensation to touch inferior to the knee in the L lower extremity. Pt with no muscle activation in his anterior tibialis or gastrocs noted this date with palpation. These deficits impact his balance and coordination. He is requiring min-modA for bed mobility and modA for transfers and several small steps at EOB using a RW at this time. Pt would greatly benefit from intensive therapy in the CIR setting prior to return home to maximize his safety and independence with all functional mobility. Will continue to follow acutely.    Follow Up Recommendations CIR;Supervision for mobility/OOB    Equipment Recommendations  Rolling walker with 5" wheels    Recommendations for Other Services Rehab consult     Precautions / Restrictions Precautions Precautions: Fall Precaution Comments: back precautions to manage pain Restrictions Weight Bearing Restrictions: No      Mobility  Bed Mobility Overal bed mobility: Needs Assistance Bed Mobility: Rolling;Sidelying to Sit;Sit to Supine Rolling: Min assist Sidelying to sit: Mod assist   Sit to supine: Mod assist   General bed mobility comments: Assistance to flex L knee and initiate reach of L hand to R rail to pull to log roll. ModA  to manage legs and initiate trunk ascent to sit up. ModA to manage L leg back onto bed with return to supine.    Transfers Overall transfer level: Needs assistance Equipment used: Rolling walker (2 wheeled) Transfers: Sit to/from Stand Sit to Stand: Mod assist         General transfer comment: ModA and extra time with L knee blocked to transfer sit > stand from EOB > RW.  Ambulation/Gait Ambulation/Gait assistance: Mod assist Gait Distance (Feet): 2 Feet Assistive device: Rolling walker (2 wheeled) Gait Pattern/deviations: Step-to pattern;Decreased step length - left;Decreased weight shift to left;Decreased dorsiflexion - left;Decreased stride length;Trunk flexed Gait velocity: reduced Gait velocity interpretation: <1.31 ft/sec, indicative of household ambulator General Gait Details: Practiced lateral weight shifting > static marching > small lateral steps at EOB. Pt with trunk flexed. Verbal and tactile cues provided at L quad to extend during stance phase and then at hamstring to flex hip and knee with L step, toe drag noted.  Stairs            Wheelchair Mobility    Modified Rankin (Stroke Patients Only) Modified Rankin (Stroke Patients Only) Pre-Morbid Rankin Score: No symptoms Modified Rankin: Moderately severe disability     Balance Overall balance assessment: Needs assistance Sitting-balance support: Bilateral upper extremity supported;Feet supported Sitting balance-Leahy Scale: Poor Sitting balance - Comments: UE support to sit EOB, tends to lean to R to unweight L leg. Postural control: Right lateral lean Standing balance support: Bilateral upper extremity supported Standing balance-Leahy Scale: Poor Standing balance comment: Reliant on UE support and physical asisstance to stand.  Pertinent Vitals/Pain Pain Assessment: 0-10 Pain Score: 8  Pain Location: back and L thigh/hip Pain Descriptors / Indicators:  Discomfort;Grimacing;Guarding Pain Intervention(s): Limited activity within patient's tolerance;Monitored during session;Repositioned;Patient requesting pain meds-RN notified    Home Living Family/patient expects to be discharged to:: Private residence Living Arrangements: Other relatives;Parent (sister & mother) Available Help at Discharge: Family;Available PRN/intermittently Type of Home: House Home Access: Stairs to enter Entrance Stairs-Rails: None Entrance Stairs-Number of Steps: 2 Home Layout: One level Home Equipment: Bedside commode;Cane - single point      Prior Function Level of Independence: Independent         Comments: Pt does not drive. His sister can drive him to appointments. Pt does not work, on disability.     Hand Dominance   Dominant Hand: Right    Extremity/Trunk Assessment   Upper Extremity Assessment Upper Extremity Assessment: Overall WFL for tasks assessed    Lower Extremity Assessment Lower Extremity Assessment: LLE deficits/detail LLE Deficits / Details: MMT scores of < 2+ hip flexion, 4- knee extension, 0 ankle dorsiflexion and plantarflexion; intact sensation to light touch from knee and superior, slight numbness medial ankle, absent dorsal foot, big toe, and lateral ankle LLE Sensation: decreased light touch;decreased proprioception LLE Coordination: decreased fine motor;decreased gross motor    Cervical / Trunk Assessment Cervical / Trunk Assessment: Normal  Communication   Communication: No difficulties  Cognition Arousal/Alertness: Awake/alert Behavior During Therapy: WFL for tasks assessed/performed Overall Cognitive Status: Within Functional Limits for tasks assessed                                 General Comments: A&Ox4.      General Comments General comments (skin integrity, edema, etc.): Edema in L leg    Exercises General Exercises - Lower Extremity Mini-Sqauts: Strengthening;Both;10 reps;Standing (with  RW)   Assessment/Plan    PT Assessment Patient needs continued PT services  PT Problem List Decreased strength;Decreased range of motion;Decreased activity tolerance;Decreased balance;Decreased mobility;Decreased coordination;Decreased knowledge of use of DME;Decreased safety awareness;Impaired sensation       PT Treatment Interventions DME instruction;Gait training;Stair training;Functional mobility training;Therapeutic activities;Therapeutic exercise;Balance training;Neuromuscular re-education;Patient/family education    PT Goals (Current goals can be found in the Care Plan section)  Acute Rehab PT Goals Patient Stated Goal: to not worry his family PT Goal Formulation: With patient Time For Goal Achievement: 10/14/20 Potential to Achieve Goals: Good    Frequency Min 3X/week   Barriers to discharge        Co-evaluation               AM-PAC PT "6 Clicks" Mobility  Outcome Measure Help needed turning from your back to your side while in a flat bed without using bedrails?: A Little Help needed moving from lying on your back to sitting on the side of a flat bed without using bedrails?: A Lot Help needed moving to and from a bed to a chair (including a wheelchair)?: A Lot Help needed standing up from a chair using your arms (e.g., wheelchair or bedside chair)?: A Lot Help needed to walk in hospital room?: A Lot Help needed climbing 3-5 steps with a railing? : Total 6 Click Score: 12    End of Session Equipment Utilized During Treatment: Gait belt Activity Tolerance: Patient tolerated treatment well Patient left: in bed;with call bell/phone within reach;with bed alarm set (rolled to R) Nurse Communication: Mobility status;Patient requests pain meds PT  Visit Diagnosis: Other abnormalities of gait and mobility (R26.89);Unsteadiness on feet (R26.81);Muscle weakness (generalized) (M62.81);History of falling (Z91.81);Difficulty in walking, not elsewhere classified (R26.2);Other  symptoms and signs involving the nervous system (R29.898);Pain Pain - Right/Left: Left Pain - part of body: Hip (back)    Time: 6761-9509 PT Time Calculation (min) (ACUTE ONLY): 29 min   Charges:   PT Evaluation $PT Eval Moderate Complexity: 1 Mod PT Treatments $Gait Training: 8-22 mins        Souleymane Gurney, PT, DPT Acute Rehabilitation Services  Pager: [phone] Office: [phone]   Jewel Baize 09/30/2020, 4:40 PM

## 2020-09-30 NOTE — Progress Notes (Signed)
Agree with assessments this shift as documented by orientee, Mary Anthony, RN. 

## 2020-09-30 NOTE — Progress Notes (Addendum)
PROGRESS NOTE    Brett Butler  UEA:[id number] DOB: 1965/10/04 DOA: 09/28/2020 PCP: Lavinia Sharps, NP   Brief Narrative: Brett Butler is a 55 y.o. male with a history of polysubstance use including opiates, cocaine, cannabis in addition to schizoaffective disorder.  Patient presented secondary to being found to be lethargic by his friend.  Patient responded to Narcan when EMS arrived.  On arrival to the hospital, patient was noted to have a severe AKI and evidence of severe rhabdomyolysis.   Assessment & Plan:   Principal Problem:   ARF (acute renal failure) (HCC) Active Problems:   Rhabdomyolysis   Cocaine abuse (HCC)   Hyperkalemia   Leukocytosis   Injury of left sciatic nerve   AKI Likely pigment-induced injury from rhabdomyolysis.  Baseline creatinine of about 1.  Creatinine of 4.95 on admission with increase.  Patient started on IV fluids at 150 ml/hr with increase to 200 ml/hr by nephrology. Continued worsening of AKI with poor urine output. Urinalysis with significant hemoglobin/no RBCs. -Nephrology recommendations: Initiating HD today most likely but official recommendations pending -Strict ins/out, watch urine output very carefully  Rhabdomyolysis Secondary to to prolonged time down and resultant trauma.  Advanced imaging with evidence consistent with rhabdomyolysis.  With significant amount of edema noted on advanced imaging, orthopedic surgery was consulted with recommendations for no surgical management. CK remains stable. AST/ALT trending down. -Fluids as mentioned above -Daily CK, hepatic function testing  Left leg weakness Sciatic neuropathy In setting of prolonged time down and likely sciatic nerve damage. Concern there may not be full/any recovery, but patient will need physical therapy for best chance. May be a candidate for CIR. -PT/OT recommendations  Hyperkalemia Secondary to AKI.  Peak potassium 6.7.  Associated peaked T waves.  Treated with  Lokelma. Now resolved.  Hypocalcemia Correct calcium of about 7.7 today in setting of hypoalbuminemia. -Calcium gluconate -Check PTH, vitamin D levels  Demand ischemia Elevated troponin in setting of rhabdomyolysis.  Peak troponin of 140 with delta troponin of 120.  No chest pain.  Cocaine abuse Patient states that he snorts cocaine.  He thinks that the cocaine may have been laced with fentanyl. -Social work consult  Leukocytosis Likely reactive secondary to rhabdomyolysis.  Patient given Vancomycin, Flagyl and cefepime in the ED which have been discontinued. Blood cultures obtained on admission and are no growth to date.  WBC trending down.   DVT prophylaxis: SCDs Code Status:   Code Status: Full Code Family Communication: None at bedside Disposition Plan: Discharge home vs SNF in several days pending improvement of rhabdomyolysis, AKI, orthopedic surgery recommendations   Consultants:  Orthopedic surgery Neurology  Procedures:  None  Antimicrobials: Vancomycin IV Cefepime IV Flagyl IV    Subjective: Still with some left sided pain. Left sided weakness continues with left foot numbness below ankle  Objective: Vitals:   09/29/20 1948 09/29/20 2318 09/30/20 0325 09/30/20 0801  BP: (!) 114/100 (!) 141/90 130/77 (!) 161/89  Pulse: 87 84 84   Resp: Temp: 97.6 F (36.4 C) 99.2 F (37.3 C) 98.9 F (37.2 C) 98.3 F (36.8 C)  TempSrc: Oral Oral Oral Oral  SpO2: 94% 95% 96%   Weight:        Intake/Output Summary (Last 24 hours) at 09/30/2020 1002 Last data filed at 09/30/2020 0801 Gross per 24 hour  Intake 720 ml  Output 380 ml  Net 340 ml    Filed Weights   09/29/20 0500  Weight: 71.8  kg    Examination:  General exam: Appears calm and comfortable and in no acute distress. Conversant Respiratory: Clear to auscultation. Respiratory effort normal with no intercostal retractions or use of accessory muscles Cardiovascular: S1 & S2 heard, RRR. No  murmurs, rubs, gallops or clicks. Gastrointestinal: Abdomen is nondistended, soft and nontender. No masses felt. Normal bowel sounds heard Neurologic: Left lower extremity with 1/5 strength with attempted flexion. 0/5 strength of plantar/dorsiflexion. Musculoskeletal: No calf tenderness. Significant left buttock/hip/thigh edema with some left buttock tenderness Skin: No cyanosis. No new rashes Psychiatry: Alert and oriented. Memory intact. Mood & affect appropriate   Data Reviewed: I have personally reviewed following labs and imaging studies  CBC Lab Results  Component Value Date   WBC 14.8 (H) 09/30/2020   RBC 3.47 (L) 09/30/2020   HGB 11.1 (L) 09/30/2020   HCT 30.9 (L) 09/30/2020   MCV 89.0 09/30/2020   MCH 32.0 09/30/2020   PLT 181 09/30/2020   MCHC 35.9 09/30/2020   RDW 13.5 09/30/2020   LYMPHSABS 1.4 09/29/2020   MONOABS 1.5 (H) 09/29/2020   EOSABS 0.0 09/29/2020   BASOSABS 0.0 09/29/2020     Last metabolic panel Lab Results  Component Value Date   NA 131 (L) 09/30/2020   K 4.9 09/30/2020   CL 96 (L) 09/30/2020   CO2 23 09/30/2020   BUN 63 (H) 09/30/2020   CREATININE 7.75 (H) 09/30/2020   GLUCOSE 108 (H) 09/30/2020   GFRNONAA 8 (L) 09/30/2020   GFRAA >60 07/30/2019   CALCIUM 6.2 (LL) 09/30/2020   PROT 4.8 (L) 09/30/2020   ALBUMIN 2.1 (L) 09/30/2020   BILITOT 0.8 09/30/2020   ALKPHOS 42 09/30/2020   AST 954 (H) 09/30/2020   ALT 369 (H) 09/30/2020   ANIONGAP 12 09/30/2020    CBG (last 3)  No results for input(s): GLUCAP in the last 72 hours.   GFR: Estimated Creatinine Clearance: 10.3 mL/min (A) (by C-G formula based on SCr of 7.75 mg/dL (H)).  Coagulation Profile: No results for input(s): INR, PROTIME in the last 168 hours.  Recent Results (from the past 240 hour(s))  Culture, blood (Routine X 2) w Reflex to ID Panel     Status: None (Preliminary result)   Collection Time: 09/28/20  7:27 PM   Specimen: BLOOD  Result Value Ref Range Status    Specimen Description   Final    BLOOD RIGHT ANTECUBITAL Performed at [facility], [address] [place], [place] [address]    Special Requests   Final    Blood Culture results may not be optimal due to an inadequate volume of blood received in culture bottles BOTTLES DRAWN AEROBIC AND ANAEROBIC Performed at [facility], [address], [place], [place] [address]    Culture   Final    NO GROWTH < 12 HOURS Performed at [facility], [address] [place], [place] [address]    Report Status PENDING  Incomplete  Culture, blood (Routine X 2) w Reflex to ID Panel     Status: None (Preliminary result)   Collection Time: 09/28/20  7:32 PM   Specimen: BLOOD  Result Value Ref Range Status   Specimen Description   Final    BLOOD BLOOD RIGHT ARM Performed at [facility], [address] [place], [place] [address]    Special Requests   Final    Blood Culture results may not be optimal due to an inadequate volume of blood received in  culture bottles BOTTLES DRAWN AEROBIC AND ANAEROBIC Performed at [facility], [address], [place], [place] [address]    Culture   Final    NO GROWTH < 12 HOURS Performed at [facility], [address], [place], [place] [address]    Report Status PENDING  Incomplete  Resp Panel by RT-PCR (Flu A&B, Covid) Nasopharyngeal Swab     Status: None   Collection Time: 09/28/20  8:00 PM   Specimen: Nasopharyngeal Swab; Nasopharyngeal(NP) swabs in vial transport medium  Result Value Ref Range Status   SARS Coronavirus 2 by RT PCR NEGATIVE NEGATIVE Final    Comment: (NOTE) SARS-CoV-2 target nucleic acids are NOT DETECTED.  The SARS-CoV-2 RNA is generally detectable in upper respiratory specimens during the acute phase of infection. The lowest concentration of SARS-CoV-2 viral copies this assay can detect is 138 copies/mL. A negative result does not preclude  SARS-Cov-2 infection and should not be used as the sole basis for treatment or other patient management decisions. A negative result may occur with  improper specimen collection/handling, submission of specimen other than nasopharyngeal swab, presence of viral mutation(s) within the areas targeted by this assay, and inadequate number of viral copies(<138 copies/mL). A negative result must be combined with clinical observations, patient history, and epidemiological information. The expected result is Negative.  Fact Sheet for Patients:  BloggerCourse.com  Fact Sheet for Healthcare Providers:  SeriousBroker.it  This test is no t yet approved or cleared by the [place] FDA and  has been authorized for detection and/or diagnosis of SARS-CoV-2 by FDA under an Emergency Use Authorization (EUA). This EUA will remain  in effect (meaning this test can be used) for the duration of the COVID-19 declaration under Section 564(b)(1) of the Act, 21 U.S.C.section 360bbb-3(b)(1), unless the authorization is terminated  or revoked sooner.       Influenza A by PCR NEGATIVE NEGATIVE Final   Influenza B by PCR NEGATIVE NEGATIVE Final    Comment: (NOTE) The Xpert Xpress SARS-CoV-2/FLU/RSV plus assay is intended as an aid in the diagnosis of influenza from Nasopharyngeal swab specimens and should not be used as a sole basis for treatment. Nasal washings and aspirates are unacceptable for Xpert Xpress SARS-CoV-2/FLU/RSV testing.  Fact Sheet for Patients: BloggerCourse.com  Fact Sheet for Healthcare Providers: SeriousBroker.it  This test is not yet approved or cleared by the [place] FDA and has been authorized for detection and/or diagnosis of SARS-CoV-2 by FDA under an Emergency Use Authorization (EUA). This EUA will remain in effect (meaning this test can be used) for the duration of  the COVID-19 declaration under Section 564(b)(1) of the Act, 21 U.S.C. section 360bbb-3(b)(1), unless the authorization is terminated or revoked.  Performed at [facility], [address] [place], [place] [address]         Radiology Studies: CT Abdomen Pelvis Wo Contrast  Result Date: 09/28/2020 CLINICAL DATA:  Left leg pain EXAM: CT ABDOMEN AND PELVIS WITHOUT CONTRAST TECHNIQUE: Multidetector CT imaging of the abdomen and pelvis was performed following the standard protocol without IV contrast. COMPARISON:  None. FINDINGS: LOWER CHEST: Bibasilar atelectasis HEPATOBILIARY: Normal hepatic contours. No intra- or extrahepatic biliary dilatation. The gallbladder is normal. PANCREAS: Normal pancreas. No ductal dilatation or peripancreatic fluid collection. SPLEEN: Normal. ADRENALS/URINARY TRACT: The adrenal glands are normal. No hydronephrosis, nephroureterolithiasis or solid renal mass. The urinary bladder is normal for degree of distention STOMACH/BOWEL: There is no hiatal hernia. Normal duodenal course and caliber.  No small bowel dilatation or inflammation. No focal colonic abnormality. Normal appendix. VASCULAR/LYMPHATIC: There is calcific atherosclerosis of the abdominal aorta. No lymphadenopathy. REPRODUCTIVE: Normal prostate size with symmetric seminal vesicles. MUSCULOSKELETAL. No bony spinal canal stenosis or focal osseous abnormality. OTHER: Large intramuscular hematoma the left thigh is better characterized on concomitant CT of the left lower extremity. IMPRESSION: 1. Large intramuscular hematoma the left thigh is better characterized on concomitant CT of the left lower extremity. 2. No acute abnormality of the abdomen or pelvis. Aortic Atherosclerosis (ICD10-I70.0). Electronically Signed   By: Deatra Robinson M.D.   On: 09/28/2020 20:41   DG Lumbar Spine Complete  Result Date: 09/28/2020 CLINICAL DATA:  Fall with left leg pain. EXAM: LUMBAR SPINE - COMPLETE 4+ VIEW  COMPARISON:  Remote radiograph 04/24/2008 FINDINGS: Again seen transitional lumbosacral anatomy. There is no evidence of fracture. Normal alignment. Vertebral body heights are normal. Minor endplate spurring at multiple levels. Disc space narrowing at the lumbosacral junction. Remaining disc spaces are preserved. Minor lower lumbar facet hypertrophy. Aorto bi-iliac atherosclerosis is age advanced. IMPRESSION: 1. No acute fracture or subluxation of the lumbar spine. 2. Mild spondylosis with endplate spurring and facet hypertrophy. 3. Age advanced aortoiliac atherosclerosis. Electronically Signed   By: Narda Rutherford M.D.   On: 09/28/2020 17:10   CT HEAD WO CONTRAST ( )  Result Date: 09/29/2020 CLINICAL DATA:  Mental status change, unknown cause EXAM: CT HEAD WITHOUT CONTRAST TECHNIQUE: Contiguous axial images were obtained from the base of the skull through the vertex without intravenous contrast. COMPARISON:  04/09/2013 FINDINGS: Brain: No acute intracranial abnormality. Specifically, no hemorrhage, hydrocephalus, mass lesion, acute infarction, or significant intracranial injury. Vascular: No hyperdense vessel or unexpected calcification. Skull: No acute calvarial abnormality. Sinuses/Orbits: Mucosal thickening in the ethmoid air cells. Air-fluid levels in the maxillary sinuses. Other: None IMPRESSION: No acute intracranial abnormality. Acute on chronic sinusitis. Electronically Signed   By: Charlett Nose M.D.   On: 09/29/2020 03:17   MR LUMBAR SPINE WO CONTRAST  Result Date: 09/29/2020 CLINICAL DATA:  Low back pain, cauda equina syndrome suspected EXAM: MRI LUMBAR SPINE WITHOUT CONTRAST TECHNIQUE: Multiplanar, multisequence MR imaging of the lumbar spine was performed. No intravenous contrast was administered. COMPARISON:  None. FINDINGS: Segmentation:  Standard. Alignment:  Physiologic. Vertebrae:  Mild discogenic endplate edema at L5 Conus medullaris and cauda equina: Conus extends to the L1 level.  Conus and cauda equina appear normal. Paraspinal and other soft tissues: Edema within the right greater than left paraspinous muscles. No abnormal signal within the iliopsoas muscles. Disc levels: No spinal canal stenosis or neural impingement.  No disc herniation. IMPRESSION: 1. Edema within the right greater than left paraspinous muscles. No iliopsoas muscle signal abnormality. 2. Normal alignment. No spinal canal or neural foraminal stenosis. Electronically Signed   By: Deatra Robinson M.D.   On: 09/29/2020 03:32   MR PELVIS WO CONTRAST  Result Date: 09/29/2020 CLINICAL DATA:  Left buttock pain. History of cocaine abuse. Elevated CK and potassium. EXAM: MRI PELVIS WITHOUT CONTRAST TECHNIQUE: Multiplanar multisequence MR imaging of the pelvis was performed. No intravenous contrast was administered. COMPARISON:  CT abdomen pelvis from yesterday. FINDINGS: Bones/Joint/Cartilage No suspicious marrow signal abnormality. No fracture or dislocation. Mild bilateral hip osteoarthritis. No joint effusion. Muscles and Tendons Prominent, patchy muscle edema involving the left gluteal muscles. Milder diffuse edema in the visualized lower paraspinous muscles. Edema within the right quadratus femoris muscle. Edema within the left adductor and hamstring muscles as described on separate MRI left  thigh report from same day. Soft tissue No fluid collection or hematoma. No soft tissue mass. The visualized internal pelvic contents are unremarkable. IMPRESSION: 1. Prominent left gluteal and upper thigh muscle edema most consistent with rhabdomyolysis given clinical history. 2. Isolated edema within the right quadratus femoris muscle may be related to rhabdomyolysis as well, but can also be seen with ischiofemoral impingement 3. Mild bilateral hip osteoarthritis. Electronically Signed   By: Obie Dredge M.D.   On: 09/29/2020 05:17   MR FEMUR LEFT WO CONTRAST  Result Date: 09/29/2020 CLINICAL DATA:  Left thigh swelling. History  of cocaine abuse. Elevated CK and potassium. EXAM: MR OF THE LEFT FEMUR WITHOUT CONTRAST TECHNIQUE: Multiplanar, multisequence MR imaging of the left thigh was performed. No intravenous contrast was administered. COMPARISON:  CT left leg from same day. FINDINGS: Bones/Joint/Cartilage No marrow signal abnormality. No fracture or dislocation. No joint effusion. Muscles and Tendons Severe muscle edema diffusely involving the adductor and hamstring muscle compartments, as well as the visualized obturator, gluteus maximus, and gracilis muscles, with associated interfascial fluid. No increased T1 hyperintensity to suggest muscle hemorrhage. Soft tissue Prominent soft tissue swelling in the medial and posterior thigh. No fluid collection or hematoma. No soft tissue mass. IMPRESSION: 1. Severe muscle edema involving the medial and posterior muscle compartments of the left thigh, most consistent with rhabdomyolysis given clinical history. Electronically Signed   By: Obie Dredge M.D.   On: 09/29/2020 05:09   CT EXTREMITY LOWER LEFT WO CONTRAST  Result Date: 09/28/2020 CLINICAL DATA:  Left leg pain and swelling. EXAM: CT OF THE LOWER LEFT EXTREMITY WITHOUT CONTRAST TECHNIQUE: Multidetector CT imaging of the lower left extremity was performed according to the standard protocol. COMPARISON:  None. FINDINGS: There is marked enlargement of the left thigh. the left adductor muscle compartment is markedly enlarged and the muscles appear edematous. There is also surrounding inflammatory changes and fluid. This process continues down into the posterior compartment of the thigh and terminates just above the knee. There is also some associated subcutaneous soft tissue swelling/edema/fluid. The anterior compartment of the knee is unremarkable. I do not see a discrete intramuscular hematoma. This has more the appearance of edema/myositis. Findings could be due to viral myositis, other post infectious myositis, muscle infarcts or  drug related. Could not exclude compartment syndrome given the amount of swelling and edema. However, this is a clinical diagnosis. I do not see any significant findings below the knee. The femur is intact. The tibia and fibula are intact. Joint spaces are maintained. No findings suspicious for septic arthritis or osteomyelitis. Scattered arterial calcifications. IMPRESSION: 1. Marked enlargement of the left adductor and hamstring muscle compartments with surrounding inflammatory changes and intermuscular fluid. This has the appearance of edema/myositis. No definite hematoma. Findings could be due to viral myositis, other post infectious myositis, muscle infarcts or drug related. Could not exclude compartment syndrome given the amount of swelling and edema. However, this is a clinical diagnosis. 2. No significant bony findings. Fracture, septic arthritis or osteomyelitis. Electronically Signed   By: Rudie Meyer M.D.   On: 09/28/2020 20:44   DG Hip Unilat W or Wo Pelvis 2-3 Views Left  Result Date: 09/28/2020 CLINICAL DATA:  Fall with left leg pain. EXAM: DG HIP (WITH OR WITHOUT PELVIS) 2-3V LEFT COMPARISON:  None. FINDINGS: There is mild left hip joint space narrowing and acetabular spurring. The femoral head is well seated. There is no evidence of fracture. The pubic rami are intact. Pubic symphysis and  sacroiliac joints are congruent. No evidence of a vascular necrosis or focal bone abnormality. Vascular calcifications are seen. IMPRESSION: Mild left hip osteoarthritis. No acute fracture. Electronically Signed   By: Narda RutherfordMelanie  Sanford M.D.   On: 09/28/2020 17:13        Scheduled Meds:  Chlorhexidine Gluconate Cloth  6 each Topical Q0600   Continuous Infusions:  calcium gluconate       LOS: 2 days     Jacquelin Hawkingalph Tisheena Maguire, MD [facility] 09/30/2020, 10:02 AM  If 7PM-7AM, please contact night-coverage [email or web address]

## 2020-09-30 NOTE — Progress Notes (Signed)
Patient ID: Brett Butler, male   DOB: November 17, 1965, 55 y.o.   MRN: [id number] [facility] Progress Note   Assessment/ Plan:   1.  Acute kidney injury: This is secondary to pigment nephropathy/rhabdomyolysis.  Anuric and with dense renal injury.  Begin hemodialysis today for management of azotemia/myoglobin clearance as efforts at diuresis with volume expansion/furosemide were unsuccessful overnight.  He is status post right IJ temporary dialysis catheter placement by IR earlier today-I appreciate their assistance.  Will order for a second hemodialysis treatment again tomorrow with the anticipation to monitor him through the weekend for acute needs. 2.  Hyperkalemia: Secondary to rhabdomyolysis and associated acute kidney injury.  Corrected with medical management 3.  Rhabdomyolysis: Secondary to prolonged dependent posturing of the left lower extremity following drug use.  CPK level not improved overnight; will follow for another 24 hours to assess the impact of dialysis and if still does not improve, would recommend reassessing compartment pressures as rhabdomyolysis may be biphasic. 4.  Polysubstance abuse: Educated about the need for cessation/abstinence.  Subjective:   Reports discomfort of his left lower extremity/weakness especially exacerbated by movement.  Denies any chest pain or shortness of breath and just returned from IR for right IJ temporary dialysis catheter placement.   Objective:   BP (!) 161/89 (BP Location: Left Arm)   Pulse 84   Temp 98.9 F (37.2 C) (Oral)   Resp 16   Wt 71.8 kg   SpO2 96%   BMI 24.07 kg/m   Intake/Output Summary (Last 24 hours) at 09/30/2020 1156 Last data filed at 09/30/2020 0801 Gross per 24 hour  Intake 720 ml  Output 380 ml  Net 340 ml   Weight change:   Physical Exam: Gen: Appears uncomfortable resting in bed CVS: Pulse regular rhythm, normal rate, S1 and S2 normal Resp: Clear to auscultation bilaterally, no  rales/rhonchi Abd: Soft, flat, nontender, bowel sounds normal Ext: No lower extremity edema, left thigh/leg visibly larger than right and tender to palpation  Imaging: CT Abdomen Pelvis Wo Contrast  Result Date: 09/28/2020 CLINICAL DATA:  Left leg pain EXAM: CT ABDOMEN AND PELVIS WITHOUT CONTRAST TECHNIQUE: Multidetector CT imaging of the abdomen and pelvis was performed following the standard protocol without IV contrast. COMPARISON:  None. FINDINGS: LOWER CHEST: Bibasilar atelectasis HEPATOBILIARY: Normal hepatic contours. No intra- or extrahepatic biliary dilatation. The gallbladder is normal. PANCREAS: Normal pancreas. No ductal dilatation or peripancreatic fluid collection. SPLEEN: Normal. ADRENALS/URINARY TRACT: The adrenal glands are normal. No hydronephrosis, nephroureterolithiasis or solid renal mass. The urinary bladder is normal for degree of distention STOMACH/BOWEL: There is no hiatal hernia. Normal duodenal course and caliber. No small bowel dilatation or inflammation. No focal colonic abnormality. Normal appendix. VASCULAR/LYMPHATIC: There is calcific atherosclerosis of the abdominal aorta. No lymphadenopathy. REPRODUCTIVE: Normal prostate size with symmetric seminal vesicles. MUSCULOSKELETAL. No bony spinal canal stenosis or focal osseous abnormality. OTHER: Large intramuscular hematoma the left thigh is better characterized on concomitant CT of the left lower extremity. IMPRESSION: 1. Large intramuscular hematoma the left thigh is better characterized on concomitant CT of the left lower extremity. 2. No acute abnormality of the abdomen or pelvis. Aortic Atherosclerosis (ICD10-I70.0). Electronically Signed   By: Deatra Robinson M.D.   On: 09/28/2020 20:41   DG Lumbar Spine Complete  Result Date: 09/28/2020 CLINICAL DATA:  Fall with left leg pain. EXAM: LUMBAR SPINE - COMPLETE 4+ VIEW COMPARISON:  Remote radiograph 04/24/2008 FINDINGS: Again seen transitional lumbosacral anatomy. There is no  evidence of fracture.  Normal alignment. Vertebral body heights are normal. Minor endplate spurring at multiple levels. Disc space narrowing at the lumbosacral junction. Remaining disc spaces are preserved. Minor lower lumbar facet hypertrophy. Aorto bi-iliac atherosclerosis is age advanced. IMPRESSION: 1. No acute fracture or subluxation of the lumbar spine. 2. Mild spondylosis with endplate spurring and facet hypertrophy. 3. Age advanced aortoiliac atherosclerosis. Electronically Signed   By: Narda RutherfordMelanie  Sanford M.D.   On: 09/28/2020 17:10   CT HEAD WO CONTRAST (5MM)  Result Date: 09/29/2020 CLINICAL DATA:  Mental status change, unknown cause EXAM: CT HEAD WITHOUT CONTRAST TECHNIQUE: Contiguous axial images were obtained from the base of the skull through the vertex without intravenous contrast. COMPARISON:  04/09/2013 FINDINGS: Brain: No acute intracranial abnormality. Specifically, no hemorrhage, hydrocephalus, mass lesion, acute infarction, or significant intracranial injury. Vascular: No hyperdense vessel or unexpected calcification. Skull: No acute calvarial abnormality. Sinuses/Orbits: Mucosal thickening in the ethmoid air cells. Air-fluid levels in the maxillary sinuses. Other: None IMPRESSION: No acute intracranial abnormality. Acute on chronic sinusitis. Electronically Signed   By: Charlett NoseKevin  Dover M.D.   On: 09/29/2020 03:17   MR LUMBAR SPINE WO CONTRAST  Result Date: 09/29/2020 CLINICAL DATA:  Low back pain, cauda equina syndrome suspected EXAM: MRI LUMBAR SPINE WITHOUT CONTRAST TECHNIQUE: Multiplanar, multisequence MR imaging of the lumbar spine was performed. No intravenous contrast was administered. COMPARISON:  None. FINDINGS: Segmentation:  Standard. Alignment:  Physiologic. Vertebrae:  Mild discogenic endplate edema at L5 Conus medullaris and cauda equina: Conus extends to the L1 level. Conus and cauda equina appear normal. Paraspinal and other soft tissues: Edema within the right greater than  left paraspinous muscles. No abnormal signal within the iliopsoas muscles. Disc levels: No spinal canal stenosis or neural impingement.  No disc herniation. IMPRESSION: 1. Edema within the right greater than left paraspinous muscles. No iliopsoas muscle signal abnormality. 2. Normal alignment. No spinal canal or neural foraminal stenosis. Electronically Signed   By: Deatra RobinsonKevin  Herman M.D.   On: 09/29/2020 03:32   MR PELVIS WO CONTRAST  Result Date: 09/29/2020 CLINICAL DATA:  Left buttock pain. History of cocaine abuse. Elevated CK and potassium. EXAM: MRI PELVIS WITHOUT CONTRAST TECHNIQUE: Multiplanar multisequence MR imaging of the pelvis was performed. No intravenous contrast was administered. COMPARISON:  CT abdomen pelvis from yesterday. FINDINGS: Bones/Joint/Cartilage No suspicious marrow signal abnormality. No fracture or dislocation. Mild bilateral hip osteoarthritis. No joint effusion. Muscles and Tendons Prominent, patchy muscle edema involving the left gluteal muscles. Milder diffuse edema in the visualized lower paraspinous muscles. Edema within the right quadratus femoris muscle. Edema within the left adductor and hamstring muscles as described on separate MRI left thigh report from same day. Soft tissue No fluid collection or hematoma. No soft tissue mass. The visualized internal pelvic contents are unremarkable. IMPRESSION: 1. Prominent left gluteal and upper thigh muscle edema most consistent with rhabdomyolysis given clinical history. 2. Isolated edema within the right quadratus femoris muscle may be related to rhabdomyolysis as well, but can also be seen with ischiofemoral impingement 3. Mild bilateral hip osteoarthritis. Electronically Signed   By: Obie DredgeWilliam T Derry M.D.   On: 09/29/2020 05:17   MR FEMUR LEFT WO CONTRAST  Result Date: 09/29/2020 CLINICAL DATA:  Left thigh swelling. History of cocaine abuse. Elevated CK and potassium. EXAM: MR OF THE LEFT FEMUR WITHOUT CONTRAST TECHNIQUE:  Multiplanar, multisequence MR imaging of the left thigh was performed. No intravenous contrast was administered. COMPARISON:  CT left leg from same day. FINDINGS: Bones/Joint/Cartilage No marrow signal abnormality.  No fracture or dislocation. No joint effusion. Muscles and Tendons Severe muscle edema diffusely involving the adductor and hamstring muscle compartments, as well as the visualized obturator, gluteus maximus, and gracilis muscles, with associated interfascial fluid. No increased T1 hyperintensity to suggest muscle hemorrhage. Soft tissue Prominent soft tissue swelling in the medial and posterior thigh. No fluid collection or hematoma. No soft tissue mass. IMPRESSION: 1. Severe muscle edema involving the medial and posterior muscle compartments of the left thigh, most consistent with rhabdomyolysis given clinical history. Electronically Signed   By: Obie Dredge M.D.   On: 09/29/2020 05:09   IR Fluoro Guide CV Line Right  Result Date: 09/30/2020 INDICATION: 55 year old male referred for temporary hemodialysis catheter EXAM: IMAGE GUIDED TEMPORARY HEMODIALYSIS CATHETER MEDICATIONS: None ANESTHESIA/SEDATION: None FLUOROSCOPY TIME:  Fluoroscopy Time: 0 minutes 6 seconds (0 mGy). COMPLICATIONS: None PROCEDURE: Informed written consent was obtained from the patient after a thorough discussion of the procedural risks, benefits and alternatives. All questions were addressed. Maximal Sterile Barrier Technique was utilized including caps, mask, sterile gowns, sterile gloves, sterile drape, hand hygiene and skin antiseptic. A timeout was performed prior to the initiation of the procedure. The right neck and chest was prepped with chlorhexidine, and draped in the usual sterile fashion using maximum barrier technique (cap and mask, sterile gown, sterile gloves, large sterile sheet, hand hygiene and cutaneous antiseptic). Local anesthesia was attained by infiltration with 1% lidocaine without epinephrine.  Ultrasound demonstrated patency of the right internal jugular vein, and this was documented with an image. Under real-time ultrasound guidance, this vein was accessed with a 21 gauge micropuncture needle and image documentation was performed. A small dermatotomy was made at the access site with an 11 scalpel. A 0.018" wire was advanced into the SVC and the access needle exchanged for a 79F micropuncture vascular sheath. 035 wire was advanced into the IVC. A 15 cm catheter was selected. Skin and subcutaneous tissues were serially dilated. Catheter was placed on the wire. The catheter tip is positioned in the upper right atrium. This was documented with a spot image. Both ports of the hemodialysis catheter were then tested for excellent function. The ports were then locked with heparinized lock. Patient tolerated the procedure well and remained hemodynamically stable throughout. No complications were encountered and no significant blood loss was encountered. IMPRESSION: Status post right IJ temp HD catheter placement. Signed, Yvone Neu. Reyne Dumas, RPVI [facility] Electronically Signed   By: Gilmer Mor D.O.   On: 09/30/2020 11:13   IR US Guide Vasc Access Right  Result Date: 09/30/2020 INDICATION: 55 year old male referred for temporary hemodialysis catheter EXAM: IMAGE GUIDED TEMPORARY HEMODIALYSIS CATHETER MEDICATIONS: None ANESTHESIA/SEDATION: None FLUOROSCOPY TIME:  Fluoroscopy Time: 0 minutes 6 seconds (0 mGy). COMPLICATIONS: None PROCEDURE: Informed written consent was obtained from the patient after a thorough discussion of the procedural risks, benefits and alternatives. All questions were addressed. Maximal Sterile Barrier Technique was utilized including caps, mask, sterile gowns, sterile gloves, sterile drape, hand hygiene and skin antiseptic. A timeout was performed prior to the initiation of the procedure. The right neck and chest was prepped  with chlorhexidine, and draped in the usual sterile fashion using maximum barrier technique (cap and mask, sterile gown, sterile gloves, large sterile sheet, hand hygiene and cutaneous antiseptic). Local anesthesia was attained by infiltration with 1% lidocaine without epinephrine. Ultrasound demonstrated patency of the right internal jugular vein, and this was documented with an image. Under real-time ultrasound guidance, this  vein was accessed with a 21 gauge micropuncture needle and image documentation was performed. A small dermatotomy was made at the access site with an 11 scalpel. A 0.018" wire was advanced into the SVC and the access needle exchanged for a 31F micropuncture vascular sheath. 035 wire was advanced into the IVC. A 15 cm catheter was selected. Skin and subcutaneous tissues were serially dilated. Catheter was placed on the wire. The catheter tip is positioned in the upper right atrium. This was documented with a spot image. Both ports of the hemodialysis catheter were then tested for excellent function. The ports were then locked with heparinized lock. Patient tolerated the procedure well and remained hemodynamically stable throughout. No complications were encountered and no significant blood loss was encountered. IMPRESSION: Status post right IJ temp HD catheter placement. Signed, Yvone Neu. Reyne Dumas, RPVI [facility] Electronically Signed   By: Gilmer Mor D.O.   On: 09/30/2020 11:13   CT EXTREMITY LOWER LEFT WO CONTRAST  Result Date: 09/28/2020 CLINICAL DATA:  Left leg pain and swelling. EXAM: CT OF THE LOWER LEFT EXTREMITY WITHOUT CONTRAST TECHNIQUE: Multidetector CT imaging of the lower left extremity was performed according to the standard protocol. COMPARISON:  None. FINDINGS: There is marked enlargement of the left thigh. the left adductor muscle compartment is markedly enlarged and the muscles appear edematous. There is also  surrounding inflammatory changes and fluid. This process continues down into the posterior compartment of the thigh and terminates just above the knee. There is also some associated subcutaneous soft tissue swelling/edema/fluid. The anterior compartment of the knee is unremarkable. I do not see a discrete intramuscular hematoma. This has more the appearance of edema/myositis. Findings could be due to viral myositis, other post infectious myositis, muscle infarcts or drug related. Could not exclude compartment syndrome given the amount of swelling and edema. However, this is a clinical diagnosis. I do not see any significant findings below the knee. The femur is intact. The tibia and fibula are intact. Joint spaces are maintained. No findings suspicious for septic arthritis or osteomyelitis. Scattered arterial calcifications. IMPRESSION: 1. Marked enlargement of the left adductor and hamstring muscle compartments with surrounding inflammatory changes and intermuscular fluid. This has the appearance of edema/myositis. No definite hematoma. Findings could be due to viral myositis, other post infectious myositis, muscle infarcts or drug related. Could not exclude compartment syndrome given the amount of swelling and edema. However, this is a clinical diagnosis. 2. No significant bony findings. Fracture, septic arthritis or osteomyelitis. Electronically Signed   By: Rudie Meyer M.D.   On: 09/28/2020 20:44   DG Hip Unilat W or Wo Pelvis 2-3 Views Left  Result Date: 09/28/2020 CLINICAL DATA:  Fall with left leg pain. EXAM: DG HIP (WITH OR WITHOUT PELVIS) 2-3V LEFT COMPARISON:  None. FINDINGS: There is mild left hip joint space narrowing and acetabular spurring. The femoral head is well seated. There is no evidence of fracture. The pubic rami are intact. Pubic symphysis and sacroiliac joints are congruent. No evidence of a vascular necrosis or focal bone abnormality. Vascular calcifications are seen. IMPRESSION: Mild  left hip osteoarthritis. No acute fracture. Electronically Signed   By: Narda Rutherford M.D.   On: 09/28/2020 17:13    Labs: BMET Recent Labs  Lab 09/28/20 1800 09/29/20 0406 09/29/20 0702 09/29/20 1602 09/30/20 0220  NA 137 133*  --  132* 131*  K 6.5* 6.7* 5.4* 5.2* 4.9  CL 96* 99  --  93* 96*  CO2 19* 21*  --  23 23  GLUCOSE 128* 134*  --  112* 108*  BUN 34* 45*  --  55* 63*  CREATININE 4.95* 5.53*  --  6.88* 7.75*  CALCIUM 8.3* 6.7*  --  6.6* 6.2*   CBC Recent Labs  Lab 09/28/20 1617 09/29/20 0406 09/30/20 0220  WBC 27.3* 24.0* 14.8*  NEUTROABS 23.6* 20.9*  --   HGB 18.3* 14.1 11.1*  HCT 57.4* 40.8 30.9*  MCV 99.1 92.7 89.0  PLT 316 213 181   Medications:     Chlorhexidine Gluconate Cloth  6 each Topical Q0600   heparin sodium (porcine)       lidocaine       Zetta Bills, MD 09/30/2020, 11:56 AM

## 2020-09-30 NOTE — Progress Notes (Signed)
Critical lab of calcium 6.2 was called on the patient. MD paged to notify.

## 2020-09-30 NOTE — Procedures (Signed)
Interventional Radiology Procedure Note  Procedure: Placement of a right IJ approach temp HD cath.  15cm.    Tip is positioned at the superior cavoatrial junction and catheter is ready for immediate use.   Complications: None Recommendations:  - Ok to use - Do not submerge - Routine care   Signed,  Yvone Neu. Loreta Ave, DO

## 2020-09-30 NOTE — Progress Notes (Signed)
? ?  Inpatient Rehab Admissions Coordinator : ? ?Per therapy recommendations, patient was screened for CIR candidacy by Jewelz Ricklefs RN MSN.  At this time patient appears to be a potential candidate for CIR. I will place a rehab consult per protocol for full assessment. Please call me with any questions. ? ?Aleja Yearwood RN MSN ?Admissions Coordinator ?[phone] ?  ?

## 2020-09-30 NOTE — Plan of Care (Signed)

## 2020-10-01 LAB — RENAL FUNCTION PANEL
Albumin: 2 g/dL — ABNORMAL LOW (ref 3.5–5.0)
Anion gap: 13 (ref 5–15)
BUN: 71 mg/dL — ABNORMAL HIGH (ref 6–20)
CO2: 22 mmol/L (ref 22–32)
Calcium: 5.9 mg/dL — CL (ref 8.9–10.3)
Chloride: 95 mmol/L — ABNORMAL LOW (ref 98–111)
Creatinine, Ser: 9.39 mg/dL — ABNORMAL HIGH (ref 0.61–1.24)
GFR, Estimated: 6 mL/min — ABNORMAL LOW (ref >60)
Glucose, Bld: 105 mg/dL — ABNORMAL HIGH (ref 70–99)
Phosphorus: 7.6 mg/dL — ABNORMAL HIGH (ref 2.5–4.6)
Potassium: 4.5 mmol/L (ref 3.5–5.1)
Sodium: 130 mmol/L — ABNORMAL LOW (ref 135–145)

## 2020-10-01 LAB — HEPATIC FUNCTION PANEL
ALT: 345 U/L — ABNORMAL HIGH (ref 0–44)
AST: 808 U/L — ABNORMAL HIGH (ref 15–41)
Albumin: 2 g/dL — ABNORMAL LOW (ref 3.5–5.0)
Alkaline Phosphatase: 36 U/L — ABNORMAL LOW (ref 38–126)
Bilirubin, Direct: <0.1 mg/dL (ref 0.0–0.2)
Total Bilirubin: 1 mg/dL (ref 0.3–1.2)
Total Protein: 4.5 g/dL — ABNORMAL LOW (ref 6.5–8.1)

## 2020-10-01 LAB — PARATHYROID HORMONE, INTACT (NO CA): PTH: 172 pg/mL — ABNORMAL HIGH (ref 15–65)

## 2020-10-01 LAB — CK: Total CK: >50000 U/L — ABNORMAL HIGH (ref 49–397)

## 2020-10-01 MED ORDER — CALCIUM CARBONATE ANTACID 500 MG PO CHEW
1.0000 | CHEWABLE_TABLET | Freq: Three times a day (TID) | ORAL | Status: AC
  Administered 2020-10-01 (×2): 200 mg via ORAL
  Filled 2020-10-01 (×2): qty 1

## 2020-10-01 MED ORDER — CALCIUM GLUCONATE-NACL 2-0.675 GM/100ML-% IV SOLN
2.0000 g | Freq: Once | INTRAVENOUS | Status: AC
  Administered 2020-10-01: 2000 mg via INTRAVENOUS
  Filled 2020-10-01: qty 100

## 2020-10-01 MED ORDER — VITAMIN D (ERGOCALCIFEROL) 1.25 MG (50000 UNIT) PO CAPS
50000.0000 [IU] | ORAL_CAPSULE | ORAL | Status: DC
  Administered 2020-10-01 – 2020-10-15 (×2): 50000 [IU] via ORAL
  Filled 2020-10-01 (×4): qty 1

## 2020-10-01 MED ORDER — CHLORHEXIDINE GLUCONATE CLOTH 2 % EX PADS
6.0000 | MEDICATED_PAD | Freq: Every day | CUTANEOUS | Status: DC
  Administered 2020-10-01: 6 via TOPICAL

## 2020-10-01 NOTE — Progress Notes (Addendum)
[facility] NEPHROLOGY PROGRESS NOTE  Assessment/ Plan:  # Acute kidney injury: This is secondary to pigment nephropathy/rhabdomyolysis.  Anuric and with dense renal injury.  Started HD on 8/18 for management of azotemia/myoglobin clearance as efforts at diuresis with volume expansion/furosemide were unsuccessful.  He is status post right IJ temporary dialysis catheter placement by IR. He supposed to have HD yesterday but it was reported from night team that there was some issue with the catheter and unable to do dialysis.I will reconsult IR to have a look.   We will plan for  HD today as there is no evidence of renal recovery and he remains anuric.  Discussed with the nursing staff. Daily lab, strict ins and out and watch for renal recovery.  # Hyperkalemia: Secondary to rhabdomyolysis and associated acute kidney injury.  Corrected with medical management and HD.  #  Rhabdomyolysis, nontraumatic: Secondary to prolonged dependent posturing of the left lower extremity following drug use.  Starting dialysis as he developed AKI.  # Polysubstance abuse: Educated about the need for cessation/abstinence.   #Hypocalcemia: Repleted with calcium and vitamin D.  Monitor lab.  Subjective: Seen and examined at bedside.  He reports that there was problem with the catheter last night during dialysis.  Discussed with the bedside nurse who will contact IR to see if they can follow-up on this.  No urine output.  Denies nausea vomiting chest pain or shortness of breath. Objective Vital signs in last 24 hours: Vitals:   10/01/20 0313 10/01/20 0314 10/01/20 0319 10/01/20 0732  BP: 124/86   127/80  Pulse: 70   69  Resp: 14   15  Temp: 98 F (36.7 C)   98.4 F (36.9 C)  TempSrc: Axillary   Oral  SpO2: 94% 94%  94%  Weight:   78.8 kg    Weight change:   Intake/Output Summary (Last 24 hours) at 10/01/2020 1103 Last data filed at 10/01/2020 0900 Gross per 24 hour  Intake 711.64 ml  Output  92 ml  Net 619.64 ml       Labs: Basic Metabolic Panel: Recent Labs  Lab 09/29/20 1602 09/30/20 0220 10/01/20 0432  NA 132* 131* 130*  K 5.2* 4.9 4.5  CL 93* 96* 95*  CO2 23 23 22   GLUCOSE 112* 108* 105*  BUN 55* 63* 71*  CREATININE 6.88* 7.75* 9.39*  CALCIUM 6.6* 6.2* 5.9*  PHOS  --   --  7.6*   Liver Function Tests: Recent Labs  Lab 09/29/20 0406 09/30/20 0220 10/01/20 0432  AST 1,038* 954* 808*  ALT 412* 369* 345*  ALKPHOS 49 42 36*  BILITOT 0.8 0.8 1.0  PROT 5.6* 4.8* 4.5*  ALBUMIN 2.5* 2.1* 2.0*  2.0*   No results for input(s): LIPASE, AMYLASE in the last 168 hours. No results for input(s): AMMONIA in the last 168 hours. CBC: Recent Labs  Lab 09/28/20 1617 09/29/20 0406 09/30/20 0220  WBC 27.3* 24.0* 14.8*  NEUTROABS 23.6* 20.9*  --   HGB 18.3* 14.1 11.1*  HCT 57.4* 40.8 30.9*  MCV 99.1 92.7 89.0  PLT 316 213 181   Cardiac Enzymes: Recent Labs  Lab 09/28/20 1800 09/30/20 0220 10/01/20 0432  CKTOTAL >50,000* >50,000* >50,000*   CBG: No results for input(s): GLUCAP in the last 168 hours.  Iron Studies: No results for input(s): IRON, TIBC, TRANSFERRIN, FERRITIN in the last 72 hours. Studies/Results: IR Fluoro Guide CV Line Right  Result Date: 09/30/2020 INDICATION: 55 year old male referred for temporary hemodialysis  catheter EXAM: IMAGE GUIDED TEMPORARY HEMODIALYSIS CATHETER MEDICATIONS: None ANESTHESIA/SEDATION: None FLUOROSCOPY TIME:  Fluoroscopy Time: 0 minutes 6 seconds (0 mGy). COMPLICATIONS: None PROCEDURE: Informed written consent was obtained from the patient after a thorough discussion of the procedural risks, benefits and alternatives. All questions were addressed. Maximal Sterile Barrier Technique was utilized including caps, mask, sterile gowns, sterile gloves, sterile drape, hand hygiene and skin antiseptic. A timeout was performed prior to the initiation of the procedure. The right neck and chest was prepped with chlorhexidine, and  draped in the usual sterile fashion using maximum barrier technique (cap and mask, sterile gown, sterile gloves, large sterile sheet, hand hygiene and cutaneous antiseptic). Local anesthesia was attained by infiltration with 1% lidocaine without epinephrine. Ultrasound demonstrated patency of the right internal jugular vein, and this was documented with an image. Under real-time ultrasound guidance, this vein was accessed with a 21 gauge micropuncture needle and image documentation was performed. A small dermatotomy was made at the access site with an 11 scalpel. A 0.018" wire was advanced into the SVC and the access needle exchanged for a 76F micropuncture vascular sheath. 035 wire was advanced into the IVC. A 15 cm catheter was selected. Skin and subcutaneous tissues were serially dilated. Catheter was placed on the wire. The catheter tip is positioned in the upper right atrium. This was documented with a spot image. Both ports of the hemodialysis catheter were then tested for excellent function. The ports were then locked with heparinized lock. Patient tolerated the procedure well and remained hemodynamically stable throughout. No complications were encountered and no significant blood loss was encountered. IMPRESSION: Status post right IJ temp HD catheter placement. Signed, Yvone Neu. Reyne Dumas, RPVI [facility] Radiology Electronically Signed   By: Gilmer Mor D.O.   On: 09/30/2020 11:13   IR US Guide Vasc Access Right  Result Date: 09/30/2020 INDICATION: 55 year old male referred for temporary hemodialysis catheter EXAM: IMAGE GUIDED TEMPORARY HEMODIALYSIS CATHETER MEDICATIONS: None ANESTHESIA/SEDATION: None FLUOROSCOPY TIME:  Fluoroscopy Time: 0 minutes 6 seconds (0 mGy). COMPLICATIONS: None PROCEDURE: Informed written consent was obtained from the patient after a thorough discussion of the procedural risks, benefits and alternatives. All questions were  addressed. Maximal Sterile Barrier Technique was utilized including caps, mask, sterile gowns, sterile gloves, sterile drape, hand hygiene and skin antiseptic. A timeout was performed prior to the initiation of the procedure. The right neck and chest was prepped with chlorhexidine, and draped in the usual sterile fashion using maximum barrier technique (cap and mask, sterile gown, sterile gloves, large sterile sheet, hand hygiene and cutaneous antiseptic). Local anesthesia was attained by infiltration with 1% lidocaine without epinephrine. Ultrasound demonstrated patency of the right internal jugular vein, and this was documented with an image. Under real-time ultrasound guidance, this vein was accessed with a 21 gauge micropuncture needle and image documentation was performed. A small dermatotomy was made at the access site with an 11 scalpel. A 0.018" wire was advanced into the SVC and the access needle exchanged for a 76F micropuncture vascular sheath. 035 wire was advanced into the IVC. A 15 cm catheter was selected. Skin and subcutaneous tissues were serially dilated. Catheter was placed on the wire. The catheter tip is positioned in the upper right atrium. This was documented with a spot image. Both ports of the hemodialysis catheter were then tested for excellent function. The ports were then locked with heparinized lock. Patient tolerated the procedure well and remained hemodynamically stable throughout. No complications  were encountered and no significant blood loss was encountered. IMPRESSION: Status post right IJ temp HD catheter placement. Signed, Yvone Neu. Reyne Dumas, RPVI [facility] Radiology Electronically Signed   By: Gilmer Mor D.O.   On: 09/30/2020 11:13    Medications: Infusions:  calcium gluconate      Scheduled Medications:  calcium carbonate  1 tablet Oral TID   Chlorhexidine Gluconate Cloth  6 each Topical Q0600   Vitamin D  (Ergocalciferol)  50,000 Units Oral Q7 days    have reviewed scheduled and prn medications.  Physical Exam: General:NAD, sitting on chair comfortable Heart:RRR, s1s2 nl Lungs:clear b/l, no crackle Abdomen:soft, Non-tender, non-distended Extremities: Lower extremity pitting edema present Dialysis Access: Right IJ temporary HD catheter placed by IR on 8/18  Brett Butler Brett Butler 10/01/2020,11:03 AM  LOS: 3 days

## 2020-10-01 NOTE — Progress Notes (Signed)
Physical Therapy Treatment Patient Details Name: Brett Butler MRN: [id number] DOB: Nov 03, 1965 Today's Date: 10/01/2020    History of Present Illness Pt is a 55 y.o. male who presented 09/28/20 with lethargy s/p fall after cocaine use. pt found to have ARF and L lower extremity myositis with rhabdomyolosis and possible sciatic nerve damage. PMH: polysubstance abuse    PT Comments    Focused session on progressing L lower extremity strength and gait. Pt was able to display improved hip flexor strength with improved L leg lift to advance the foot during swing phase. However, he continues to display weakness impacting his knee stability anteriorly and posteriorly, needing blocking. Pt required modA for transfers and to ambulate up to ~10 ft with a RW today. No noted changes from eval in lower leg strength or sensation today. Will continue to follow acutely. Current recommendations remain appropriate.   Follow Up Recommendations  CIR;Supervision for mobility/OOB     Equipment Recommendations  Rolling walker with 5" wheels    Recommendations for Other Services       Precautions / Restrictions Precautions Precautions: Fall Precaution Comments: back precautions to manage pain Restrictions Weight Bearing Restrictions: No    Mobility  Bed Mobility Overal bed mobility: Needs Assistance Bed Mobility: Supine to Sit     Supine to sit: Mod assist     General bed mobility comments: ModA to manage legs and initiate trunk ascent to sit up.    Transfers Overall transfer level: Needs assistance Equipment used: Rolling walker (2 wheeled) Transfers: Sit to/from Stand Sit to Stand: Mod assist;+2 safety/equipment         General transfer comment: ModA and extra time with L knee blocked to transfer sit > stand from low surface EOB and recliner to RW  Ambulation/Gait Ambulation/Gait assistance: Mod assist;+2 safety/equipment Gait Distance (Feet): 10 Feet (x2 bouts of ~2 ft > ~10  ft) Assistive device: Rolling walker (2 wheeled) Gait Pattern/deviations: Step-to pattern;Decreased step length - left;Decreased weight shift to left;Decreased dorsiflexion - left;Decreased stride length;Trunk flexed Gait velocity: reduced Gait velocity interpretation: <1.31 ft/sec, indicative of household ambulator General Gait Details: Physical assistance along with verbal and tactile cues provided at L quad during stance to extend (blocking posterior aspect of knee to prevent hyperextension), hips to weight shift, L hamstring to lift leg to advance with swing phase and place safely. Improved initiation of L leg lift off floor to step this date. Chair follow provided.   Stairs             Wheelchair Mobility    Modified Rankin (Stroke Patients Only) Modified Rankin (Stroke Patients Only) Pre-Morbid Rankin Score: No symptoms Modified Rankin: Moderately severe disability     Balance Overall balance assessment: Needs assistance Sitting-balance support: Bilateral upper extremity supported;Feet supported Sitting balance-Leahy Scale: Poor Sitting balance - Comments: UE support to sit EOB, tends to lean to R to unweight L leg. Postural control: Right lateral lean Standing balance support: Bilateral upper extremity supported Standing balance-Leahy Scale: Poor Standing balance comment: Reliant on UE support and physical asisstance to stand.                            Cognition Arousal/Alertness: Awake/alert Behavior During Therapy: WFL for tasks assessed/performed Overall Cognitive Status: Within Functional Limits for tasks assessed  Exercises General Exercises - Lower Extremity Long Arc Quad: Strengthening;Left;Seated;5 reps Hip Flexion/Marching: Strengthening;Left;10 reps;Seated Mini-Sqauts: Strengthening;Both;Standing;5 reps (with RW, placing R foot anterior to L to increase L utilization) Other  Exercises Other Exercises: PROM to L ankle into dorsiflexion    General Comments General comments (skin integrity, edema, etc.): placed order for L prevalon boot and notified RN      Pertinent Vitals/Pain Pain Assessment: Faces Faces Pain Scale: Hurts even more Pain Location: back and L thigh/hip Pain Descriptors / Indicators: Discomfort;Grimacing;Guarding Pain Intervention(s): Limited activity within patient's tolerance;Monitored during session;Repositioned;Premedicated before session    Home Living                      Prior Function            PT Goals (current goals can now be found in the care plan section) Acute Rehab PT Goals Patient Stated Goal: to walk PT Goal Formulation: With patient Time For Goal Achievement: 10/14/20 Potential to Achieve Goals: Good Progress towards PT goals: Progressing toward goals    Frequency    Min 3X/week      PT Plan Current plan remains appropriate    Co-evaluation              AM-PAC PT "6 Clicks" Mobility   Outcome Measure  Help needed turning from your back to your side while in a flat bed without using bedrails?: A Little Help needed moving from lying on your back to sitting on the side of a flat bed without using bedrails?: A Lot Help needed moving to and from a bed to a chair (including a wheelchair)?: A Lot Help needed standing up from a chair using your arms (e.g., wheelchair or bedside chair)?: A Lot Help needed to walk in hospital room?: A Lot Help needed climbing 3-5 steps with a railing? : Total 6 Click Score: 12    End of Session Equipment Utilized During Treatment: Gait belt Activity Tolerance: Patient tolerated treatment well Patient left: with call bell/phone within reach;in chair;with chair alarm set Nurse Communication: Mobility status;Other (comment) (need for L prevalon boot) PT Visit Diagnosis: Other abnormalities of gait and mobility (R26.89);Unsteadiness on feet (R26.81);Muscle weakness  (generalized) (M62.81);History of falling (Z91.81);Difficulty in walking, not elsewhere classified (R26.2);Other symptoms and signs involving the nervous system (R29.898);Pain Pain - Right/Left: Left Pain - part of body: Hip (back)     Time: 3244-0102 PT Time Calculation (min) (ACUTE ONLY): 26 min  Charges:  $Gait Training: 8-22 mins $Therapeutic Exercise: 8-22 mins                     Sourish Gurney, PT, DPT Acute Rehabilitation Services  Pager: [phone] Office: [phone]    Jewel Baize 10/01/2020, 4:08 PM

## 2020-10-01 NOTE — Plan of Care (Signed)

## 2020-10-01 NOTE — Progress Notes (Signed)
Inpatient Rehabilitation Admissions Coordinator   Inpatient rehab consult received, I met with patient at bedside for assessment., I await further medical workup and therapy progress to assist with planning discharge needs. I will follow up next week.  Danne Baxter, RN, MSN Rehab Admissions Coordinator [phone] 10/01/2020 1:29 PM

## 2020-10-01 NOTE — Progress Notes (Signed)
55 y.o. male  History of polysubstance abuse with no significant medical history. Presented to the ED at [facility] after syncopal episode. Found to be in AKI. IR placed a RIJ temp HD catheter placed by IR on 8.19.22. Team reports high pressure with the venous side during dialysis. Patient seen at bedside. Temp cath appears to be unremarkable. Images from placement on 8.18.22 show the catheter to be in a good position. Catheter able to be flushed and irrigated on both the venous and arterial side without issue. Patient to stable from IR perspective s/p temp HD cathter placement. Team please call IR with questions or concerns.

## 2020-10-01 NOTE — Progress Notes (Signed)
PROGRESS NOTE    Brett Butler  ZOX:[id number] DOB: 04/30/1965 DOA: 09/28/2020 PCP: Lavinia SharpsPlacey, Brett Ann, NP   Brief Narrative: Brett BarlowRaymond Butler is a 55 y.o. male with a history of polysubstance use including opiates, cocaine, cannabis in addition to schizoaffective disorder.  Patient presented secondary to being found to be lethargic by his friend.  Patient responded to Narcan when EMS arrived.  On arrival to the hospital, patient was noted to have a severe AKI and evidence of severe rhabdomyolysis.   Assessment & Plan:   Principal Problem:   ARF (acute renal failure) (HCC) Active Problems:   Rhabdomyolysis   Cocaine abuse (HCC)   Hyperkalemia   Leukocytosis   Injury of left sciatic nerve   AKI Likely pigment-induced injury from rhabdomyolysis.  Baseline creatinine of about 1.  Creatinine of 4.95 on admission with continued increase.  Patient started on IV fluids at 150 ml/hr with increase to 200 ml/hr by nephrology with no improvement in creatinine or urine output. Urinalysis with significant hemoglobin/no RBCs. Temporary HD catheter placed on 8/18 and HD initiated the same day, although he was unable to complete secondary to malfunctioning line -Nephrology recommendations: Continued HD, pending today -Strict ins/out, watch urine output very carefully  Rhabdomyolysis Secondary to to prolonged time down and resultant trauma.  Advanced imaging with evidence consistent with rhabdomyolysis.  With significant amount of edema noted on advanced imaging, orthopedic surgery was consulted with recommendations for no surgical management. CK remains stable. AST/ALT trending down. -Fluids as mentioned above -Daily CK, hepatic function testing  Left leg weakness Sciatic neuropathy In setting of prolonged time down and likely sciatic nerve damage. Concern there may not be full/any recovery, but patient will need physical therapy for best chance. PT/OT recommending CIR.  Hyperkalemia Secondary  to AKI.  Peak potassium 6.7.  Associated peaked T waves.  Treated with Lokelma. Now resolved.  Hypocalcemia Correct calcium of about 7.5 today in setting of hypoalbuminemia. PTH elevated with low 25-vitamin D -Check 1,25 vitamin D in setting of kidney impairment -Calcium gluconate again today in addition to Tums -Vitamin D 50,000 units weekly  Demand ischemia Elevated troponin in setting of rhabdomyolysis.  Peak troponin of 140 with delta troponin of 120.  No chest pain.  Cocaine abuse Patient states that he snorts cocaine.  He thinks that the cocaine may have been laced with fentanyl. -Social work consult  Leukocytosis Likely reactive secondary to rhabdomyolysis.  Patient given Vancomycin, Flagyl and cefepime in the ED which have been discontinued. Blood cultures obtained on admission and are no growth to date.  WBC trending down.   DVT prophylaxis: SCDs Code Status:   Code Status: Full Code Family Communication: None at bedside Disposition Plan: Discharge likely to CIR in several days pending improvement of rhabdomyolysis, AKI/nephrology recommendations   Consultants:  Orthopedic surgery Neurology  Procedures:  None  Antimicrobials: Vancomycin IV Cefepime IV Flagyl IV    Subjective: No issues overnight. Some improvement in left leg strength  Objective: Vitals:   10/01/20 0312 10/01/20 0313 10/01/20 0314 10/01/20 0319  BP: 124/86 124/86    Pulse: 71 70    Resp: 15 14    Temp: 98.1 F (36.7 C) 98 F (36.7 C)    TempSrc: Axillary Axillary    SpO2: 94% 94% 94%   Weight:    78.8 kg    Intake/Output Summary (Last 24 hours) at 10/01/2020 0733 Last data filed at 10/01/2020 0700 Gross per 24 hour  Intake 461.64 ml  Output 130  ml  Net 331.64 ml    Filed Weights   09/29/20 0500 09/30/20 1925 10/01/20 0319  Weight: 71.8 kg 72 kg 78.8 kg    Examination:  General exam: Appears calm and comfortable and in no acute distress. Conversant Respiratory: Clear to  auscultation. Respiratory effort normal with no intercostal retractions or use of accessory muscles Cardiovascular: S1 & S2 heard, RRR. No murmurs, rubs, gallops or clicks. LLE 2+ edema up thi left buttock Gastrointestinal: Abdomen is nondistended, soft and nontender. No masses felt. Normal bowel sounds heard Neurologic: Left LE with 1/5 strength Musculoskeletal: No calf tenderness Skin: No cyanosis. No new rashes Psychiatry: Alert and oriented. Memory intact. Mood & affect appropriate   Data Reviewed: I have personally reviewed following labs and imaging studies  CBC Lab Results  Component Value Date   WBC 14.8 (H) 09/30/2020   RBC 3.47 (L) 09/30/2020   HGB 11.1 (L) 09/30/2020   HCT 30.9 (L) 09/30/2020   MCV 89.0 09/30/2020   MCH 32.0 09/30/2020   PLT 181 09/30/2020   MCHC 35.9 09/30/2020   RDW 13.5 09/30/2020   LYMPHSABS 1.4 09/29/2020   MONOABS 1.5 (H) 09/29/2020   EOSABS 0.0 09/29/2020   BASOSABS 0.0 09/29/2020     Last metabolic panel Lab Results  Component Value Date   NA 130 (L) 10/01/2020   K 4.5 10/01/2020   CL 95 (L) 10/01/2020   CO2 22 10/01/2020   BUN 71 (H) 10/01/2020   CREATININE 9.39 (H) 10/01/2020   GLUCOSE 105 (H) 10/01/2020   GFRNONAA 6 (L) 10/01/2020   GFRAA >60 07/30/2019   CALCIUM 5.9 (LL) 10/01/2020   PHOS 7.6 (H) 10/01/2020   PROT 4.5 (L) 10/01/2020   ALBUMIN 2.0 (L) 10/01/2020   ALBUMIN 2.0 (L) 10/01/2020   BILITOT 1.0 10/01/2020   ALKPHOS 36 (L) 10/01/2020   AST 808 (H) 10/01/2020   ALT 345 (H) 10/01/2020   ANIONGAP 13 10/01/2020    CBG (last 3)  No results for input(s): GLUCAP in the last 72 hours.   GFR: Estimated Creatinine Clearance: 8.5 mL/min (A) (by C-G formula based on SCr of 9.39 mg/dL (H)).  Coagulation Profile: No results for input(s): INR, PROTIME in the last 168 hours.  Recent Results (from the past 240 hour(s))  Culture, blood (Routine X 2) w Reflex to ID Panel     Status: None (Preliminary result)   Collection  Time: 09/28/20  7:27 PM   Specimen: BLOOD  Result Value Ref Range Status   Specimen Description   Final    BLOOD RIGHT ANTECUBITAL Performed at [facility], [address], [place], [place] [address]    Special Requests   Final    Blood Culture results may not be optimal due to an inadequate volume of blood received in culture bottles BOTTLES DRAWN AEROBIC AND ANAEROBIC Performed at [facility], [address] [place], [place] [address]    Culture   Final    NO GROWTH 2 DAYS Performed at [facility], [address], [place], [place] [address]    Report Status PENDING  Incomplete  Culture, blood (Routine X 2) w Reflex to ID Panel     Status: None (Preliminary result)   Collection Time: 09/28/20  7:32 PM   Specimen: BLOOD  Result Value Ref Range Status   Specimen Description   Final    BLOOD BLOOD RIGHT ARM Performed at [facility], [address] [place], [place] [address]  Special Requests   Final    Blood Culture results may not be optimal due to an inadequate volume of blood received in culture bottles BOTTLES DRAWN AEROBIC AND ANAEROBIC Performed at [facility], [address], [place], [place] [address]    Culture   Final    NO GROWTH 2 DAYS Performed at [facility], [address] [place], [place] [address]    Report Status PENDING  Incomplete  Resp Panel by RT-PCR (Flu A&B, Covid) Nasopharyngeal Swab     Status: None   Collection Time: 09/28/20  8:00 PM   Specimen: Nasopharyngeal Swab; Nasopharyngeal(NP) swabs in vial transport medium  Result Value Ref Range Status   SARS Coronavirus 2 by RT PCR NEGATIVE NEGATIVE Final    Comment: (NOTE) SARS-CoV-2 target nucleic acids are NOT DETECTED.  The SARS-CoV-2 RNA is generally detectable in upper respiratory specimens during the acute phase of infection. The lowest concentration of SARS-CoV-2 viral copies this assay can detect  is 138 copies/mL. A negative result does not preclude SARS-Cov-2 infection and should not be used as the sole basis for treatment or other patient management decisions. A negative result may occur with  improper specimen collection/handling, submission of specimen other than nasopharyngeal swab, presence of viral mutation(s) within the areas targeted by this assay, and inadequate number of viral copies(<138 copies/mL). A negative result must be combined with clinical observations, patient history, and epidemiological information. The expected result is Negative.  Fact Sheet for Patients:  BloggerCourse.com  Fact Sheet for Healthcare Providers:  SeriousBroker.it  This test is no t yet approved or cleared by the [place] FDA and  has been authorized for detection and/or diagnosis of SARS-CoV-2 by FDA under an Emergency Use Authorization (EUA). This EUA will remain  in effect (meaning this test can be used) for the duration of the COVID-19 declaration under Section 564(b)(1) of the Act, 21 U.S.C.section 360bbb-3(b)(1), unless the authorization is terminated  or revoked sooner.       Influenza A by PCR NEGATIVE NEGATIVE Final   Influenza B by PCR NEGATIVE NEGATIVE Final    Comment: (NOTE) The Xpert Xpress SARS-CoV-2/FLU/RSV plus assay is intended as an aid in the diagnosis of influenza from Nasopharyngeal swab specimens and should not be used as a sole basis for treatment. Nasal washings and aspirates are unacceptable for Xpert Xpress SARS-CoV-2/FLU/RSV testing.  Fact Sheet for Patients: BloggerCourse.com  Fact Sheet for Healthcare Providers: SeriousBroker.it  This test is not yet approved or cleared by the [place] FDA and has been authorized for detection and/or diagnosis of SARS-CoV-2 by FDA under an Emergency Use Authorization (EUA). This EUA will remain in effect  (meaning this test can be used) for the duration of the COVID-19 declaration under Section 564(b)(1) of the Act, 21 U.S.C. section 360bbb-3(b)(1), unless the authorization is terminated or revoked.  Performed at [facility], [address], [place], [place] [address]         Radiology Studies: IR Fluoro Guide CV Line Right  Result Date: 09/30/2020 INDICATION: 55 year old male referred for temporary hemodialysis catheter EXAM: IMAGE GUIDED TEMPORARY HEMODIALYSIS CATHETER MEDICATIONS: None ANESTHESIA/SEDATION: None FLUOROSCOPY TIME:  Fluoroscopy Time: 0 minutes 6 seconds (0 mGy). COMPLICATIONS: None PROCEDURE: Informed written consent was obtained from the patient after a thorough discussion of the procedural risks, benefits and alternatives. All questions were addressed. Maximal Sterile Barrier Technique was utilized including caps, mask, sterile gowns, sterile gloves, sterile drape, hand hygiene and skin antiseptic.  A timeout was performed prior to the initiation of the procedure. The right neck and chest was prepped with chlorhexidine, and draped in the usual sterile fashion using maximum barrier technique (cap and mask, sterile gown, sterile gloves, large sterile sheet, hand hygiene and cutaneous antiseptic). Local anesthesia was attained by infiltration with 1% lidocaine without epinephrine. Ultrasound demonstrated patency of the right internal jugular vein, and this was documented with an image. Under real-time ultrasound guidance, this vein was accessed with a 21 gauge micropuncture needle and image documentation was performed. A small dermatotomy was made at the access site with an 11 scalpel. A 0.018" wire was advanced into the SVC and the access needle exchanged for a 41F micropuncture vascular sheath. 035 wire was advanced into the IVC. A 15 cm catheter was selected. Skin and subcutaneous tissues were serially dilated. Catheter was placed on the wire. The catheter tip is  positioned in the upper right atrium. This was documented with a spot image. Both ports of the hemodialysis catheter were then tested for excellent function. The ports were then locked with heparinized lock. Patient tolerated the procedure well and remained hemodynamically stable throughout. No complications were encountered and no significant blood loss was encountered. IMPRESSION: Status post right IJ temp HD catheter placement. Signed, Yvone Neu. Reyne Dumas, RPVI [facility] Electronically Signed   By: Gilmer Mor D.O.   On: 09/30/2020 11:13   IR US Guide Vasc Access Right  Result Date: 09/30/2020 INDICATION: 55 year old male referred for temporary hemodialysis catheter EXAM: IMAGE GUIDED TEMPORARY HEMODIALYSIS CATHETER MEDICATIONS: None ANESTHESIA/SEDATION: None FLUOROSCOPY TIME:  Fluoroscopy Time: 0 minutes 6 seconds (0 mGy). COMPLICATIONS: None PROCEDURE: Informed written consent was obtained from the patient after a thorough discussion of the procedural risks, benefits and alternatives. All questions were addressed. Maximal Sterile Barrier Technique was utilized including caps, mask, sterile gowns, sterile gloves, sterile drape, hand hygiene and skin antiseptic. A timeout was performed prior to the initiation of the procedure. The right neck and chest was prepped with chlorhexidine, and draped in the usual sterile fashion using maximum barrier technique (cap and mask, sterile gown, sterile gloves, large sterile sheet, hand hygiene and cutaneous antiseptic). Local anesthesia was attained by infiltration with 1% lidocaine without epinephrine. Ultrasound demonstrated patency of the right internal jugular vein, and this was documented with an image. Under real-time ultrasound guidance, this vein was accessed with a 21 gauge micropuncture needle and image documentation was performed. A small dermatotomy was made at the access site with an 11 scalpel. A  0.018" wire was advanced into the SVC and the access needle exchanged for a 41F micropuncture vascular sheath. 035 wire was advanced into the IVC. A 15 cm catheter was selected. Skin and subcutaneous tissues were serially dilated. Catheter was placed on the wire. The catheter tip is positioned in the upper right atrium. This was documented with a spot image. Both ports of the hemodialysis catheter were then tested for excellent function. The ports were then locked with heparinized lock. Patient tolerated the procedure well and remained hemodynamically stable throughout. No complications were encountered and no significant blood loss was encountered. IMPRESSION: Status post right IJ temp HD catheter placement. Signed, Yvone Neu. Reyne Dumas, RPVI [facility] Electronically Signed   By: Gilmer Mor D.O.   On: 09/30/2020 11:13        Scheduled Meds:  calcium carbonate  1 tablet Oral TID   Chlorhexidine Gluconate Cloth  6 each Topical Q0600   heparin sodium (porcine)       Continuous Infusions:  calcium gluconate       LOS: 3 days     Jacquelin Hawking, MD [facility] 10/01/2020, 7:33 AM  If 7PM-7AM, please contact night-coverage [email or web address]

## 2020-10-01 NOTE — Progress Notes (Signed)
Received critical lab value on calcium 5.9; will notify provider.

## 2020-10-02 DIAGNOSIS — E559 Vitamin D deficiency, unspecified: Secondary | ICD-10-CM

## 2020-10-02 LAB — RENAL FUNCTION PANEL
Albumin: 2.3 g/dL — ABNORMAL LOW (ref 3.5–5.0)
Anion gap: 12 (ref 5–15)
BUN: 38 mg/dL — ABNORMAL HIGH (ref 6–20)
CO2: 23 mmol/L (ref 22–32)
Calcium: 7.2 mg/dL — ABNORMAL LOW (ref 8.9–10.3)
Chloride: 95 mmol/L — ABNORMAL LOW (ref 98–111)
Creatinine, Ser: 6.57 mg/dL — ABNORMAL HIGH (ref 0.61–1.24)
GFR, Estimated: 9 mL/min — ABNORMAL LOW (ref >60)
Glucose, Bld: 107 mg/dL — ABNORMAL HIGH (ref 70–99)
Phosphorus: 5.2 mg/dL — ABNORMAL HIGH (ref 2.5–4.6)
Potassium: 4.2 mmol/L (ref 3.5–5.1)
Sodium: 130 mmol/L — ABNORMAL LOW (ref 135–145)

## 2020-10-02 LAB — CBC
HCT: 31.5 % — ABNORMAL LOW (ref 39.0–52.0)
Hemoglobin: 10.9 g/dL — ABNORMAL LOW (ref 13.0–17.0)
MCH: 31.3 pg (ref 26.0–34.0)
MCHC: 34.6 g/dL (ref 30.0–36.0)
MCV: 90.5 fL (ref 80.0–100.0)
Platelets: 146 10*3/uL — ABNORMAL LOW (ref 150–400)
RBC: 3.48 MIL/uL — ABNORMAL LOW (ref 4.22–5.81)
RDW: 13 % (ref 11.5–15.5)
WBC: 9 10*3/uL (ref 4.0–10.5)
nRBC: 0 % (ref 0.0–0.2)

## 2020-10-02 LAB — HEPATIC FUNCTION PANEL
ALT: 373 U/L — ABNORMAL HIGH (ref 0–44)
AST: 776 U/L — ABNORMAL HIGH (ref 15–41)
Albumin: 2.2 g/dL — ABNORMAL LOW (ref 3.5–5.0)
Alkaline Phosphatase: 44 U/L (ref 38–126)
Bilirubin, Direct: 0.1 mg/dL (ref 0.0–0.2)
Indirect Bilirubin: 0.6 mg/dL (ref 0.3–0.9)
Total Bilirubin: 0.7 mg/dL (ref 0.3–1.2)
Total Protein: 5.4 g/dL — ABNORMAL LOW (ref 6.5–8.1)

## 2020-10-02 LAB — CK: Total CK: 31903 U/L — ABNORMAL HIGH (ref 49–397)

## 2020-10-02 LAB — CALCITRIOL (1,25 DI-OH VIT D): Vit D, 1,25-Dihydroxy: 8.7 pg/mL — ABNORMAL LOW (ref 24.8–81.5)

## 2020-10-02 MED ORDER — ENOXAPARIN SODIUM 40 MG/0.4ML IJ SOSY: 40.0000 mg | PREFILLED_SYRINGE | INTRAMUSCULAR | Status: DC

## 2020-10-02 MED ORDER — HEPARIN SODIUM (PORCINE) 5000 UNIT/ML IJ SOLN
5000.0000 [IU] | Freq: Three times a day (TID) | INTRAMUSCULAR | Status: DC
  Administered 2020-10-02 – 2020-10-22 (×52): 5000 [IU] via SUBCUTANEOUS
  Filled 2020-10-02 (×52): qty 1

## 2020-10-02 MED ORDER — CHLORHEXIDINE GLUCONATE CLOTH 2 % EX PADS: 6.0000 | MEDICATED_PAD | Freq: Every day | CUTANEOUS | Status: DC

## 2020-10-02 MED ORDER — HEPARIN SODIUM (PORCINE) 1000 UNIT/ML IJ SOLN
INTRAMUSCULAR | Status: AC
  Filled 2020-10-02: qty 4

## 2020-10-02 MED ORDER — CALCIUM CARBONATE ANTACID 500 MG PO CHEW
400.0000 mg | CHEWABLE_TABLET | Freq: Two times a day (BID) | ORAL | Status: DC
  Administered 2020-10-02 – 2020-10-04 (×6): 400 mg via ORAL
  Filled 2020-10-02 (×6): qty 2

## 2020-10-02 MED ORDER — GABAPENTIN 300 MG PO CAPS
300.0000 mg | ORAL_CAPSULE | Freq: Every day | ORAL | Status: DC
  Administered 2020-10-02 – 2020-10-07 (×6): 300 mg via ORAL
  Filled 2020-10-02 (×6): qty 1

## 2020-10-02 NOTE — Evaluation (Signed)
Occupational Therapy Evaluation Patient Details Name: Brett Butler MRN: [id number] DOB: 01/21/1966 Today's Date: 10/02/2020    History of Present Illness Pt is a 55 y.o. male who presented 09/28/20 with lethargy s/p fall after cocaine use. pt found to have ARF and L lower extremity myositis with rhabdomyolosis and possible sciatic nerve damage. PMH: polysubstance abuse   Clinical Impression   Prior to admission, pt was living with his mother/sister in a 1-level house with 1 STE. Pt was independent with ADLs/ADL mobility/most IADLs without mobility device. Pt does not drive. Pt's sister takes care of their mother and can provide transportation as needed.  Today, pt received semi-reclined in bed, pt agreeable to OT eval. Pt presents with lower back pain, LLE (especially foot) pain, weakness, low activity tolerance, fatigue, and imbalance. Pt required min-mod assist for bed mobility (mostly LLE support), supervision-mod I sitting EOB, min assist for sit>stand from EOB using RW, min assist-in guard for taking 5-10 steps to chair from EOB using RW (cues for safely sequencing), setup-mod I for UB self-care, and mod assist-max assist for LB self-care. Educated pt on PLB, activity pacing, purpose of posey boot, RW safety management, adaptive ADL strategies, and post-acute OT recs. Pt's family can provide up to 24/7 S/A if needed upon d/c. Pt is motivated and gives his best effort. OT will continue to follow pt acutely.      Follow Up Recommendations  CIR;Supervision/Assistance - 24 hour    Equipment Recommendations  Other (comment) (defer to next venue of care)    Recommendations for Other Services Other (comment) (None)     Precautions / Restrictions Precautions Precautions: Fall Precaution Comments: back precautions to manage pain Restrictions Weight Bearing Restrictions: No      Mobility Bed Mobility Overal bed mobility: Needs Assistance Bed Mobility: Supine to Sit;Sidelying to  Sit Rolling: Min guard Sidelying to sit: Min assist;HOB elevated       General bed mobility comments: min-mod assist for LLE support during bed mobility    Transfers Overall transfer level: Needs assistance Equipment used: Rolling walker (2 wheeled) Transfers: Sit to/from Stand Sit to Stand: Min guard;Min assist;From elevated surface         General transfer comment: min assist for sit>stand from EOB using RW, in guard-min assist for 5-10 steps to chair with RW support, cues for sequencing    Balance Overall balance assessment: Needs assistance Sitting-balance support: Bilateral upper extremity supported;Feet supported Sitting balance-Leahy Scale: Good Sitting balance - Comments: no leaning noted   Standing balance support: Bilateral upper extremity supported;During functional activity Standing balance-Leahy Scale: Fair Standing balance comment: Reliant on UE support and physical asisstance to stand.         ADL either performed or assessed with clinical judgement   ADL Overall ADL's : Needs assistance/impaired Eating/Feeding: Independent;Sitting   Grooming: Wash/dry hands;Wash/dry face;Oral care;Set up;Modified independent;Sitting   Upper Body Bathing: Set up;Modified independent;Sitting (simulated EOB)   Lower Body Bathing: Maximal assistance;Sitting/lateral leans;Sit to/from stand;Moderate assistance Lower Body Bathing Details (indicate cue type and reason): simulated sitting EOB Upper Body Dressing : Modified independent;Set up;Sitting (simulated gown management sitting EOB)   Lower Body Dressing: Maximal assistance;Moderate assistance;Sitting/lateral leans;Sit to/from stand (simulated sitting and standing with RW support)   Toilet Transfer: Min guard;Minimal assistance;Cueing for safety;Cueing for sequencing;Ambulation;RW Toilet Transfer Details (indicate cue type and reason): min assist for sit>stand from EOB using RW, in guard-min for 5-10 steps to chair using  RW (cues for sequencing and safety) Toileting- Clothing Manipulation and Hygiene:  Moderate assistance;Minimal assistance;Sitting/lateral lean;Sit to/from stand   Tub/ Engineer, structural:  (N/A)   Functional mobility during ADLs: Min guard;Minimal assistance;Rolling walker General ADL Comments: no LOB but difficulty bearing weight through LLE     Vision Baseline Vision/History: No visual deficits Patient Visual Report: No change from baseline Vision Assessment?: No apparent visual deficits     Perception Perception Perception Tested?: No   Praxis Praxis Praxis tested?: Not tested    Pertinent Vitals/Pain Pain Assessment: No/denies pain Pain Score: 5  Pain Location: low back Pain Descriptors / Indicators: Discomfort;Grimacing;Guarding Pain Intervention(s): Monitored during session;Repositioned     Hand Dominance Right   Extremity/Trunk Assessment Upper Extremity Assessment Upper Extremity Assessment: Overall WFL for tasks assessed   Lower Extremity Assessment Lower Extremity Assessment: Defer to PT evaluation   Cervical / Trunk Assessment Cervical / Trunk Assessment: Normal   Communication Communication Communication: No difficulties   Cognition Arousal/Alertness: Awake/alert Behavior During Therapy: WFL for tasks assessed/performed Overall Cognitive Status: Within Functional Limits for tasks assessed     General Comments: A&Ox4.   General Comments  increased edema to entire LLE, posey boot for L foot, wound to forehead            Home Living Family/patient expects to be discharged to:: Private residence Living Arrangements: Parent;Other (Comment) (mother, sister, and brother in law) Available Help at Discharge: Family;Available PRN/intermittently Type of Home: House Home Access: Stairs to enter [employer] of Steps: 1 Entrance Stairs-Rails: None Home Layout: One level     Bathroom Shower/Tub: Chief Strategy Officer:  Standard Bathroom Accessibility: Yes How Accessible: Accessible via walker Home Equipment: Bedside commode;Cane - single point   Additional Comments: sister takes care of pt's mother typically      Prior Functioning/Environment Level of Independence: Independent        Comments: Pt does not drive. His sister can drive him to appointments. Pt does not work, applying for disability. Independent with ADLs/ADLmobility/most IADLs without AD.        OT Problem List: Decreased strength;Decreased range of motion;Decreased activity tolerance;Impaired balance (sitting and/or standing);Decreased knowledge of use of DME or AE;Pain;Increased edema      OT Treatment/Interventions: Self-care/ADL training;Therapeutic exercise;Neuromuscular education;DME and/or AE instruction;Therapeutic activities;Patient/family education    OT Goals(Current goals can be found in the care plan section) Acute Rehab OT Goals Patient Stated Goal: return home and remain independent with ADLs OT Goal Formulation: With patient Time For Goal Achievement: 10/16/20 Potential to Achieve Goals: Good  OT Frequency: Min 2X/week    AM-PAC OT "6 Clicks" Daily Activity     Outcome Measure Help from another person eating meals?: None Help from another person taking care of personal grooming?: A Little Help from another person toileting, which includes using toliet, bedpan, or urinal?: A Lot Help from another person bathing (including washing, rinsing, drying)?: A Lot Help from another person to put on and taking off regular upper body clothing?: A Little Help from another person to put on and taking off regular lower body clothing?: A Lot 6 Click Score: 16   End of Session Equipment Utilized During Treatment: Rolling walker Nurse Communication: Mobility status  Activity Tolerance: Patient limited by fatigue;Patient limited by pain Patient left: in chair;with call bell/phone within reach  OT Visit Diagnosis: Unsteadiness  on feet (R26.81);Muscle weakness (generalized) (M62.81);Pain Pain - Right/Left: Left Pain - part of body: Leg                Time: 0210-0251 OT  Time Calculation (min): 41 min Charges:  OT General Charges $OT Visit: 1 Visit OT Evaluation $OT Eval Moderate Complexity: 1 Mod OT Treatments $Self Care/Home Management : 8-22 mins  Norris Cross, OTR/L [facility] [phone]  Mechele Claude 10/02/2020, 3:10 PM

## 2020-10-02 NOTE — Progress Notes (Signed)
[facility]PH[facility]OLOG[facility] PROGRESS NOTE  Assessment/ Plan:  # Acute kidney injury: This is secondary to pigment nephropathy/rhabdomyolysis.  Anuric and with dense renal injury.  Started hemodialysis for management of azotemia/myoglobin clearance as efforts at diuresis with volume expansion/furosemide were unsuccessful.  He is status post right IJ temporary dialysis catheter placement by IR.  The first attempt for HD on 8/19 was unsuccessful because of catheter issue. He actually had first dialysis yesterday on 8/19 with around 2.5 L ultrafiltration, reportedly no issue with the catheter.  Remains oliguric with no sign of renal recovery therefore plan for second dialysis today. Daily lab, strict ins and out and watch for renal recovery.  # Hyperkalemia: Secondary to rhabdomyolysis and associated acute kidney injury.  Corrected with medical management and HD.  #  Rhabdomyolysis, nontraumatic: Secondary to prolonged dependent posturing of the left lower extremity following drug use.  Starting dialysis as he developed AKI.  # Polysubstance abuse: Educated about the need for cessation/abstinence.   #Hypocalcemia: Repleted with calcium and vitamin D.  Monitor lab.  Subjective: Seen and examined at bedside.  He had first dialysis yesterday when there was no problem with the catheter.  Seen by IR team as well.  He reports feeling good without any complaint.  Only able to make 100 cc of urine per chart.  Objective Vital signs in last 24 hours: Vitals:   10/02/20 0440 10/02/20 0510 10/02/20 0550 10/02/20 0709  BP: 125/89 119/84 (!) 149/89 (!) 148/89  Pulse:   77 70  Resp: 17 16 16 16   Temp:  98.2 F (36.8 C)  98.6 F (37 C)  TempSrc:  Oral  Oral  SpO2:  98% 94% 93%  Weight:       Weight change:   Intake/Output Summary (Last 24 hours) at 10/02/2020 0956 Last data filed at 10/02/2020 0559 Gross per 24 hour  Intake 347 ml  Output 2600 ml  Net -2253 ml        Labs: Basic  Metabolic Panel: Recent Labs  Lab 09/30/20 0220 10/01/20 0432 10/02/20 0703  NA 131* 130* 130*  K 4.9 4.5 4.2  CL 96* 95* 95*  CO2 23 22 23   GLUCOSE 108* 105* 107*  BUN 63* 71* 38*  CREATININE 7.75* 9.39* 6.57*  CALCIUM 6.2* 5.9* 7.2*  PHOS  --  7.6* 5.2*    Liver Function Tests: Recent Labs  Lab 09/30/20 0220 10/01/20 0432 10/02/20 0703  AST 954* 808* 776*  ALT 369* 345* 373*  ALKPHOS 42 36* 44  BILITOT 0.8 1.0 0.7  PROT 4.8* 4.5* 5.4*  ALBUMIN 2.1* 2.0*  2.0* 2.2*  2.3*    No results for input(s): LIPASE, AMYLASE in the last 168 hours. No results for input(s): AMMONIA in the last 168 hours. CBC: Recent Labs  Lab 09/28/20 1617 09/29/20 0406 09/30/20 0220 10/02/20 0703  WBC 27.3* 24.0* 14.8* 9.0  NEUTROABS 23.6* 20.9*  --   --   HGB 18.3* 14.1 11.1* 10.9*  HCT 57.4* 40.8 30.9* 31.5*  MCV 99.1 92.7 89.0 90.5  PLT 316 213 181 146*    Cardiac Enzymes: Recent Labs  Lab 09/28/20 1800 09/30/20 0220 10/01/20 0432 10/02/20 0703  CKTOTAL >50,000* >50,000* >50,000* 31,903*    CBG: No results for input(s): GLUCAP in the last 168 hours.  Iron Studies: No results for input(s): IRON, TIBC, TRANSFERRIN, FERRITIN in the last 72 hours. Studies/Results: IR Fluoro Guide CV Line Right  Result Date: 09/30/2020 INDICATION: 55 year old male referred for temporary hemodialysis  catheter EXAM: IMAGE GUIDED TEMPORARY HEMODIALYSIS CATHETER MEDICATIONS: None ANESTHESIA/SEDATION: None FLUOROSCOPY TIME:  Fluoroscopy Time: 0 minutes 6 seconds (0 mGy). COMPLICATIONS: None PROCEDURE: Informed written consent was obtained from the patient after a thorough discussion of the procedural risks, benefits and alternatives. All questions were addressed. Maximal Sterile Barrier Technique was utilized including caps, mask, sterile gowns, sterile gloves, sterile drape, hand hygiene and skin antiseptic. A timeout was performed prior to the initiation of the procedure. The right neck and chest  was prepped with chlorhexidine, and draped in the usual sterile fashion using maximum barrier technique (cap and mask, sterile gown, sterile gloves, large sterile sheet, hand hygiene and cutaneous antiseptic). Local anesthesia was attained by infiltration with 1% lidocaine without epinephrine. Ultrasound demonstrated patency of the right internal jugular vein, and this was documented with an image. Under real-time ultrasound guidance, this vein was accessed with a 21 gauge micropuncture needle and image documentation was performed. A small dermatotomy was made at the access site with an 11 scalpel. A 0.018" wire was advanced into the SVC and the access needle exchanged for a 26F micropuncture vascular sheath. 035 wire was advanced into the IVC. A 15 cm catheter was selected. Skin and subcutaneous tissues were serially dilated. Catheter was placed on the wire. The catheter tip is positioned in the upper right atrium. This was documented with a spot image. Both ports of the hemodialysis catheter were then tested for excellent function. The ports were then locked with heparinized lock. Patient tolerated the procedure well and remained hemodynamically stable throughout. No complications were encountered and no significant blood loss was encountered. IMPRESSION: Status post right IJ temp HD catheter placement. Signed, Yvone Neu. Reyne Dumas, RPVI [facility] Radiology Electronically Signed   By: Gilmer Mor D.O.   On: 09/30/2020 11:13   IR US Guide Vasc Access Right  Result Date: 09/30/2020 INDICATION: 55 year old male referred for temporary hemodialysis catheter EXAM: IMAGE GUIDED TEMPORARY HEMODIALYSIS CATHETER MEDICATIONS: None ANESTHESIA/SEDATION: None FLUOROSCOPY TIME:  Fluoroscopy Time: 0 minutes 6 seconds (0 mGy). COMPLICATIONS: None PROCEDURE: Informed written consent was obtained from the patient after a thorough discussion of the procedural risks, benefits and  alternatives. All questions were addressed. Maximal Sterile Barrier Technique was utilized including caps, mask, sterile gowns, sterile gloves, sterile drape, hand hygiene and skin antiseptic. A timeout was performed prior to the initiation of the procedure. The right neck and chest was prepped with chlorhexidine, and draped in the usual sterile fashion using maximum barrier technique (cap and mask, sterile gown, sterile gloves, large sterile sheet, hand hygiene and cutaneous antiseptic). Local anesthesia was attained by infiltration with 1% lidocaine without epinephrine. Ultrasound demonstrated patency of the right internal jugular vein, and this was documented with an image. Under real-time ultrasound guidance, this vein was accessed with a 21 gauge micropuncture needle and image documentation was performed. A small dermatotomy was made at the access site with an 11 scalpel. A 0.018" wire was advanced into the SVC and the access needle exchanged for a 26F micropuncture vascular sheath. 035 wire was advanced into the IVC. A 15 cm catheter was selected. Skin and subcutaneous tissues were serially dilated. Catheter was placed on the wire. The catheter tip is positioned in the upper right atrium. This was documented with a spot image. Both ports of the hemodialysis catheter were then tested for excellent function. The ports were then locked with heparinized lock. Patient tolerated the procedure well and remained hemodynamically stable throughout. No complications  were encountered and no significant blood loss was encountered. IMPRESSION: Status post right IJ temp HD catheter placement. Signed, Yvone Neu. Reyne Dumas, RPVI [facility] Radiology Electronically Signed   By: Gilmer Mor D.O.   On: 09/30/2020 11:13    Medications: Infusions:    Scheduled Medications:  calcium carbonate  1 tablet Oral TID   Chlorhexidine Gluconate Cloth  6 each Topical Q0600   heparin  sodium (porcine)       Vitamin D (Ergocalciferol)  50,000 Units Oral Q7 days    have reviewed scheduled and prn medications.  Physical Exam: General:NAD, able to lie flat, looks comfortable Heart:RRR, s1s2 nl Lungs:clear b/l, no crackle Abdomen:soft, Non-tender, non-distended Extremities: Lower extremity pitting edema present++ Dialysis Access: Right IJ temporary HD catheter placed by IR on 8/18  Benedicto Capozzi Prasad Analeigh Aries 10/02/2020,9:56 AM  LOS: 4 days

## 2020-10-02 NOTE — Progress Notes (Signed)
PROGRESS NOTE    Brett Butler  ZMO:[id number] DOB: 1965/10/26 DOA: 09/28/2020 PCP: Lavinia Sharps, NP   Brief Narrative: Brett Butler is a 55 y.o. male with a history of polysubstance use including opiates, cocaine, cannabis in addition to schizoaffective disorder.  Patient presented secondary to being found to be lethargic by his friend.  Patient responded to Narcan when EMS arrived.  On arrival to the hospital, patient was noted to have a severe AKI and evidence of severe rhabdomyolysis.   Assessment & Plan:   Principal Problem:   ARF (acute renal failure) (HCC) Active Problems:   Rhabdomyolysis   Cocaine abuse (HCC)   Hyperkalemia   Leukocytosis   Injury of left sciatic nerve   AKI Likely pigment-induced injury from rhabdomyolysis.  Baseline creatinine of about 1.  Creatinine of 4.95 on admission with continued increase.  Patient started on IV fluids at 150 ml/hr with increase to 200 ml/hr by nephrology with no improvement in creatinine or urine output. Urinalysis with significant hemoglobin/no RBCs. Temporary HD catheter placed on 8/18 and HD initiated the same day, although he was unable to complete secondary to malfunctioning line -Nephrology recommendations: Continued HD, pending today -Strict ins/out, watch urine output very carefully  Rhabdomyolysis Secondary to to prolonged time down and resultant trauma.  Advanced imaging with evidence consistent with rhabdomyolysis.  With significant amount of edema noted on advanced imaging, orthopedic surgery was consulted with recommendations for no surgical management. CK trended down today. AST/ALT stabilized today. -Fluids as mentioned above -Daily CK, hepatic function testing  Left leg weakness Sciatic neuropathy Neuropathic pain In setting of prolonged time down and likely sciatic nerve damage. Concern there may not be full/any recovery, but patient will need physical therapy for best chance. PT/OT recommending  CIR. -Gabapentin 300 mg QHS  Hyperkalemia Secondary to AKI.  Peak potassium 6.7.  Associated peaked T waves.  Treated with Lokelma. Now resolved.  Hypocalcemia Correct calcium of about 7.5 today in setting of hypoalbuminemia. PTH elevated with low 25-vitamin D. Improved with calcium and vitamin D replacement -Calcium carbonate 2 tablet BID -Vitamin D 50,000 units weekly  Demand ischemia Elevated troponin in setting of rhabdomyolysis.  Peak troponin of 140 with delta troponin of 120.  No chest pain.  Cocaine abuse Patient states that he snorts cocaine.  He thinks that the cocaine may have been laced with fentanyl. Social work consulted.  Leukocytosis Likely reactive secondary to rhabdomyolysis.  Patient given Vancomycin, Flagyl and cefepime in the ED which have been discontinued. Blood cultures obtained on admission and are no growth to date.  Resolved.   DVT prophylaxis: SCDs Code Status:   Code Status: Full Code Family Communication: None at bedside Disposition Plan: Discharge likely to CIR in several days pending improvement of rhabdomyolysis, AKI/nephrology recommendations   Consultants:  Orthopedic surgery Neurology  Procedures:  None  Antimicrobials: Vancomycin IV Cefepime IV Flagyl IV    Subjective: No issues overnight. Some improvement in left leg strength  Objective: Vitals:   10/02/20 0440 10/02/20 0510 10/02/20 0550 10/02/20 0709  BP: 125/89 119/84 (!) 149/89 (!) 148/89  Pulse:   77 70  Resp: 17 16 16 16   Temp:  98.2 F (36.8 C)  98.6 F (37 C)  TempSrc:  Oral  Oral  SpO2:  98% 94% 93%  Weight:        Intake/Output Summary (Last 24 hours) at 10/02/2020 1015 Last data filed at 10/02/2020 0559 Gross per 24 hour  Intake 347 ml  Output  2600 ml  Net -2253 ml    Filed Weights   09/30/20 1925 09/30/20 2120 10/01/20 0319  Weight: 75.4 kg 75.4 kg 78.8 kg    Examination:  General exam: Appears calm and comfortable and in no acute distress.  Conversant Respiratory: Clear to auscultation. Respiratory effort normal with no intercostal retractions or use of accessory muscles Cardiovascular: S1 & S2 heard, RRR. No murmurs, rubs, gallops or clicks. LLE 2+ edema up thi left buttock Gastrointestinal: Abdomen is nondistended, soft and nontender. No masses felt. Normal bowel sounds heard Neurologic: Left LE with 1/5 strength Musculoskeletal: No calf tenderness Skin: No cyanosis. No new rashes Psychiatry: Alert and oriented. Memory intact. Mood & affect appropriate   Data Reviewed: I have personally reviewed following labs and imaging studies  CBC Lab Results  Component Value Date   WBC 9.0 10/02/2020   RBC 3.48 (L) 10/02/2020   HGB 10.9 (L) 10/02/2020   HCT 31.5 (L) 10/02/2020   MCV 90.5 10/02/2020   MCH 31.3 10/02/2020   PLT 146 (L) 10/02/2020   MCHC 34.6 10/02/2020   RDW 13.0 10/02/2020   LYMPHSABS 1.4 09/29/2020   MONOABS 1.5 (H) 09/29/2020   EOSABS 0.0 09/29/2020   BASOSABS 0.0 09/29/2020     Last metabolic panel Lab Results  Component Value Date   NA 130 (L) 10/02/2020   K 4.2 10/02/2020   CL 95 (L) 10/02/2020   CO2 23 10/02/2020   BUN 38 (H) 10/02/2020   CREATININE 6.57 (H) 10/02/2020   GLUCOSE 107 (H) 10/02/2020   GFRNONAA 9 (L) 10/02/2020   GFRAA >60 07/30/2019   CALCIUM 7.2 (L) 10/02/2020   PHOS 5.2 (H) 10/02/2020   PROT 5.4 (L) 10/02/2020   ALBUMIN 2.2 (L) 10/02/2020   ALBUMIN 2.3 (L) 10/02/2020   BILITOT 0.7 10/02/2020   ALKPHOS 44 10/02/2020   AST 776 (H) 10/02/2020   ALT 373 (H) 10/02/2020   ANIONGAP 12 10/02/2020    CBG (last 3)  No results for input(s): GLUCAP in the last 72 hours.   GFR: Estimated Creatinine Clearance: 12.1 mL/min (A) (by C-G formula based on SCr of 6.57 mg/dL (H)).  Coagulation Profile: No results for input(s): INR, PROTIME in the last 168 hours.  Recent Results (from the past 240 hour(s))  Culture, blood (Routine X 2) w Reflex to ID Panel     Status: None  (Preliminary result)   Collection Time: 09/28/20  7:27 PM   Specimen: BLOOD  Result Value Ref Range Status   Specimen Description   Final    BLOOD RIGHT ANTECUBITAL Performed at [facility], [address] [place], [place] [address]    Special Requests   Final    Blood Culture results may not be optimal due to an inadequate volume of blood received in culture bottles BOTTLES DRAWN AEROBIC AND ANAEROBIC Performed at [facility], [address] [place], [place] [address]    Culture   Final    NO GROWTH 4 DAYS Performed at [facility], [address], [place], [place] [address]    Report Status PENDING  Incomplete  Culture, blood (Routine X 2) w Reflex to ID Panel     Status: None (Preliminary result)   Collection Time: 09/28/20  7:32 PM   Specimen: BLOOD  Result Value Ref Range Status   Specimen Description   Final    BLOOD BLOOD RIGHT ARM Performed at [facility], [address] [place], [place] [address]  Special Requests   Final    Blood Culture results may not be optimal due to an inadequate volume of blood received in culture bottles BOTTLES DRAWN AEROBIC AND ANAEROBIC Performed at [facility], [address], [place], [place] [address]    Culture   Final    NO GROWTH 4 DAYS Performed at [facility], [address], [place], [place] [address]    Report Status PENDING  Incomplete  Resp Panel by RT-PCR (Flu A&B, Covid) Nasopharyngeal Swab     Status: None   Collection Time: 09/28/20  8:00 PM   Specimen: Nasopharyngeal Swab; Nasopharyngeal(NP) swabs in vial transport medium  Result Value Ref Range Status   SARS Coronavirus 2 by RT PCR NEGATIVE NEGATIVE Final    Comment: (NOTE) SARS-CoV-2 target nucleic acids are NOT DETECTED.  The SARS-CoV-2 RNA is generally detectable in upper respiratory specimens during the acute phase of infection. The lowest concentration of SARS-CoV-2  viral copies this assay can detect is 138 copies/mL. A negative result does not preclude SARS-Cov-2 infection and should not be used as the sole basis for treatment or other patient management decisions. A negative result may occur with  improper specimen collection/handling, submission of specimen other than nasopharyngeal swab, presence of viral mutation(s) within the areas targeted by this assay, and inadequate number of viral copies(<138 copies/mL). A negative result must be combined with clinical observations, patient history, and epidemiological information. The expected result is Negative.  Fact Sheet for Patients:  BloggerCourse.com  Fact Sheet for Healthcare Providers:  SeriousBroker.it  This test is no t yet approved or cleared by the [place] FDA and  has been authorized for detection and/or diagnosis of SARS-CoV-2 by FDA under an Emergency Use Authorization (EUA). This EUA will remain  in effect (meaning this test can be used) for the duration of the COVID-19 declaration under Section 564(b)(1) of the Act, 21 U.S.C.section 360bbb-3(b)(1), unless the authorization is terminated  or revoked sooner.       Influenza A by PCR NEGATIVE NEGATIVE Final   Influenza B by PCR NEGATIVE NEGATIVE Final    Comment: (NOTE) The Xpert Xpress SARS-CoV-2/FLU/RSV plus assay is intended as an aid in the diagnosis of influenza from Nasopharyngeal swab specimens and should not be used as a sole basis for treatment. Nasal washings and aspirates are unacceptable for Xpert Xpress SARS-CoV-2/FLU/RSV testing.  Fact Sheet for Patients: BloggerCourse.com  Fact Sheet for Healthcare Providers: SeriousBroker.it  This test is not yet approved or cleared by the [place] FDA and has been authorized for detection and/or diagnosis of SARS-CoV-2 by FDA under an Emergency Use Authorization  (EUA). This EUA will remain in effect (meaning this test can be used) for the duration of the COVID-19 declaration under Section 564(b)(1) of the Act, 21 U.S.C. section 360bbb-3(b)(1), unless the authorization is terminated or revoked.  Performed at [facility], [address], [place], [place] [address]         Radiology Studies: IR Fluoro Guide CV Line Right  Result Date: 09/30/2020 INDICATION: 55 year old male referred for temporary hemodialysis catheter EXAM: IMAGE GUIDED TEMPORARY HEMODIALYSIS CATHETER MEDICATIONS: None ANESTHESIA/SEDATION: None FLUOROSCOPY TIME:  Fluoroscopy Time: 0 minutes 6 seconds (0 mGy). COMPLICATIONS: None PROCEDURE: Informed written consent was obtained from the patient after a thorough discussion of the procedural risks, benefits and alternatives. All questions were addressed. Maximal Sterile Barrier Technique was utilized including caps, mask, sterile gowns, sterile gloves, sterile drape, hand hygiene and skin antiseptic.  A timeout was performed prior to the initiation of the procedure. The right neck and chest was prepped with chlorhexidine, and draped in the usual sterile fashion using maximum barrier technique (cap and mask, sterile gown, sterile gloves, large sterile sheet, hand hygiene and cutaneous antiseptic). Local anesthesia was attained by infiltration with 1% lidocaine without epinephrine. Ultrasound demonstrated patency of the right internal jugular vein, and this was documented with an image. Under real-time ultrasound guidance, this vein was accessed with a 21 gauge micropuncture needle and image documentation was performed. A small dermatotomy was made at the access site with an 11 scalpel. A 0.018" wire was advanced into the SVC and the access needle exchanged for a 23F micropuncture vascular sheath. 035 wire was advanced into the IVC. A 15 cm catheter was selected. Skin and subcutaneous tissues were serially dilated. Catheter was  placed on the wire. The catheter tip is positioned in the upper right atrium. This was documented with a spot image. Both ports of the hemodialysis catheter were then tested for excellent function. The ports were then locked with heparinized lock. Patient tolerated the procedure well and remained hemodynamically stable throughout. No complications were encountered and no significant blood loss was encountered. IMPRESSION: Status post right IJ temp HD catheter placement. Signed, Yvone NeuJaime S. Reyne DumasWagner, DO, RPVI [facility] Electronically Signed   By: Gilmer MorJaime  Wagner D.O.   On: 09/30/2020 11:13   IR US Guide Vasc Access Right  Result Date: 09/30/2020 INDICATION: 55 year old male referred for temporary hemodialysis catheter EXAM: IMAGE GUIDED TEMPORARY HEMODIALYSIS CATHETER MEDICATIONS: None ANESTHESIA/SEDATION: None FLUOROSCOPY TIME:  Fluoroscopy Time: 0 minutes 6 seconds (0 mGy). COMPLICATIONS: None PROCEDURE: Informed written consent was obtained from the patient after a thorough discussion of the procedural risks, benefits and alternatives. All questions were addressed. Maximal Sterile Barrier Technique was utilized including caps, mask, sterile gowns, sterile gloves, sterile drape, hand hygiene and skin antiseptic. A timeout was performed prior to the initiation of the procedure. The right neck and chest was prepped with chlorhexidine, and draped in the usual sterile fashion using maximum barrier technique (cap and mask, sterile gown, sterile gloves, large sterile sheet, hand hygiene and cutaneous antiseptic). Local anesthesia was attained by infiltration with 1% lidocaine without epinephrine. Ultrasound demonstrated patency of the right internal jugular vein, and this was documented with an image. Under real-time ultrasound guidance, this vein was accessed with a 21 gauge micropuncture needle and image documentation was performed. A small dermatotomy was made  at the access site with an 11 scalpel. A 0.018" wire was advanced into the SVC and the access needle exchanged for a 23F micropuncture vascular sheath. 035 wire was advanced into the IVC. A 15 cm catheter was selected. Skin and subcutaneous tissues were serially dilated. Catheter was placed on the wire. The catheter tip is positioned in the upper right atrium. This was documented with a spot image. Both ports of the hemodialysis catheter were then tested for excellent function. The ports were then locked with heparinized lock. Patient tolerated the procedure well and remained hemodynamically stable throughout. No complications were encountered and no significant blood loss was encountered. IMPRESSION: Status post right IJ temp HD catheter placement. Signed, Yvone NeuJaime S. Reyne DumasWagner, DO, RPVI [facility] Electronically Signed   By: Gilmer MorJaime  Wagner D.O.   On: 09/30/2020 11:13        Scheduled Meds:  Chlorhexidine Gluconate Cloth  6 each Topical Q0600   gabapentin  300  mg Oral QHS   heparin sodium (porcine)       Vitamin D (Ergocalciferol)  50,000 Units Oral Q7 days   Continuous Infusions:     LOS: 4 days     Jacquelin Hawking, MD [facility] 10/02/2020, 10:15 AM  If 7PM-7AM, please contact night-coverage [email or web address]

## 2020-10-03 LAB — RENAL FUNCTION PANEL
Albumin: 2.1 g/dL — ABNORMAL LOW (ref 3.5–5.0)
Anion gap: 10 (ref 5–15)
BUN: 27 mg/dL — ABNORMAL HIGH (ref 6–20)
CO2: 27 mmol/L (ref 22–32)
Calcium: 7.6 mg/dL — ABNORMAL LOW (ref 8.9–10.3)
Chloride: 96 mmol/L — ABNORMAL LOW (ref 98–111)
Creatinine, Ser: 5.49 mg/dL — ABNORMAL HIGH (ref 0.61–1.24)
GFR, Estimated: 11 mL/min — ABNORMAL LOW (ref >60)
Glucose, Bld: 155 mg/dL — ABNORMAL HIGH (ref 70–99)
Phosphorus: 5.3 mg/dL — ABNORMAL HIGH (ref 2.5–4.6)
Potassium: 4 mmol/L (ref 3.5–5.1)
Sodium: 133 mmol/L — ABNORMAL LOW (ref 135–145)

## 2020-10-03 LAB — CBC
HCT: 32.9 % — ABNORMAL LOW (ref 39.0–52.0)
Hemoglobin: 11.3 g/dL — ABNORMAL LOW (ref 13.0–17.0)
MCH: 31.6 pg (ref 26.0–34.0)
MCHC: 34.3 g/dL (ref 30.0–36.0)
MCV: 91.9 fL (ref 80.0–100.0)
Platelets: 135 10*3/uL — ABNORMAL LOW (ref 150–400)
RBC: 3.58 MIL/uL — ABNORMAL LOW (ref 4.22–5.81)
RDW: 13.1 % (ref 11.5–15.5)
WBC: 9.1 10*3/uL (ref 4.0–10.5)
nRBC: 0 % (ref 0.0–0.2)

## 2020-10-03 LAB — CULTURE, BLOOD (ROUTINE X 2)
Culture: NO GROWTH
Culture: NO GROWTH

## 2020-10-03 LAB — HEPATIC FUNCTION PANEL
ALT: 351 U/L — ABNORMAL HIGH (ref 0–44)
AST: 606 U/L — ABNORMAL HIGH (ref 15–41)
Albumin: 2.2 g/dL — ABNORMAL LOW (ref 3.5–5.0)
Alkaline Phosphatase: 43 U/L (ref 38–126)
Bilirubin, Direct: <0.1 mg/dL (ref 0.0–0.2)
Total Bilirubin: 0.7 mg/dL (ref 0.3–1.2)
Total Protein: 5.3 g/dL — ABNORMAL LOW (ref 6.5–8.1)

## 2020-10-03 LAB — CK: Total CK: >50000 U/L — ABNORMAL HIGH (ref 49–397)

## 2020-10-03 MED ORDER — CHLORHEXIDINE GLUCONATE CLOTH 2 % EX PADS: 6.0000 | MEDICATED_PAD | Freq: Every day | CUTANEOUS | Status: DC

## 2020-10-03 NOTE — Progress Notes (Signed)
PROGRESS NOTE    Brett Butler  GGE:[id number] DOB: [birth year] DOA: 09/28/2020 PCP: Lavinia Sharps, NP   Brief Narrative: Brett Butler is a 55 y.o. male with a history of polysubstance use including opiates, cocaine, cannabis in addition to schizoaffective disorder.  Patient presented secondary to being found to be lethargic by his friend.  Patient responded to Narcan when EMS arrived.  On arrival to the hospital, patient was noted to have a severe AKI and evidence of severe rhabdomyolysis.   Assessment & Plan:   Principal Problem:   ARF (acute renal failure) (HCC) Active Problems:   Rhabdomyolysis   Cocaine abuse (HCC)   Hyperkalemia   Leukocytosis   Injury of left sciatic nerve   AKI Likely pigment-induced injury from rhabdomyolysis.  Baseline creatinine of about 1.  Creatinine of 4.95 on admission with continued increase.  Patient started on IV fluids at 150 ml/hr with increase to 200 ml/hr by nephrology with no improvement in creatinine or urine output. Urinalysis with significant hemoglobin/no RBCs. Temporary HD catheter placed on 8/18 and HD initiated the same day, although he was unable to complete secondary to malfunctioning line -Nephrology recommendations: Continued HD -Strict ins/out, watch urine output very carefully  Rhabdomyolysis Secondary to to prolonged time down and resultant trauma.  Advanced imaging with evidence consistent with rhabdomyolysis.  With significant amount of edema noted on advanced imaging, orthopedic surgery was consulted with recommendations for no surgical management. CK with intial downtrend, but now back up; no worsening symptoms. AST/ALT trending down. -Fluids as mentioned above -Daily CK, hepatic function testing  Left leg weakness Sciatic neuropathy Neuropathic pain In setting of prolonged time down and likely sciatic nerve damage. Concern there may not be full/any recovery, but patient will need physical therapy for best chance.  PT/OT recommending CIR. -Gabapentin 300 mg QHS  Hyperkalemia Secondary to AKI.  Peak potassium 6.7.  Associated peaked T waves.  Treated with Lokelma. Now resolved.  Hypocalcemia Correct calcium of about 7.5 today in setting of hypoalbuminemia. PTH elevated with low 1,25/25-vitamin D. Improved with calcium and vitamin D replacement. -Calcium carbonate 2 tablet BID -Vitamin D 50,000 units weekly  Demand ischemia Elevated troponin in setting of rhabdomyolysis.  Peak troponin of 140 with delta troponin of 120.  No chest pain.  Cocaine abuse Patient states that he snorts cocaine.  He thinks that the cocaine may have been laced with fentanyl. Social work consulted.  Leukocytosis Likely reactive secondary to rhabdomyolysis.  Patient given Vancomycin, Flagyl and cefepime in the ED which have been discontinued. Blood cultures obtained on admission and are no growth to date.  Resolved.   DVT prophylaxis: SCDs Code Status:   Code Status: Full Code Family Communication: None at bedside Disposition Plan: Discharge likely to CIR in several days pending improvement of rhabdomyolysis, AKI/nephrology recommendations   Consultants:  Orthopedic surgery Neurology  Procedures:  None  Antimicrobials: Vancomycin IV Cefepime IV Flagyl IV    Subjective: Patient is optimistic about his return of movement in his left leg  Objective: Vitals:   10/03/20 0313 10/03/20 0500 10/03/20 0700 10/03/20 1135  BP: (!) 142/87  (!) 158/101 (!) 141/88  Pulse: 76  77 73  Resp: 10  14 15   Temp: 98.4 F (36.9 C)  (!) 97.5 F (36.4 C) 98.4 F (36.9 C)  TempSrc: Oral  Oral Oral  SpO2: 90%  92% 93%  Weight:  74.1 kg    Height:        Intake/Output Summary (Last 24  hours) at 10/03/2020 1303 Last data filed at 10/03/2020 0945 Gross per 24 hour  Intake 420 ml  Output 2050 ml  Net -1630 ml    Filed Weights   10/02/20 1124 10/02/20 1908 10/03/20 0500  Weight: 73.2 kg 74.8 kg 74.1 kg     Examination:  General exam: Appears calm and comfortable Respiratory system: Clear to auscultation. Respiratory effort normal. Cardiovascular system: S1 & S2 heard, RRR. No murmurs, rubs, gallops or clicks. Gastrointestinal system: Abdomen is nondistended, soft and nontender. No organomegaly or masses felt. Normal bowel sounds heard. Central nervous system: Alert and oriented. LLE with 2/5 strength but he is able to wiggle his toes today Musculoskeletal: No edema. No calf tenderness Skin: No cyanosis. No rashes Psychiatry: Judgement and insight appear normal. Mood & affect appropriate.    Data Reviewed: I have personally reviewed following labs and imaging studies  CBC Lab Results  Component Value Date   WBC 9.1 10/03/2020   RBC 3.58 (L) 10/03/2020   HGB 11.3 (L) 10/03/2020   HCT 32.9 (L) 10/03/2020   MCV 91.9 10/03/2020   MCH 31.6 10/03/2020   PLT 135 (L) 10/03/2020   MCHC 34.3 10/03/2020   RDW 13.1 10/03/2020   LYMPHSABS 1.4 09/29/2020   MONOABS 1.5 (H) 09/29/2020   EOSABS 0.0 09/29/2020   BASOSABS 0.0 09/29/2020     Last metabolic panel Lab Results  Component Value Date   NA 133 (L) 10/03/2020   K 4.0 10/03/2020   CL 96 (L) 10/03/2020   CO2 27 10/03/2020   BUN 27 (H) 10/03/2020   CREATININE 5.49 (H) 10/03/2020   GLUCOSE 155 (H) 10/03/2020   GFRNONAA 11 (L) 10/03/2020   GFRAA >60 07/30/2019   CALCIUM 7.6 (L) 10/03/2020   PHOS 5.3 (H) 10/03/2020   PROT 5.3 (L) 10/03/2020   ALBUMIN 2.2 (L) 10/03/2020   ALBUMIN 2.1 (L) 10/03/2020   BILITOT 0.7 10/03/2020   ALKPHOS 43 10/03/2020   AST 606 (H) 10/03/2020   ALT 351 (H) 10/03/2020   ANIONGAP 10 10/03/2020    CBG (last 3)  No results for input(s): GLUCAP in the last 72 hours.   GFR: Estimated Creatinine Clearance: 14.5 mL/min (A) (by C-G formula based on SCr of 5.49 mg/dL (H)).  Coagulation Profile: No results for input(s): INR, PROTIME in the last 168 hours.  Recent Results (from the past 240  hour(s))  Culture, blood (Routine X 2) w Reflex to ID Panel     Status: None   Collection Time: 09/28/20  7:27 PM   Specimen: BLOOD  Result Value Ref Range Status   Specimen Description   Final    BLOOD RIGHT ANTECUBITAL Performed at [facility], [address], [place], [place] [address]    Special Requests   Final    Blood Culture results may not be optimal due to an inadequate volume of blood received in culture bottles BOTTLES DRAWN AEROBIC AND ANAEROBIC Performed at [facility], [address] [place], [place] [address]    Culture   Final    NO GROWTH 5 DAYS Performed at [facility], [address], [place], [place] [address]    Report Status 10/03/2020 FINAL  Final  Culture, blood (Routine X 2) w Reflex to ID Panel     Status: None   Collection Time: 09/28/20  7:32 PM   Specimen: BLOOD  Result Value Ref Range Status   Specimen Description   Final    BLOOD BLOOD  RIGHT ARM Performed at [facility], [address] [place], [place] [address]    Special Requests   Final    Blood Culture results may not be optimal due to an inadequate volume of blood received in culture bottles BOTTLES DRAWN AEROBIC AND ANAEROBIC Performed at [facility], [address] [place], [place] [address]    Culture   Final    NO GROWTH 5 DAYS Performed at [facility], [address] [place], [place] [address]    Report Status 10/03/2020 FINAL  Final  Resp Panel by RT-PCR (Flu A&B, Covid) Nasopharyngeal Swab     Status: None   Collection Time: 09/28/20  8:00 PM   Specimen: Nasopharyngeal Swab; Nasopharyngeal(NP) swabs in vial transport medium  Result Value Ref Range Status   SARS Coronavirus 2 by RT PCR NEGATIVE NEGATIVE Final    Comment: (NOTE) SARS-CoV-2 target nucleic acids are NOT DETECTED.  The SARS-CoV-2 RNA is generally detectable in upper respiratory specimens during the acute phase of  infection. The lowest concentration of SARS-CoV-2 viral copies this assay can detect is 138 copies/mL. A negative result does not preclude SARS-Cov-2 infection and should not be used as the sole basis for treatment or other patient management decisions. A negative result may occur with  improper specimen collection/handling, submission of specimen other than nasopharyngeal swab, presence of viral mutation(s) within the areas targeted by this assay, and inadequate number of viral copies(<138 copies/mL). A negative result must be combined with clinical observations, patient history, and epidemiological information. The expected result is Negative.  Fact Sheet for Patients:  BloggerCourse.com  Fact Sheet for Healthcare Providers:  SeriousBroker.it  This test is no t yet approved or cleared by the [place] FDA and  has been authorized for detection and/or diagnosis of SARS-CoV-2 by FDA under an Emergency Use Authorization (EUA). This EUA will remain  in effect (meaning this test can be used) for the duration of the COVID-19 declaration under Section 564(b)(1) of the Act, 21 U.S.C.section 360bbb-3(b)(1), unless the authorization is terminated  or revoked sooner.       Influenza A by PCR NEGATIVE NEGATIVE Final   Influenza B by PCR NEGATIVE NEGATIVE Final    Comment: (NOTE) The Xpert Xpress SARS-CoV-2/FLU/RSV plus assay is intended as an aid in the diagnosis of influenza from Nasopharyngeal swab specimens and should not be used as a sole basis for treatment. Nasal washings and aspirates are unacceptable for Xpert Xpress SARS-CoV-2/FLU/RSV testing.  Fact Sheet for Patients: BloggerCourse.com  Fact Sheet for Healthcare Providers: SeriousBroker.it  This test is not yet approved or cleared by the [place] FDA and has been authorized for detection and/or diagnosis of SARS-CoV-2  by FDA under an Emergency Use Authorization (EUA). This EUA will remain in effect (meaning this test can be used) for the duration of the COVID-19 declaration under Section 564(b)(1) of the Act, 21 U.S.C. section 360bbb-3(b)(1), unless the authorization is terminated or revoked.  Performed at [facility], [address], [place], [place] [address]         Radiology Studies: No results found.      Scheduled Meds:  calcium carbonate  400 mg of elemental calcium Oral BID   Chlorhexidine Gluconate Cloth  6 each Topical Q0600   gabapentin  300 mg Oral QHS   heparin  5,000 Units Subcutaneous Q8H   Vitamin D (Ergocalciferol)  50,000 Units Oral Q7 days   Continuous Infusions:  LOS: 5 days     Jacquelin Hawkingalph Jaysiah Marchetta, MD [facility] 10/03/2020, 1:03 PM  If 7PM-7AM, please contact night-coverage [email or web address]

## 2020-10-03 NOTE — Progress Notes (Signed)
[facility] NEPHROLOGY PROGRESS NOTE  Assessment/ Plan:  # Acute kidney injury: This is secondary to pigment nephropathy/rhabdomyolysis.  Anuric and with dense renal injury.  Started hemodialysis for management of azotemia/myoglobin clearance as efforts at diuresis with volume expansion/furosemide were unsuccessful.  He is status post right IJ temporary dialysis catheter placement by IR.  The first attempt for HD on 8/19 was unsuccessful because of catheter issue. He actually had first dialysis on 8/19 and then had second dialysis yesterday on 8/20 with around 2 L of ultrafiltration.  He is tolerating well.  Although, he is clinically looks good the CK level keep going up.  He will probably benefit from extra dialysis tomorrow to minimize renal injury by clearing myoglobin. Continue daily lab, strict ins and out and assess daily for dialysis need.  # Hyperkalemia: Secondary to rhabdomyolysis and associated acute kidney injury.  Corrected with medical management and HD.  #  Rhabdomyolysis, nontraumatic: Secondary to prolonged dependent posturing of the left lower extremity following drug use.  Starting dialysis as he developed AKI.  # Polysubstance abuse: Educated about the need for cessation/abstinence.   #Hypocalcemia: Repleted with calcium and vitamin D.  Monitor lab.  Subjective: Seen and examined at bedside.  He had second dialysis yesterday.  Denies nausea vomiting chest pain shortness of breath.  Reports of muscle pain.  Objective Vital signs in last 24 hours: Vitals:   10/03/20 0313 10/03/20 0500 10/03/20 0700 10/03/20 1135  BP: (!) 142/87  (!) 158/101 (!) 141/88  Pulse: 76  77 73  Resp: 10  14 15   Temp: 98.4 F (36.9 C)  (!) 97.5 F (36.4 C) 98.4 F (36.9 C)  TempSrc: Oral  Oral Oral  SpO2: 90%  92% 93%  Weight:  74.1 kg    Height:       Weight change:   Intake/Output Summary (Last 24 hours) at 10/03/2020 1152 Last data filed at 10/03/2020 0945 Gross per 24  hour  Intake 660 ml  Output 2050 ml  Net -1390 ml        Labs: Basic Metabolic Panel: Recent Labs  Lab 10/01/20 0432 10/02/20 0703 10/03/20 0345  NA 130* 130* 133*  K 4.5 4.2 4.0  CL 95* 95* 96*  CO2 22 23 27   GLUCOSE 105* 107* 155*  BUN 71* 38* 27*  CREATININE 9.39* 6.57* 5.49*  CALCIUM 5.9* 7.2* 7.6*  PHOS 7.6* 5.2* 5.3*    Liver Function Tests: Recent Labs  Lab 10/01/20 0432 10/02/20 0703 10/03/20 0345  AST 808* 776* 606*  ALT 345* 373* 351*  ALKPHOS 36* 44 43  BILITOT 1.0 0.7 0.7  PROT 4.5* 5.4* 5.3*  ALBUMIN 2.0*  2.0* 2.2*  2.3* 2.2*  2.1*    No results for input(s): LIPASE, AMYLASE in the last 168 hours. No results for input(s): AMMONIA in the last 168 hours. CBC: Recent Labs  Lab 09/28/20 1617 09/29/20 0406 09/30/20 0220 10/02/20 0703 10/03/20 0345  WBC 27.3* 24.0* 14.8* 9.0 9.1  NEUTROABS 23.6* 20.9*  --   --   --   HGB 18.3* 14.1 11.1* 10.9* 11.3*  HCT 57.4* 40.8 30.9* 31.5* 32.9*  MCV 99.1 92.7 89.0 90.5 91.9  PLT 316 213 181 146* 135*    Cardiac Enzymes: Recent Labs  Lab 09/28/20 1800 09/30/20 0220 10/01/20 0432 10/02/20 0703 10/03/20 0345  CKTOTAL >50,000* >50,000* >50,000* 31,903* >50,000*    CBG: No results for input(s): GLUCAP in the last 168 hours.  Iron Studies: No results for  input(s): IRON, TIBC, TRANSFERRIN, FERRITIN in the last 72 hours. Studies/Results: No results found.  Medications: Infusions:    Scheduled Medications:  calcium carbonate  400 mg of elemental calcium Oral BID   Chlorhexidine Gluconate Cloth  6 each Topical Q0600   gabapentin  300 mg Oral QHS   heparin  5,000 Units Subcutaneous Q8H   Vitamin D (Ergocalciferol)  50,000 Units Oral Q7 days    have reviewed scheduled and prn medications.  Physical Exam: General:NAD, comfortable, able to lie flat Heart:RRR, s1s2 nl Lungs:clear b/l, no crackle Abdomen:soft, Non-tender, non-distended Extremities: Lower extremities edema is much  better. Dialysis Access: Right IJ temporary HD catheter placed by IR on 8/18  Nashika Coker Prasad Jayleene Glaeser 10/03/2020,11:52 AM  LOS: 5 days

## 2020-10-04 LAB — RENAL FUNCTION PANEL
Albumin: 2.1 g/dL — ABNORMAL LOW (ref 3.5–5.0)
Anion gap: 13 (ref 5–15)
BUN: 52 mg/dL — ABNORMAL HIGH (ref 6–20)
CO2: 23 mmol/L (ref 22–32)
Calcium: 7.5 mg/dL — ABNORMAL LOW (ref 8.9–10.3)
Chloride: 94 mmol/L — ABNORMAL LOW (ref 98–111)
Creatinine, Ser: 8.47 mg/dL — ABNORMAL HIGH (ref 0.61–1.24)
GFR, Estimated: 7 mL/min — ABNORMAL LOW (ref >60)
Glucose, Bld: 96 mg/dL (ref 70–99)
Phosphorus: 6.6 mg/dL — ABNORMAL HIGH (ref 2.5–4.6)
Potassium: 4.7 mmol/L (ref 3.5–5.1)
Sodium: 130 mmol/L — ABNORMAL LOW (ref 135–145)

## 2020-10-04 LAB — ALT: ALT: 293 U/L — ABNORMAL HIGH (ref 0–44)

## 2020-10-04 LAB — CK: Total CK: 42595 U/L — ABNORMAL HIGH (ref 49–397)

## 2020-10-04 LAB — BILIRUBIN, DIRECT: Bilirubin, Direct: <0.1 mg/dL (ref 0.0–0.2)

## 2020-10-04 LAB — PROTEIN, TOTAL: Total Protein: 5 g/dL — ABNORMAL LOW (ref 6.5–8.1)

## 2020-10-04 LAB — BILIRUBIN, TOTAL: Total Bilirubin: 0.6 mg/dL (ref 0.3–1.2)

## 2020-10-04 LAB — ALKALINE PHOSPHATASE: Alkaline Phosphatase: 44 U/L (ref 38–126)

## 2020-10-04 LAB — AST: AST: 448 U/L — ABNORMAL HIGH (ref 15–41)

## 2020-10-04 NOTE — Progress Notes (Signed)
Patient ID: Brett Butler, male   DOB: 1965-02-25, 55 y.o.   MRN: [id number] Patient is seen in follow-up for left lower extremity with myositis and rhabdomyolysis with compression injury to the sciatic nerve.  Patient does now have active plantar flexion of the ankle and toes there is no active dorsiflexion at the ankle or toes.  He does have active extension of the knee.  Patient is showing interval improvement of the sciatic nerve function.  Patient was given instructions and demonstrated dorsiflexion exercises of the ankle to minimize risk of equinus contracture.

## 2020-10-04 NOTE — Progress Notes (Signed)
Brett Butler NEPHROLOGY PROGRESS NOTE  Assessment/ Plan:  # Acute kidney injury: This is secondary to pigment nephropathy/rhabdomyolysis.  Anuric and with dense renal injury.  First HD 8/19, then #2 on 8/20.  OK to wait until 8/23, but low UOP and labs not consistent with GFR recovery.  R IJ Temp HD cath.   # Hyperkalemia: Secondary to rhabdomyolysis and associated acute kidney injury.  Corrected with medical management and HD.  #  Rhabdomyolysis, nontraumatic: Secondary to prolonged dependent posturing of the left lower extremity following drug use.  Starting dialysis as he developed AKI. Ortho following  # Polysubstance abuse: Educated about the need for cessation/abstinence.   #Hypocalcemia and HyperPhos, related to Rhabdo: Repleted with calcium and vitamin D.  Monitor lab.  Subjective:  Only 0.2L UOP recorded Last HD 8/20 CK maybe coming down now?  Objective Vital signs in last 24 hours: Vitals:   10/04/20 0330 10/04/20 0705 10/04/20 0800 10/04/20 1100  BP: (!) 153/88 (!) 163/10 (!) 153/91 (!) 142/90  Pulse: 91     Resp: 12     Temp: 98.4 F (36.9 C) 98.3 F (36.8 C)  97.8 F (36.6 C)  TempSrc: Oral Oral  Oral  SpO2: 93%     Weight:      Height:       Weight change: 0 kg  Intake/Output Summary (Last 24 hours) at 10/04/2020 1256 Last data filed at 10/04/2020 0900 Gross per 24 hour  Intake 660 ml  Output 200 ml  Net 460 ml        Labs: Basic Metabolic Panel: Recent Labs  Lab 10/02/20 0703 10/03/20 0345 10/04/20 0307  NA 130* 133* 130*  K 4.2 4.0 4.7  CL 95* 96* 94*  CO2 23 27 23   GLUCOSE 107* 155* 96  BUN 38* 27* 52*  CREATININE 6.57* 5.49* 8.47*  CALCIUM 7.2* 7.6* 7.5*  PHOS 5.2* 5.3* 6.6*    Liver Function Tests: Recent Labs  Lab 10/02/20 0703 10/03/20 0345 10/04/20 0307  AST 776* 606* 448*  ALT 373* 351* 293*  ALKPHOS 44 43 44  BILITOT 0.7 0.7 0.6  PROT 5.4* 5.3* 5.0*  ALBUMIN 2.2*  2.3* 2.2*  2.1* 2.1*    No results  for input(s): LIPASE, AMYLASE in the last 168 hours. No results for input(s): AMMONIA in the last 168 hours. CBC: Recent Labs  Lab 09/28/20 1617 09/29/20 0406 09/30/20 0220 10/02/20 0703 10/03/20 0345  WBC 27.3* 24.0* 14.8* 9.0 9.1  NEUTROABS 23.6* 20.9*  --   --   --   HGB 18.3* 14.1 11.1* 10.9* 11.3*  HCT 57.4* 40.8 30.9* 31.5* 32.9*  MCV 99.1 92.7 89.0 90.5 91.9  PLT 316 213 181 146* 135*    Cardiac Enzymes: Recent Labs  Lab 09/30/20 0220 10/01/20 0432 10/02/20 0703 10/03/20 0345 10/04/20 0307  CKTOTAL >50,000* >50,000* 31,903* >50,000* 42,595*    CBG: No results for input(s): GLUCAP in the last 168 hours.  Iron Studies: No results for input(s): IRON, TIBC, TRANSFERRIN, FERRITIN in the last 72 hours. Studies/Results: No results found.  Medications: Infusions:    Scheduled Medications:  calcium carbonate  400 mg of elemental calcium Oral BID   Chlorhexidine Gluconate Cloth  6 each Topical Q0600   gabapentin  300 mg Oral QHS   heparin  5,000 Units Subcutaneous Q8H   Vitamin D (Ergocalciferol)  50,000 Units Oral Q7 days    have reviewed scheduled and prn medications.  Physical Exam: General:NAD, comfortable, lying flat Heart:RRR, s1s2  nl Lungs:clear b/l, no crackle Abdomen:soft, Non-tender, non-distended Extremities: Lower extremities edema is much better. Dialysis Access: Right IJ temporary HD catheter placed by IR on 8/18  Dariush Mcnellis B Carla Rashad 10/04/2020,12:56 PM  LOS: 6 days

## 2020-10-04 NOTE — Progress Notes (Signed)
Physical Therapy Treatment Patient Details Name: Brett Butler MRN: [id number] DOB: Jul 23, 1965 Today's Date: 10/04/2020    History of Present Illness Pt is a 55 y.o. male who presented 09/28/20 with lethargy s/p fall after cocaine use. pt found to have ARF and L lower extremity myositis with rhabdomyolosis and possible sciatic nerve damage. PMH: polysubstance abuse    PT Comments    Pt reports improving left lower extremity sensation, strength and edema. Continues with 0/5 ankle dorsiflexion. Focus on strengthening, transfer and gait training today. Pt ambulating 25 feet with a walker at a min assist level.  Presents as a high fall risk based on slowed gait speed and balance deficits. Continue to recommend comprehensive inpatient rehab (CIR) for post-acute therapy needs.   Follow Up Recommendations  CIR;Supervision for mobility/OOB     Equipment Recommendations  Rolling walker with 5" wheels;Wheelchair (measurements PT);Wheelchair cushion (measurements PT)    Recommendations for Other Services       Precautions / Restrictions Precautions Precautions: Fall Precaution Comments: back precautions to manage pain Restrictions Weight Bearing Restrictions: No    Mobility  Bed Mobility Overal bed mobility: Needs Assistance Bed Mobility: Supine to Sit;Sit to Supine     Supine to sit: Supervision Sit to supine: Supervision   General bed mobility comments: supervision for safety, increased time/effort    Transfers Overall transfer level: Needs assistance Equipment used: Rolling walker (2 wheeled) Transfers: Sit to/from Stand Sit to Stand: Min assist;From elevated surface         General transfer comment: MinA to rise from elevated bed surface x 2. Cues for hand placement  Ambulation/Gait Ambulation/Gait assistance: Min assist Gait Distance (Feet): 25 Feet Assistive device: Rolling walker (2 wheeled) Gait Pattern/deviations: Step-to pattern;Decreased step length -  left;Decreased weight shift to left;Decreased dorsiflexion - left;Decreased stride length;Trunk flexed Gait velocity: decreased Gait velocity interpretation: <1.8 ft/sec, indicate of risk for recurrent falls General Gait Details: Cues for walker management (rolling rather than picking it up), activity pacing, sequencing/technique. Pt with improved L foot clearance; continues with no heel strike due to lack of dorsiflexion. Very slow and effortful, minA for balance   Stairs             Wheelchair Mobility    Modified Rankin (Stroke Patients Only)       Balance Overall balance assessment: Needs assistance Sitting-balance support: Bilateral upper extremity supported;Feet supported Sitting balance-Leahy Scale: Good Sitting balance - Comments: no leaning noted   Standing balance support: Bilateral upper extremity supported;During functional activity Standing balance-Leahy Scale: Fair Standing balance comment: Reliant on UE support and physical asisstance to stand.                            Cognition Arousal/Alertness: Awake/alert Behavior During Therapy: WFL for tasks assessed/performed Overall Cognitive Status: Within Functional Limits for tasks assessed                                        Exercises General Exercises - Lower Extremity Long Arc Quad: 10 reps;Seated;Left Hip Flexion/Marching: Both;5 reps;Seated    General Comments        Pertinent Vitals/Pain Pain Assessment: Faces Faces Pain Scale: Hurts little more Pain Location: L knee Pain Descriptors / Indicators: Discomfort;Grimacing;Guarding Pain Intervention(s): Monitored during session    Home Living  Prior Function            PT Goals (current goals can now be found in the care plan section) Acute Rehab PT Goals Patient Stated Goal: return home and remain independent with ADLs Potential to Achieve Goals: Good Progress towards PT goals:  Progressing toward goals    Frequency    Min 3X/week      PT Plan Current plan remains appropriate    Co-evaluation              AM-PAC PT "6 Clicks" Mobility   Outcome Measure  Help needed turning from your back to your side while in a flat bed without using bedrails?: None Help needed moving from lying on your back to sitting on the side of a flat bed without using bedrails?: A Little Help needed moving to and from a bed to a chair (including a wheelchair)?: A Little Help needed standing up from a chair using your arms (e.g., wheelchair or bedside chair)?: A Little Help needed to walk in hospital room?: A Little Help needed climbing 3-5 steps with a railing? : A Lot 6 Click Score: 18    End of Session Equipment Utilized During Treatment: Gait belt Activity Tolerance: Patient tolerated treatment well Patient left: in bed;with call bell/phone within reach Nurse Communication: Mobility status PT Visit Diagnosis: Other abnormalities of gait and mobility (R26.89);Unsteadiness on feet (R26.81);Muscle weakness (generalized) (M62.81);History of falling (Z91.81);Difficulty in walking, not elsewhere classified (R26.2);Other symptoms and signs involving the nervous system (R29.898);Pain Pain - Right/Left: Left Pain - part of body: Hip     Time: 3790-2409 PT Time Calculation (min) (ACUTE ONLY): 40 min  Charges:  $Gait Training: 23-37 mins $Therapeutic Activity: 8-22 mins                     Lillia Pauls, PT, DPT Acute Rehabilitation Services Pager [phone] Office [phone]    Norval Morton 10/04/2020, 5:17 PM

## 2020-10-04 NOTE — Progress Notes (Addendum)
PROGRESS NOTE    Brett Butler  JWJ:[id number] DOB: October 11, 1965 DOA: 09/28/2020 PCP: Lavinia Sharps, NP   Brief Narrative: Brett Butler is a 55 y.o. male with a history of polysubstance use including opiates, cocaine, cannabis in addition to schizoaffective disorder.  Patient presented secondary to being found to be lethargic by his friend.  Patient responded to Narcan when EMS arrived.  On arrival to the hospital, patient was noted to have a severe AKI and evidence of severe rhabdomyolysis.   Assessment & Plan:   Principal Problem:   ARF (acute renal failure) (HCC) Active Problems:   Rhabdomyolysis   Cocaine abuse (HCC)   Hyperkalemia   Leukocytosis   Injury of left sciatic nerve   AKI Likely pigment-induced injury from rhabdomyolysis.  Baseline creatinine of about 1.  Creatinine of 4.95 on admission with continued increase.  Patient started on IV fluids at 150 ml/hr with increase to 200 ml/hr by nephrology with no improvement in creatinine or urine output. Urinalysis with significant hemoglobin/no RBCs. Temporary HD catheter placed on 8/18 and HD initiated the same day. UOP of 200 ml for the last 24 hours. -Nephrology recommendations: Continued HD -Strict ins/out, watch urine output very carefully  Rhabdomyolysis Secondary to to prolonged time down and resultant trauma.  Advanced imaging with evidence consistent with rhabdomyolysis.  With significant amount of edema noted on advanced imaging, orthopedic surgery was consulted with recommendations for no surgical management. CK trending down now. AST/ALT trending down. -Daily CK, hepatic function testing  Left leg weakness Sciatic neuropathy Neuropathic pain In setting of prolonged time down and likely sciatic nerve damage. Concern there may not be full recovery but he has had some improvement. PT/OT recommending CIR. -Gabapentin 300 mg QHS  Hyperkalemia Secondary to AKI.  Peak potassium 6.7.  Associated peaked T waves.   Treated with Lokelma. Now resolved.  Hyponatremia -Manage with HD  Hypocalcemia Correct calcium of about 9 today in setting of hypoalbuminemia. PTH elevated with low 1,25/25-vitamin D. Improved with calcium and vitamin D replacement. -Calcium carbonate 2 tablet BID -Vitamin D 50,000 units weekly  Demand ischemia Elevated troponin in setting of rhabdomyolysis.  Peak troponin of 140 with delta troponin of 120.  No chest pain.  Cocaine abuse Patient states that he snorts cocaine.  He thinks that the cocaine may have been laced with fentanyl. Social work consulted.  Leukocytosis Likely reactive secondary to rhabdomyolysis.  Patient given Vancomycin, Flagyl and cefepime in the ED which have been discontinued. Blood cultures obtained on admission and are no growth to date.  Resolved.   DVT prophylaxis: SCDs Code Status:   Code Status: Full Code Family Communication: None at bedside Disposition Plan: Discharge likely to CIR in several days pending improvement of rhabdomyolysis, AKI/nephrology recommendations   Consultants:  Orthopedic surgery Neurology  Procedures:  None  Antimicrobials: Vancomycin IV Cefepime IV Flagyl IV    Subjective: No issues overnight. Has a lot of improvement in his ability to raise his left leg.  Objective: Vitals:   10/04/20 0330 10/04/20 0705 10/04/20 0800 10/04/20 1100  BP: (!) 153/88 (!) 163/10 (!) 153/91 (!) 142/90  Pulse: 91     Resp: 12     Temp: 98.4 F (36.9 C) 98.3 F (36.8 C)  97.8 F (36.6 C)  TempSrc: Oral Oral  Oral  SpO2: 93%     Weight:      Height:        Intake/Output Summary (Last 24 hours) at 10/04/2020 1257 Last data filed  at 10/04/2020 0900 Gross per 24 hour  Intake 660 ml  Output 200 ml  Net 460 ml    Filed Weights   10/03/20 0500 10/03/20 2315 10/04/20 0317  Weight: 74.1 kg 73.2 kg 73.2 kg    Examination:  General exam: Appears calm and comfortable Respiratory system: Clear to auscultation. Respiratory  effort normal. Cardiovascular system: S1 & S2 heard, RRR. No murmurs, rubs, gallops or clicks. Gastrointestinal system: Abdomen is nondistended, soft and nontender. No organomegaly or masses felt. Normal bowel sounds heard. Central nervous system: Alert and oriented. Left leg with 3/5 strength. No significant dorsiflexion noted, but patient with improved plantar flexion with 2/5 strength Musculoskeletal: No edema. No calf tenderness Skin: No cyanosis. No rashes Psychiatry: Judgement and insight appear normal. Mood & affect appropriate.    Data Reviewed: I have personally reviewed following labs and imaging studies  CBC Lab Results  Component Value Date   WBC 9.1 10/03/2020   RBC 3.58 (L) 10/03/2020   HGB 11.3 (L) 10/03/2020   HCT 32.9 (L) 10/03/2020   MCV 91.9 10/03/2020   MCH 31.6 10/03/2020   PLT 135 (L) 10/03/2020   MCHC 34.3 10/03/2020   RDW 13.1 10/03/2020   LYMPHSABS 1.4 09/29/2020   MONOABS 1.5 (H) 09/29/2020   EOSABS 0.0 09/29/2020   BASOSABS 0.0 09/29/2020     Last metabolic panel Lab Results  Component Value Date   NA 130 (L) 10/04/2020   K 4.7 10/04/2020   CL 94 (L) 10/04/2020   CO2 23 10/04/2020   BUN 52 (H) 10/04/2020   CREATININE 8.47 (H) 10/04/2020   GLUCOSE 96 10/04/2020   GFRNONAA 7 (L) 10/04/2020   GFRAA >60 07/30/2019   CALCIUM 7.5 (L) 10/04/2020   PHOS 6.6 (H) 10/04/2020   PROT 5.0 (L) 10/04/2020   ALBUMIN 2.1 (L) 10/04/2020   BILITOT 0.6 10/04/2020   ALKPHOS 44 10/04/2020   AST 448 (H) 10/04/2020   ALT 293 (H) 10/04/2020   ANIONGAP 13 10/04/2020    CBG (last 3)  No results for input(s): GLUCAP in the last 72 hours.   GFR: Estimated Creatinine Clearance: 9.4 mL/min (A) (by C-G formula based on SCr of 8.47 mg/dL (H)).  Coagulation Profile: No results for input(s): INR, PROTIME in the last 168 hours.  Recent Results (from the past 240 hour(s))  Culture, blood (Routine X 2) w Reflex to ID Panel     Status: None   Collection Time:  09/28/20  7:27 PM   Specimen: BLOOD  Result Value Ref Range Status   Specimen Description   Final    BLOOD RIGHT ANTECUBITAL Performed at [facility], [address] [place], [place] [address]    Special Requests   Final    Blood Culture results may not be optimal due to an inadequate volume of blood received in culture bottles BOTTLES DRAWN AEROBIC AND ANAEROBIC Performed at [facility], [address] [place], [place] [address]    Culture   Final    NO GROWTH 5 DAYS Performed at [facility], [address] [place], [place] [address]    Report Status 10/03/2020 FINAL  Final  Culture, blood (Routine X 2) w Reflex to ID Panel     Status: None   Collection Time: 09/28/20  7:32 PM   Specimen: BLOOD  Result Value Ref Range Status   Specimen Description   Final    BLOOD BLOOD RIGHT ARM Performed at [facility], [address] [place], [place] [address]    Special Requests   Final    Blood Culture results may not be optimal due to an inadequate volume of blood received in culture bottles BOTTLES DRAWN AEROBIC AND ANAEROBIC Performed at [facility], [address] [place], [place] [address]    Culture   Final    NO GROWTH 5 DAYS Performed at [facility], [address], [place], [place] [address]    Report Status 10/03/2020 FINAL  Final  Resp Panel by RT-PCR (Flu A&B, Covid) Nasopharyngeal Swab     Status: None   Collection Time: 09/28/20  8:00 PM   Specimen: Nasopharyngeal Swab; Nasopharyngeal(NP) swabs in vial transport medium  Result Value Ref Range Status   SARS Coronavirus 2 by RT PCR NEGATIVE NEGATIVE Final    Comment: (NOTE) SARS-CoV-2 target nucleic acids are NOT DETECTED.  The SARS-CoV-2 RNA is generally detectable in upper respiratory specimens during the acute phase of infection. The lowest concentration of SARS-CoV-2 viral copies this assay can detect is 138 copies/mL. A  negative result does not preclude SARS-Cov-2 infection and should not be used as the sole basis for treatment or other patient management decisions. A negative result may occur with  improper specimen collection/handling, submission of specimen other than nasopharyngeal swab, presence of viral mutation(s) within the areas targeted by this assay, and inadequate number of viral copies(<138 copies/mL). A negative result must be combined with clinical observations, patient history, and epidemiological information. The expected result is Negative.  Fact Sheet for Patients:  BloggerCourse.com  Fact Sheet for Healthcare Providers:  SeriousBroker.it  This test is no t yet approved or cleared by the [place] FDA and  has been authorized for detection and/or diagnosis of SARS-CoV-2 by FDA under an Emergency Use Authorization (EUA). This EUA will remain  in effect (meaning this test can be used) for the duration of the COVID-19 declaration under Section 564(b)(1) of the Act, 21 U.S.C.section 360bbb-3(b)(1), unless the authorization is terminated  or revoked sooner.       Influenza A by PCR NEGATIVE NEGATIVE Final   Influenza B by PCR NEGATIVE NEGATIVE Final    Comment: (NOTE) The Xpert Xpress SARS-CoV-2/FLU/RSV plus assay is intended as an aid in the diagnosis of influenza from Nasopharyngeal swab specimens and should not be used as a sole basis for treatment. Nasal washings and aspirates are unacceptable for Xpert Xpress SARS-CoV-2/FLU/RSV testing.  Fact Sheet for Patients: BloggerCourse.com  Fact Sheet for Healthcare Providers: SeriousBroker.it  This test is not yet approved or cleared by the [place] FDA and has been authorized for detection and/or diagnosis of SARS-CoV-2 by FDA under an Emergency Use Authorization (EUA). This EUA will remain in effect (meaning this test can  be used) for the duration of the COVID-19 declaration under Section 564(b)(1) of the Act, 21 U.S.C. section 360bbb-3(b)(1), unless the authorization is terminated or revoked.  Performed at [facility], [address] [place], [place] [address]         Radiology Studies: No results found.      Scheduled Meds:  calcium carbonate  400 mg of elemental calcium Oral BID   Chlorhexidine Gluconate Cloth  6 each Topical Q0600   gabapentin  300 mg Oral QHS   heparin  5,000 Units Subcutaneous Q8H   Vitamin D (Ergocalciferol)  50,000 Units Oral Q7 days   Continuous Infusions:     LOS: 6 days     Jacquelin Hawking,  MD [facility] 10/04/2020, 12:57 PM  If 7PM-7AM, please contact night-coverage [email or web address]

## 2020-10-05 LAB — HEPATIC FUNCTION PANEL
ALT: 241 U/L — ABNORMAL HIGH (ref 0–44)
AST: 310 U/L — ABNORMAL HIGH (ref 15–41)
Albumin: 2.1 g/dL — ABNORMAL LOW (ref 3.5–5.0)
Alkaline Phosphatase: 41 U/L (ref 38–126)
Bilirubin, Direct: 0.2 mg/dL (ref 0.0–0.2)
Indirect Bilirubin: 0.3 mg/dL (ref 0.3–0.9)
Total Bilirubin: 0.5 mg/dL (ref 0.3–1.2)
Total Protein: 4.9 g/dL — ABNORMAL LOW (ref 6.5–8.1)

## 2020-10-05 LAB — RENAL FUNCTION PANEL
Albumin: 2.1 g/dL — ABNORMAL LOW (ref 3.5–5.0)
Anion gap: 15 (ref 5–15)
BUN: 85 mg/dL — ABNORMAL HIGH (ref 6–20)
CO2: 19 mmol/L — ABNORMAL LOW (ref 22–32)
Calcium: 7.5 mg/dL — ABNORMAL LOW (ref 8.9–10.3)
Chloride: 92 mmol/L — ABNORMAL LOW (ref 98–111)
Creatinine, Ser: 11.15 mg/dL — ABNORMAL HIGH (ref 0.61–1.24)
GFR, Estimated: 5 mL/min — ABNORMAL LOW (ref >60)
Glucose, Bld: 83 mg/dL (ref 70–99)
Phosphorus: 7.5 mg/dL — ABNORMAL HIGH (ref 2.5–4.6)
Potassium: 5.7 mmol/L — ABNORMAL HIGH (ref 3.5–5.1)
Sodium: 126 mmol/L — ABNORMAL LOW (ref 135–145)

## 2020-10-05 LAB — CBC
HCT: 29.7 % — ABNORMAL LOW (ref 39.0–52.0)
Hemoglobin: 10.4 g/dL — ABNORMAL LOW (ref 13.0–17.0)
MCH: 32 pg (ref 26.0–34.0)
MCHC: 35 g/dL (ref 30.0–36.0)
MCV: 91.4 fL (ref 80.0–100.0)
Platelets: 201 10*3/uL (ref 150–400)
RBC: 3.25 MIL/uL — ABNORMAL LOW (ref 4.22–5.81)
RDW: 13.1 % (ref 11.5–15.5)
WBC: 12.9 10*3/uL — ABNORMAL HIGH (ref 4.0–10.5)
nRBC: 0 % (ref 0.0–0.2)

## 2020-10-05 LAB — CK: Total CK: 26694 U/L — ABNORMAL HIGH (ref 49–397)

## 2020-10-05 MED ORDER — CEFAZOLIN SODIUM-DEXTROSE 2-4 GM/100ML-% IV SOLN
2.0000 g | INTRAVENOUS | Status: AC
  Filled 2020-10-05: qty 100

## 2020-10-05 MED ORDER — POLYETHYLENE GLYCOL 3350 17 G PO PACK
17.0000 g | PACK | Freq: Every day | ORAL | Status: DC
  Administered 2020-10-05 – 2020-10-14 (×7): 17 g via ORAL
  Filled 2020-10-05 (×8): qty 1

## 2020-10-05 MED ORDER — HEPARIN SODIUM (PORCINE) 1000 UNIT/ML IJ SOLN
INTRAMUSCULAR | Status: AC
  Administered 2020-10-05: 2400 [IU]
  Filled 2020-10-05: qty 3

## 2020-10-05 MED ORDER — CALCIUM ACETATE (PHOS BINDER) 667 MG PO CAPS
1334.0000 mg | ORAL_CAPSULE | Freq: Three times a day (TID) | ORAL | Status: DC
  Administered 2020-10-05 – 2020-10-15 (×26): 1334 mg via ORAL
  Filled 2020-10-05 (×27): qty 2

## 2020-10-05 MED ORDER — DOCUSATE SODIUM 100 MG PO CAPS
100.0000 mg | ORAL_CAPSULE | Freq: Two times a day (BID) | ORAL | Status: DC
  Administered 2020-10-05 – 2020-10-21 (×28): 100 mg via ORAL
  Filled 2020-10-05 (×31): qty 1

## 2020-10-05 NOTE — Consult Note (Signed)
Chief Complaint: Ongoing dialysis access. Request is for temp to tunneled conversion. Temp placed by IR on 8.19.22  Referring Physician(s): Dr. Britt Bottom  Supervising Physician: Malachy Moan  Patient Status: [facility] - In-pt  History of Present Illness: Brett Butler is a 55 y.o. male History of polysubstance abuse with no significant medical history. Presented to the ED at [facility] after syncopal episode. Found to be in AKI. IR placed a RIJ temp HD catheter placed by IR on 8.19.22. Team is requesting a temp to tunneled HD cathter for ongoing use as an outpatient.   Patient alert and laying in bed, calm. Currently reporting right leg pain. Denies any fevers, headache, chest pain, SOB, cough, abdominal pain, nausea, vomiting or bleeding. Return precautions and treatment recommendations and follow-up discussed with the patient who is agreeable with the plan. All of the patient's questions were answered, patient is agreeable to proceed   History reviewed. No pertinent past medical history.  Past Surgical History:  Procedure Laterality Date   ANKLE SURGERY     IR FLUORO GUIDE CV LINE RIGHT  09/30/2020   IR US GUIDE VASC ACCESS RIGHT  09/30/2020    Allergies: Other  Medications: Prior to Admission medications   Medication Sig Start Date End Date Taking? Authorizing Provider  cyclobenzaprine (FLEXERIL) 10 MG tablet Take 10 mg by mouth 2 (two) times daily as needed for muscle spasms.   Yes [provider]  DULoxetine (CYMBALTA) 30 MG capsule Take 1 capsule (30 mg total) by mouth 2 (two) times daily. Patient not taking: Reported on 06/15/2020 09/10/19 09/09/20  Zena Amos, MD  loratadine (CLARITIN) 10 MG tablet Take 10 mg by mouth daily as needed for allergies. Patient not taking: Reported on 09/29/2020    [provider]  lurasidone (LATUDA) 40 MG TABS tablet Take 1 tablet (40 mg total) by mouth daily with supper. Patient not taking: Reported on 09/29/2020 09/10/19   Zena Amos, MD     History reviewed. No pertinent family history.  Social History   Socioeconomic History   Marital status: Legally Separated    Spouse name: Not on file   Number of children: Not on file   Years of education: Not on file   Highest education level: Not on file  Occupational History   Not on file  Tobacco Use   Smoking status: Every Day    Types: Cigarettes   Smokeless tobacco: Never  Substance and Sexual Activity   Alcohol use: Yes    Alcohol/week: 10.0 standard drinks    Types: 10 Cans of beer per week   Drug use: Yes    Types: Cocaine   Sexual activity: Not on file  Other Topics Concern   Not on file  Social History Narrative   Not on file   Social Determinants of Health   Financial Resource Strain: Not on file  Food Insecurity: Not on file  Transportation Needs: Not on file  Physical Activity: Not on file  Stress: Not on file  Social Connections: Not on file     Review of Systems: A 12 point ROS discussed and pertinent positives are indicated in the HPI above.  All other systems are negative.  Review of Systems  Constitutional:  Negative for fever.  HENT:  Negative for congestion.   Respiratory:  Negative for cough and shortness of breath.   Cardiovascular:  Negative for chest pain.  Gastrointestinal:  Negative for abdominal pain.  Musculoskeletal:  Positive for arthralgias (right leg pain.  Better with pain medication. Worse when it wears off.).  Neurological:  Negative for headaches.  Psychiatric/Behavioral:  Negative for behavioral problems and confusion.    Vital Signs: BP (!) 131/98 (BP Location: Left Arm)   Pulse (!) 110   Temp 99.1 F (37.3 C) (Oral)   Resp 10   Ht 5\' 8"  (1.727 m)   Wt 161 lb 6 oz (73.2 kg)   SpO2 93%   BMI 24.54 kg/m   Physical Exam Vitals and nursing note reviewed.  Constitutional:      Appearance: He is well-developed.  HENT:     Head: Normocephalic.  Cardiovascular:     Comments: RIJ temp HD   catheter Pulmonary:     Effort: Pulmonary effort is normal.  Musculoskeletal:        General: Normal range of motion.     Cervical back: Normal range of motion.  Skin:    General: Skin is dry.  Neurological:     Mental Status: He is alert and oriented to person, place, and time.    Imaging: CT Abdomen Pelvis Wo Contrast  Result Date: 09/28/2020 CLINICAL DATA:  Left leg pain EXAM: CT ABDOMEN AND PELVIS WITHOUT CONTRAST TECHNIQUE: Multidetector CT imaging of the abdomen and pelvis was performed following the standard protocol without IV contrast. COMPARISON:  None. FINDINGS: LOWER CHEST: Bibasilar atelectasis HEPATOBILIARY: Normal hepatic contours. No intra- or extrahepatic biliary dilatation. The gallbladder is normal. PANCREAS: Normal pancreas. No ductal dilatation or peripancreatic fluid collection. SPLEEN: Normal. ADRENALS/URINARY TRACT: The adrenal glands are normal. No hydronephrosis, nephroureterolithiasis or solid renal mass. The urinary bladder is normal for degree of distention STOMACH/BOWEL: There is no hiatal hernia. Normal duodenal course and caliber. No small bowel dilatation or inflammation. No focal colonic abnormality. Normal appendix. VASCULAR/LYMPHATIC: There is calcific atherosclerosis of the abdominal aorta. No lymphadenopathy. REPRODUCTIVE: Normal prostate size with symmetric seminal vesicles. MUSCULOSKELETAL. No bony spinal canal stenosis or focal osseous abnormality. OTHER: Large intramuscular hematoma the left thigh is better characterized on concomitant CT of the left lower extremity. IMPRESSION: 1. Large intramuscular hematoma the left thigh is better characterized on concomitant CT of the left lower extremity. 2. No acute abnormality of the abdomen or pelvis. Aortic Atherosclerosis (ICD10-I70.0). Electronically Signed   By: 09/30/2020 M.D.   On: 09/28/2020 20:41   DG Lumbar Spine Complete  Result Date: 09/28/2020 CLINICAL DATA:  Fall with left leg pain. EXAM: LUMBAR  SPINE - COMPLETE 4+ VIEW COMPARISON:  Remote radiograph 04/24/2008 FINDINGS: Again seen transitional lumbosacral anatomy. There is no evidence of fracture. Normal alignment. Vertebral body heights are normal. Minor endplate spurring at multiple levels. Disc space narrowing at the lumbosacral junction. Remaining disc spaces are preserved. Minor lower lumbar facet hypertrophy. Aorto bi-iliac atherosclerosis is age advanced. IMPRESSION: 1. No acute fracture or subluxation of the lumbar spine. 2. Mild spondylosis with endplate spurring and facet hypertrophy. 3. Age advanced aortoiliac atherosclerosis. Electronically Signed   By: 06/24/2008 M.D.   On: 09/28/2020 17:10   CT HEAD WO CONTRAST (09/30/2020)  Result Date: 09/29/2020 CLINICAL DATA:  Mental status change, unknown cause EXAM: CT HEAD WITHOUT CONTRAST TECHNIQUE: Contiguous axial images were obtained from the base of the skull through the vertex without intravenous contrast. COMPARISON:  04/09/2013 FINDINGS: Brain: No acute intracranial abnormality. Specifically, no hemorrhage, hydrocephalus, mass lesion, acute infarction, or significant intracranial injury. Vascular: No hyperdense vessel or unexpected calcification. Skull: No acute calvarial abnormality. Sinuses/Orbits: Mucosal thickening in the ethmoid air cells. Air-fluid levels in  the maxillary sinuses. Other: None IMPRESSION: No acute intracranial abnormality. Acute on chronic sinusitis. Electronically Signed   By: Charlett Nose M.D.   On: 09/29/2020 03:17   MR LUMBAR SPINE WO CONTRAST  Result Date: 09/29/2020 CLINICAL DATA:  Low back pain, cauda equina syndrome suspected EXAM: MRI LUMBAR SPINE WITHOUT CONTRAST TECHNIQUE: Multiplanar, multisequence MR imaging of the lumbar spine was performed. No intravenous contrast was administered. COMPARISON:  None. FINDINGS: Segmentation:  Standard. Alignment:  Physiologic. Vertebrae:  Mild discogenic endplate edema at L5 Conus medullaris and cauda equina: Conus  extends to the L1 level. Conus and cauda equina appear normal. Paraspinal and other soft tissues: Edema within the right greater than left paraspinous muscles. No abnormal signal within the iliopsoas muscles. Disc levels: No spinal canal stenosis or neural impingement.  No disc herniation. IMPRESSION: 1. Edema within the right greater than left paraspinous muscles. No iliopsoas muscle signal abnormality. 2. Normal alignment. No spinal canal or neural foraminal stenosis. Electronically Signed   By: Deatra Robinson M.D.   On: 09/29/2020 03:32   MR PELVIS WO CONTRAST  Result Date: 09/29/2020 CLINICAL DATA:  Left buttock pain. History of cocaine abuse. Elevated CK and potassium. EXAM: MRI PELVIS WITHOUT CONTRAST TECHNIQUE: Multiplanar multisequence MR imaging of the pelvis was performed. No intravenous contrast was administered. COMPARISON:  CT abdomen pelvis from yesterday. FINDINGS: Bones/Joint/Cartilage No suspicious marrow signal abnormality. No fracture or dislocation. Mild bilateral hip osteoarthritis. No joint effusion. Muscles and Tendons Prominent, patchy muscle edema involving the left gluteal muscles. Milder diffuse edema in the visualized lower paraspinous muscles. Edema within the right quadratus femoris muscle. Edema within the left adductor and hamstring muscles as described on separate MRI left thigh report from same day. Soft tissue No fluid collection or hematoma. No soft tissue mass. The visualized internal pelvic contents are unremarkable. IMPRESSION: 1. Prominent left gluteal and upper thigh muscle edema most consistent with rhabdomyolysis given clinical history. 2. Isolated edema within the right quadratus femoris muscle may be related to rhabdomyolysis as well, but can also be seen with ischiofemoral impingement 3. Mild bilateral hip osteoarthritis. Electronically Signed   By: Obie Dredge M.D.   On: 09/29/2020 05:17   MR FEMUR LEFT WO CONTRAST  Result Date: 09/29/2020 CLINICAL DATA:   Left thigh swelling. History of cocaine abuse. Elevated CK and potassium. EXAM: MR OF THE LEFT FEMUR WITHOUT CONTRAST TECHNIQUE: Multiplanar, multisequence MR imaging of the left thigh was performed. No intravenous contrast was administered. COMPARISON:  CT left leg from same day. FINDINGS: Bones/Joint/Cartilage No marrow signal abnormality. No fracture or dislocation. No joint effusion. Muscles and Tendons Severe muscle edema diffusely involving the adductor and hamstring muscle compartments, as well as the visualized obturator, gluteus maximus, and gracilis muscles, with associated interfascial fluid. No increased T1 hyperintensity to suggest muscle hemorrhage. Soft tissue Prominent soft tissue swelling in the medial and posterior thigh. No fluid collection or hematoma. No soft tissue mass. IMPRESSION: 1. Severe muscle edema involving the medial and posterior muscle compartments of the left thigh, most consistent with rhabdomyolysis given clinical history. Electronically Signed   By: Obie Dredge M.D.   On: 09/29/2020 05:09   IR Fluoro Guide CV Line Right  Result Date: 09/30/2020 INDICATION: 54 year old male referred for temporary hemodialysis catheter EXAM: IMAGE GUIDED TEMPORARY HEMODIALYSIS CATHETER MEDICATIONS: None ANESTHESIA/SEDATION: None FLUOROSCOPY TIME:  Fluoroscopy Time: 0 minutes 6 seconds (0 mGy). COMPLICATIONS: None PROCEDURE: Informed written consent was obtained from the patient after a thorough discussion of the procedural  risks, benefits and alternatives. All questions were addressed. Maximal Sterile Barrier Technique was utilized including caps, mask, sterile gowns, sterile gloves, sterile drape, hand hygiene and skin antiseptic. A timeout was performed prior to the initiation of the procedure. The right neck and chest was prepped with chlorhexidine, and draped in the usual sterile fashion using maximum barrier technique (cap and mask, sterile gown, sterile gloves, large sterile sheet,  hand hygiene and cutaneous antiseptic). Local anesthesia was attained by infiltration with 1% lidocaine without epinephrine. Ultrasound demonstrated patency of the right internal jugular vein, and this was documented with an image. Under real-time ultrasound guidance, this vein was accessed with a 21 gauge micropuncture needle and image documentation was performed. A small dermatotomy was made at the access site with an 11 scalpel. A 0.018" wire was advanced into the SVC and the access needle exchanged for a 19F micropuncture vascular sheath. 035 wire was advanced into the IVC. A 15 cm catheter was selected. Skin and subcutaneous tissues were serially dilated. Catheter was placed on the wire. The catheter tip is positioned in the upper right atrium. This was documented with a spot image. Both ports of the hemodialysis catheter were then tested for excellent function. The ports were then locked with heparinized lock. Patient tolerated the procedure well and remained hemodynamically stable throughout. No complications were encountered and no significant blood loss was encountered. IMPRESSION: Status post right IJ temp HD catheter placement. Signed, Yvone NeuJaime S. Reyne DumasWagner, DO, RPVI [facility] Electronically Signed   By: Gilmer MorJaime  Wagner D.O.   On: 09/30/2020 11:13   IR US Guide Vasc Access Right  Result Date: 09/30/2020 INDICATION: 55 year old male referred for temporary hemodialysis catheter EXAM: IMAGE GUIDED TEMPORARY HEMODIALYSIS CATHETER MEDICATIONS: None ANESTHESIA/SEDATION: None FLUOROSCOPY TIME:  Fluoroscopy Time: 0 minutes 6 seconds (0 mGy). COMPLICATIONS: None PROCEDURE: Informed written consent was obtained from the patient after a thorough discussion of the procedural risks, benefits and alternatives. All questions were addressed. Maximal Sterile Barrier Technique was utilized including caps, mask, sterile gowns, sterile gloves, sterile drape, hand  hygiene and skin antiseptic. A timeout was performed prior to the initiation of the procedure. The right neck and chest was prepped with chlorhexidine, and draped in the usual sterile fashion using maximum barrier technique (cap and mask, sterile gown, sterile gloves, large sterile sheet, hand hygiene and cutaneous antiseptic). Local anesthesia was attained by infiltration with 1% lidocaine without epinephrine. Ultrasound demonstrated patency of the right internal jugular vein, and this was documented with an image. Under real-time ultrasound guidance, this vein was accessed with a 21 gauge micropuncture needle and image documentation was performed. A small dermatotomy was made at the access site with an 11 scalpel. A 0.018" wire was advanced into the SVC and the access needle exchanged for a 19F micropuncture vascular sheath. 035 wire was advanced into the IVC. A 15 cm catheter was selected. Skin and subcutaneous tissues were serially dilated. Catheter was placed on the wire. The catheter tip is positioned in the upper right atrium. This was documented with a spot image. Both ports of the hemodialysis catheter were then tested for excellent function. The ports were then locked with heparinized lock. Patient tolerated the procedure well and remained hemodynamically stable throughout. No complications were encountered and no significant blood loss was encountered. IMPRESSION: Status post right IJ temp HD catheter placement. Signed, Yvone NeuJaime S. Reyne DumasWagner, DO, RPVI [facility] Electronically Signed   By: Gilmer MorJaime  Wagner D.O.  On: 09/30/2020 11:13   CT EXTREMITY LOWER LEFT WO CONTRAST  Result Date: 09/28/2020 CLINICAL DATA:  Left leg pain and swelling. EXAM: CT OF THE LOWER LEFT EXTREMITY WITHOUT CONTRAST TECHNIQUE: Multidetector CT imaging of the lower left extremity was performed according to the standard protocol. COMPARISON:  None. FINDINGS: There is marked  enlargement of the left thigh. the left adductor muscle compartment is markedly enlarged and the muscles appear edematous. There is also surrounding inflammatory changes and fluid. This process continues down into the posterior compartment of the thigh and terminates just above the knee. There is also some associated subcutaneous soft tissue swelling/edema/fluid. The anterior compartment of the knee is unremarkable. I do not see a discrete intramuscular hematoma. This has more the appearance of edema/myositis. Findings could be due to viral myositis, other post infectious myositis, muscle infarcts or drug related. Could not exclude compartment syndrome given the amount of swelling and edema. However, this is a clinical diagnosis. I do not see any significant findings below the knee. The femur is intact. The tibia and fibula are intact. Joint spaces are maintained. No findings suspicious for septic arthritis or osteomyelitis. Scattered arterial calcifications. IMPRESSION: 1. Marked enlargement of the left adductor and hamstring muscle compartments with surrounding inflammatory changes and intermuscular fluid. This has the appearance of edema/myositis. No definite hematoma. Findings could be due to viral myositis, other post infectious myositis, muscle infarcts or drug related. Could not exclude compartment syndrome given the amount of swelling and edema. However, this is a clinical diagnosis. 2. No significant bony findings. Fracture, septic arthritis or osteomyelitis. Electronically Signed   By: Rudie Meyer M.D.   On: 09/28/2020 20:44   DG Hip Unilat W or Wo Pelvis 2-3 Views Left  Result Date: 09/28/2020 CLINICAL DATA:  Fall with left leg pain. EXAM: DG HIP (WITH OR WITHOUT PELVIS) 2-3V LEFT COMPARISON:  None. FINDINGS: There is mild left hip joint space narrowing and acetabular spurring. The femoral head is well seated. There is no evidence of fracture. The pubic rami are intact. Pubic symphysis and  sacroiliac joints are congruent. No evidence of a vascular necrosis or focal bone abnormality. Vascular calcifications are seen. IMPRESSION: Mild left hip osteoarthritis. No acute fracture. Electronically Signed   By: Narda Rutherford M.D.   On: 09/28/2020 17:13    Labs:  CBC: Recent Labs    09/30/20 0220 10/02/20 0703 10/03/20 0345 10/05/20 0730  WBC 14.8* 9.0 9.1 12.9*  HGB 11.1* 10.9* 11.3* 10.4*  HCT 30.9* 31.5* 32.9* 29.7*  PLT 181 146* 135* 201    COAGS: No results for input(s): INR, APTT in the last 8760 hours.  BMP: Recent Labs    10/02/20 0703 10/03/20 0345 10/04/20 0307 10/05/20 0328  NA 130* 133* 130* 126*  K 4.2 4.0 4.7 5.7*  CL 95* 96* 94* 92*  CO2 19*  GLUCOSE 107* 155* 96 83  BUN 38* 27* 52* 85*  CALCIUM 7.2* 7.6* 7.5* 7.5*  CREATININE 6.57* 5.49* 8.47* 11.15*  GFRNONAA 9* 11* 7* 5*    LIVER FUNCTION TESTS: Recent Labs    10/02/20 0703 10/03/20 0345 10/04/20 0307 10/05/20 0328  BILITOT 0.7 0.7 0.6 0.5  AST 776* 606* 448* 310*  ALT 373* 351* 293* 241*  ALKPHOS 44 43 44 41  PROT 5.4* 5.3* 5.0* 4.9*  ALBUMIN 2.2*  2.3* 2.2*  2.1* 2.1* 2.1*  2.1*      Assessment and Plan:  55 y.o. male inpatient. History of polysubstance abuse  with no significant medical history. Presented to the ED at [facility] after syncopal episode. Found to be in AKI. IR placed a RIJ temp HD catheter placed by IR on 8.19.22. Team is requesting a temp to tunneled HD cathter for ongoing use as an outpatient.   Potassium 5.7, BUN 85, Cr 11.15, AST 310, ALT 241. WBC is 12.9. Nephrology states that they are not concerned with an infection at this time Patient is on subcutaneous prophylactic dose of heparin.  No pertinent allergies.   IR consulted for possible temp to tunneled HD. Patient tentatively scheduled for 8.24.22.  Team instructed to: Keep Patient to be NPO after midnight Hold prophylactic anticoagulation 24 hours prior to scheduled procedure.  IR will call  patient when ready.   Risks and benefits discussed with the patient including, but not limited to bleeding, infection, vascular injury, pneumothorax which may require chest tube placement, air embolism or even death  All of the patient's questions were answered, patient is agreeable to proceed. Consent signed and in chart.    Thank you for this interesting consult.  I greatly enjoyed meeting Brett Butler and look forward to participating in their care.  A copy of this report was sent to the requesting provider on this date.  Electronically Signed: Alene Mires, NP 10/05/2020, 9:41 AM   I spent a total of 40 Minutes    in face to face in clinical consultation, greater than 50% of which was counseling/coordinating care for temp to tunneled HD catheter placement.

## 2020-10-05 NOTE — Progress Notes (Signed)
Physical Therapy Treatment Patient Details Name: Brett Butler MRN: [id number] DOB: 12/02/1965 Today's Date: 10/05/2020    History of Present Illness Pt is a 55 y.o. male who presented 09/28/20 with lethargy s/p fall after cocaine use. pt found to have ARF and L lower extremity myositis with rhabdomyolosis and possible sciatic nerve damage. PMH: polysubstance abuse    PT Comments    Pt progressing steadily towards his physical therapy goals. He is requiring supervision at this time for functional mobility. Ambulating ~30 ft with a walker; demonstrates left foot clearance, but continues with no active ankle dorsiflexion. Further distance limited due to posterior LLE pain and fatigue. D/c plan updated in light of progress.     Follow Up Recommendations  Supervision for mobility/OOB;Outpatient PT     Equipment Recommendations  Rolling walker with 5" wheels;Wheelchair (measurements PT);Wheelchair cushion (measurements PT)    Recommendations for Other Services       Precautions / Restrictions Precautions Precautions: Fall Restrictions Weight Bearing Restrictions: No    Mobility  Bed Mobility Overal bed mobility: Modified Independent             General bed mobility comments: HOB elevated, increased time/effort    Transfers Overall transfer level: Needs assistance Equipment used: Rolling walker (2 wheeled) Transfers: Sit to/from Stand Sit to Stand: Supervision            Ambulation/Gait Ambulation/Gait assistance: Supervision Gait Distance (Feet): 30 Feet Assistive device: Rolling walker (2 wheeled) Gait Pattern/deviations: Step-to pattern;Decreased step length - left;Decreased weight shift to left;Decreased dorsiflexion - left;Decreased stride length;Trunk flexed Gait velocity: decreased   General Gait Details: Cues for walker management (rolling rather than picking it up), sequencing/technique. Pt with adequate left foot clearance, but continues with no active  ankle dorsiflexion   Stairs             Wheelchair Mobility    Modified Rankin (Stroke Patients Only)       Balance Overall balance assessment: Needs assistance   Sitting balance-Leahy Scale: Good     Standing balance support: Bilateral upper extremity supported;During functional activity Standing balance-Leahy Scale: Fair Standing balance comment: Reliant on UE support and physical asisstance to stand.                            Cognition Arousal/Alertness: Awake/alert Behavior During Therapy: WFL for tasks assessed/performed Overall Cognitive Status: Within Functional Limits for tasks assessed                                        Exercises      General Comments        Pertinent Vitals/Pain Pain Assessment: Faces Faces Pain Scale: Hurts little more Pain Location: L knee Pain Descriptors / Indicators: Discomfort;Grimacing;Guarding Pain Intervention(s): Monitored during session;Limited activity within patient's tolerance    Home Living                      Prior Function            PT Goals (current goals can now be found in the care plan section) Acute Rehab PT Goals Patient Stated Goal: return home and remain independent with ADLs Potential to Achieve Goals: Good Progress towards PT goals: Progressing toward goals    Frequency    Min 3X/week      PT Plan Discharge plan  needs to be updated    Co-evaluation              AM-PAC PT "6 Clicks" Mobility   Outcome Measure  Help needed turning from your back to your side while in a flat bed without using bedrails?: None Help needed moving from lying on your back to sitting on the side of a flat bed without using bedrails?: A Little Help needed moving to and from a bed to a chair (including a wheelchair)?: A Little Help needed standing up from a chair using your arms (e.g., wheelchair or bedside chair)?: A Little Help needed to walk in hospital room?:  A Little Help needed climbing 3-5 steps with a railing? : A Lot 6 Click Score: 18    End of Session Equipment Utilized During Treatment: Gait belt Activity Tolerance: Patient tolerated treatment well Patient left: with call bell/phone within reach;in chair;with chair alarm set Nurse Communication: Mobility status PT Visit Diagnosis: Other abnormalities of gait and mobility (R26.89);Unsteadiness on feet (R26.81);Muscle weakness (generalized) (M62.81);History of falling (Z91.81);Difficulty in walking, not elsewhere classified (R26.2);Other symptoms and signs involving the nervous system (R29.898);Pain Pain - Right/Left: Left Pain - part of body: Hip     Time: 2297-9892 PT Time Calculation (min) (ACUTE ONLY): 28 min  Charges:  $Gait Training: 8-22 mins $Therapeutic Activity: 8-22 mins                     Lillia Pauls, PT, DPT Acute Rehabilitation Services Pager [phone] Office [phone]    Norval Morton 10/05/2020, 4:46 PM

## 2020-10-05 NOTE — Progress Notes (Addendum)
PROGRESS NOTE    Brett Butler  XLK:[id number] DOB: 03-22-1965 DOA: 09/28/2020 PCP: Lavinia Sharps, NP   Brief Narrative: Brett Butler is a 55 y.o. male with a history of polysubstance use including opiates, cocaine, cannabis in addition to schizoaffective disorder.  Patient presented secondary to being found to be lethargic by his friend.  Patient responded to Narcan when EMS arrived.  On arrival to the hospital, patient was noted to have a severe AKI and evidence of severe rhabdomyolysis requiring HD.    Assessment & Plan:   Principal Problem:   ARF (acute renal failure) (HCC) Active Problems:   Rhabdomyolysis   Cocaine abuse (HCC)   Hyperkalemia   Leukocytosis   Injury of left sciatic nerve   AKI Likely pigment-induced injury from rhabdomyolysis.  Baseline creatinine of about 1.  Creatinine of 4.95 on admission with continued increase.  Patient started on IV fluids at 150 ml/hr with increase to 200 ml/hr by nephrology with no improvement in creatinine or urine output. Urinalysis with significant hemoglobin/no RBCs. Temporary HD catheter placed on 8/18 and HD initiated the same day. UOP of 200 ml for the last 24 hours. -Nephrology recommendations: Continued HD -Strict ins/out, watch urine output very carefully  Rhabdomyolysis Secondary to to prolonged time down and resultant trauma.  Advanced imaging with evidence consistent with rhabdomyolysis.  With significant amount of edema noted on advanced imaging, orthopedic surgery was consulted with recommendations for no surgical management. CK trending down now. AST/ALT trending down. -Daily CK, hepatic function testing  Left leg weakness Sciatic neuropathy Neuropathic pain In setting of prolonged time down and likely sciatic nerve damage. Concern there may not be full recovery but he has had some improvement. PT/OT recommending CIR. -Gabapentin 300 mg QHS  Hyperkalemia Secondary to AKI.  Peak potassium 6.7.  Associated  peaked T waves.  Treated with Lokelma. Now resolved.  Hyponatremia -Manage with HD  Hypocalcemia Correct calcium of about 9 today in setting of hypoalbuminemia. PTH elevated with low 1,25/25-vitamin D. Improved with calcium and vitamin D replacement. -Calcium carbonate 2 tablet BID -Vitamin D 50,000 units weekly  Demand ischemia Elevated troponin in setting of rhabdomyolysis.  Peak troponin of 140 with delta troponin of 120.  No chest pain.  Cocaine abuse Patient states that he snorts cocaine.  He thinks that the cocaine may have been laced with fentanyl. Social work consulted.  Leukocytosis Likely reactive secondary to rhabdomyolysis.  Patient given Vancomycin, Flagyl and cefepime in the ED which have been discontinued. Blood cultures obtained on admission and are no growth to date.  Resolved.   DVT prophylaxis: SCDs Code Status:   Code Status: Full Code Family Communication: None at bedside Disposition Plan: Discharge likely to CIR in several days pending improvement of rhabdomyolysis, AKI/nephrology recommendations   Consultants:  Orthopedic surgery Neurology  Procedures:  None  Antimicrobials: Vancomycin IV Cefepime IV Flagyl IV    Subjective: Continues to work on his leg mobility. No issues overnight.  Objective: Vitals:   10/05/20 1030 10/05/20 1035 10/05/20 1047 10/05/20 1500  BP: (!) 123/91 140/84  (!) 141/81  Pulse: 98 93    Resp: 16 14    Temp:  (!) 97.5 F (36.4 C)  98.2 F (36.8 C)  TempSrc:  Oral  Oral  SpO2:  92%    Weight:   72.6 kg   Height:        Intake/Output Summary (Last 24 hours) at 10/05/2020 1732 Last data filed at 10/05/2020 1455 Gross per 24  hour  Intake 956 ml  Output 2500 ml  Net -1544 ml    Filed Weights   10/05/20 0500 10/05/20 0722 10/05/20 1047  Weight: 73.2 kg 73.2 kg 72.6 kg    Examination:  General exam: Appears calm and comfortable. Seen in HD Respiratory system: Clear to auscultation. Respiratory effort  normal. Cardiovascular system: S1 & S2 heard, RRR. No murmurs, rubs, gallops or clicks. Gastrointestinal system: Abdomen is nondistended, soft and nontender. No organomegaly or masses felt. Normal bowel sounds heard. Central nervous system: Alert and oriented. LLE: 3/5 leg extension, 0/5 dorsiflexion and 2/5 plantarflexion strength Musculoskeletal: Edema up to thigh/buttock edema. No calf tenderness. Skin: No cyanosis. No rashes Psychiatry: Judgement and insight appear normal. Mood & affect appropriate.    Data Reviewed: I have personally reviewed following labs and imaging studies  CBC Lab Results  Component Value Date   WBC 12.9 (H) 10/05/2020   RBC 3.25 (L) 10/05/2020   HGB 10.4 (L) 10/05/2020   HCT 29.7 (L) 10/05/2020   MCV 91.4 10/05/2020   MCH 32.0 10/05/2020   PLT 201 10/05/2020   MCHC 35.0 10/05/2020   RDW 13.1 10/05/2020   LYMPHSABS 1.4 09/29/2020   MONOABS 1.5 (H) 09/29/2020   EOSABS 0.0 09/29/2020   BASOSABS 0.0 09/29/2020     Last metabolic panel Lab Results  Component Value Date   NA 126 (L) 10/05/2020   K 5.7 (H) 10/05/2020   CL 92 (L) 10/05/2020   CO2 19 (L) 10/05/2020   BUN 85 (H) 10/05/2020   CREATININE 11.15 (H) 10/05/2020   GLUCOSE 83 10/05/2020   GFRNONAA 5 (L) 10/05/2020   GFRAA >60 07/30/2019   CALCIUM 7.5 (L) 10/05/2020   PHOS 7.5 (H) 10/05/2020   PROT 4.9 (L) 10/05/2020   ALBUMIN 2.1 (L) 10/05/2020   ALBUMIN 2.1 (L) 10/05/2020   BILITOT 0.5 10/05/2020   ALKPHOS 41 10/05/2020   AST 310 (H) 10/05/2020   ALT 241 (H) 10/05/2020   ANIONGAP 15 10/05/2020    CBG (last 3)  No results for input(s): GLUCAP in the last 72 hours.   GFR: Estimated Creatinine Clearance: 7.2 mL/min (A) (by C-G formula based on SCr of 11.15 mg/dL (H)).  Coagulation Profile: No results for input(s): INR, PROTIME in the last 168 hours.  Recent Results (from the past 240 hour(s))  Culture, blood (Routine X 2) w Reflex to ID Panel     Status: None   Collection  Time: 09/28/20  7:27 PM   Specimen: BLOOD  Result Value Ref Range Status   Specimen Description   Final    BLOOD RIGHT ANTECUBITAL Performed at [facility], [address], [place], [place] [address]    Special Requests   Final    Blood Culture results may not be optimal due to an inadequate volume of blood received in culture bottles BOTTLES DRAWN AEROBIC AND ANAEROBIC Performed at [facility], [address], [place], [place] [address]    Culture   Final    NO GROWTH 5 DAYS Performed at [facility], [address], [place], [place] [address]    Report Status 10/03/2020 FINAL  Final  Culture, blood (Routine X 2) w Reflex to ID Panel     Status: None   Collection Time: 09/28/20  7:32 PM   Specimen: BLOOD  Result Value Ref Range Status   Specimen Description   Final    BLOOD BLOOD RIGHT ARM Performed at [facility], [address] [place], [place] [address]    Special Requests   Final    Blood Culture results may not be optimal due to an inadequate volume of blood received in culture bottles BOTTLES DRAWN AEROBIC AND ANAEROBIC Performed at [facility], [address] [place], [place] [address]    Culture   Final    NO GROWTH 5 DAYS Performed at [facility], [address] [place], [place] [address]    Report Status 10/03/2020 FINAL  Final  Resp Panel by RT-PCR (Flu A&B, Covid) Nasopharyngeal Swab     Status: None   Collection Time: 09/28/20  8:00 PM   Specimen: Nasopharyngeal Swab; Nasopharyngeal(NP) swabs in vial transport medium  Result Value Ref Range Status   SARS Coronavirus 2 by RT PCR NEGATIVE NEGATIVE Final    Comment: (NOTE) SARS-CoV-2 target nucleic acids are NOT DETECTED.  The SARS-CoV-2 RNA is generally detectable in upper respiratory specimens during the acute phase of infection. The lowest concentration of SARS-CoV-2 viral copies this assay can detect is 138  copies/mL. A negative result does not preclude SARS-Cov-2 infection and should not be used as the sole basis for treatment or other patient management decisions. A negative result may occur with  improper specimen collection/handling, submission of specimen other than nasopharyngeal swab, presence of viral mutation(s) within the areas targeted by this assay, and inadequate number of viral copies(<138 copies/mL). A negative result must be combined with clinical observations, patient history, and epidemiological information. The expected result is Negative.  Fact Sheet for Patients:  BloggerCourse.comhttps://www.fda.gov/media/152166/download  Fact Sheet for Healthcare Providers:  SeriousBroker.ithttps://www.fda.gov/media/152162/download  This test is no t yet approved or cleared by the [place] FDA and  has been authorized for detection and/or diagnosis of SARS-CoV-2 by FDA under an Emergency Use Authorization (EUA). This EUA will remain  in effect (meaning this test can be used) for the duration of the COVID-19 declaration under Section 564(b)(1) of the Act, 21 U.S.C.section 360bbb-3(b)(1), unless the authorization is terminated  or revoked sooner.       Influenza A by PCR NEGATIVE NEGATIVE Final   Influenza B by PCR NEGATIVE NEGATIVE Final    Comment: (NOTE) The Xpert Xpress SARS-CoV-2/FLU/RSV plus assay is intended as an aid in the diagnosis of influenza from Nasopharyngeal swab specimens and should not be used as a sole basis for treatment. Nasal washings and aspirates are unacceptable for Xpert Xpress SARS-CoV-2/FLU/RSV testing.  Fact Sheet for Patients: BloggerCourse.comhttps://www.fda.gov/media/152166/download  Fact Sheet for Healthcare Providers: SeriousBroker.ithttps://www.fda.gov/media/152162/download  This test is not yet approved or cleared by the [place] FDA and has been authorized for detection and/or diagnosis of SARS-CoV-2 by FDA under an Emergency Use Authorization (EUA). This EUA will remain in effect (meaning  this test can be used) for the duration of the COVID-19 declaration under Section 564(b)(1) of the Act, 21 U.S.C. section 360bbb-3(b)(1), unless the authorization is terminated or revoked.  Performed at [facility], [address] [place], [place] [address]         Radiology Studies: No results found.      Scheduled Meds:  calcium acetate  1,334 mg Oral TID WC   Chlorhexidine Gluconate Cloth  6 each Topical Q0600   gabapentin  300 mg Oral QHS   heparin  5,000 Units Subcutaneous Q8H   Vitamin D (Ergocalciferol)  50,000 Units Oral Q7 days   Continuous Infusions:  [START ON 10/06/2020]  ceFAZolin (ANCEF) IV  LOS: 7 days     Jacquelin Hawking, MD [facility] 10/05/2020, 5:32 PM  If 7PM-7AM, please contact night-coverage [email or web address]

## 2020-10-05 NOTE — Progress Notes (Signed)
Inpatient Rehabilitation Admissions Coordinator   Noted therapy has changed their recommendation to outpatient rehab. Not in need of a CIR admit at this time. We will sign off.  Ottie Glazier, RN, MSN Rehab Admissions Coordinator [phone] 10/05/2020 7:09 PM

## 2020-10-05 NOTE — Progress Notes (Signed)
OT Cancellation Note  Patient Details Name: Brett Butler MRN: [id number] DOB: 15-Feb-1965   Cancelled Treatment:    Reason Eval/Treat Not Completed: Patient at procedure or test/ unavailable (HD)  Mateo Flow 10/05/2020, 7:36 AM  Timmothy Euler, OTR/L  Acute Rehabilitation Services Pager: [phone] Office: [phone] .

## 2020-10-05 NOTE — Progress Notes (Addendum)
Out Patient Arrangements:  Possible d/c barriers: Need Hep B Surface Antigen results Will need a CXR Finalized d/c plans - home vs rehab - this may impact HD location.  Have been requested to arrange out pt HD for pt. Have submitted medical records to [facility] admissions. Please advise of d/c date.   Andree Elk HPSS [phone]

## 2020-10-05 NOTE — Progress Notes (Signed)
   10/05/20 1035  Vitals  Temp (!) 97.5 F (36.4 C)  Temp Source Oral  BP 140/84  BP Location Left Arm  BP Method Automatic  Patient Position (if appropriate) Lying  Pulse Rate 93  Pulse Rate Source Monitor  Resp 14  Oxygen Therapy  SpO2 92 %  O2 Device Room Air  Post-Hemodialysis Assessment  Rinseback Volume (mL) 250 mL  KECN 209 V  Dialyzer Clearance Lightly streaked  Duration of HD Treatment -hour(s) 3 hour(s)  Hemodialysis Intake (mL) 500 mL  UF Total -Machine (mL) 3000 mL  Net UF (mL) 2500 mL  Tolerated HD Treatment Yes  Post-Hemodialysis Comments tx complete-pt stable  CVC Triple Lumen 09/30/20 Right Internal jugular 15 cm  Placement Date/Time: 09/30/20 1103   Time Out: Correct patient;Correct site;Correct procedure  Maximum sterile barrier precautions: Hand hygiene;Cap;Mask  Site Prep: Chlorhexidine (preferred)  Local Anesthetic: Injectable - 1% Lidocaine  Serial / Lot ...  Site Assessment Clean;Intact;Dry  Proximal Lumen Status Heparin locked  Medial Lumen Status Dead end cap in place  Distal Lumen Status Heparin locked  Dressing Type Transparent  Dressing Status Clean;Dry;Intact  Antimicrobial disc in place? Yes  Dressing Intervention New dressing;Dressing changed;Antimicrobial disc changed  Dressing Change Due 10/12/20  HD tx complete-pt stable.

## 2020-10-05 NOTE — Progress Notes (Signed)
[facility] NEPHROLOGY PROGRESS NOTE  Assessment/ Plan:  # Acute kidney injury: This is secondary to pigment nephropathy/rhabdomyolysis.  Anuric and with dense renal injury.  First HD 8/19, then #2 on 8/20. Today is #3.  Need to look into outpt AKI spot, will begin process.  Consult IR for [facility]    # Hyperkalemia: Secondary to rhabdomyolysis and associated acute kidney injury.  Managed with HD.  #  Rhabdomyolysis, nontraumatic: Secondary to prolonged dependent posturing of the left lower extremity following drug use.  Starting dialysis as he developed AKI. Ortho following  # Polysubstance abuse: Educated about the need for cessation/abstinence.   #Hypocalcemia and HyperPhos, related to Rhabdo: Start CaAcetate binder.   Subjective:  Seen on HD: 2K, Qb 300, UF goal 3L.  UsingTemp HD cath.   NO sig inc in UOP Change in SCr/BUN reflects very low / absent GFR BPs stable, AF, on RA CK coming down  Objective Vital signs in last 24 hours: Vitals:   10/05/20 0728 10/05/20 0730 10/05/20 0745 10/05/20 0800  BP: (!) 143/85 (!) 144/86 (!) 142/90 140/84  Pulse: 84 84 88 97  Resp: 18 17 11 10   Temp:      TempSrc:      SpO2:      Weight:      Height:       Weight change: 0 kg  Intake/Output Summary (Last 24 hours) at 10/05/2020 0821 Last data filed at 10/04/2020 1613 Gross per 24 hour  Intake 490 ml  Output 200 ml  Net 290 ml        Labs: Basic Metabolic Panel: Recent Labs  Lab 10/03/20 0345 10/04/20 0307 10/05/20 0328  NA 133* 130* 126*  K 4.0 4.7 5.7*  CL 96* 94* 92*  CO2 27 23 19*  GLUCOSE 155* 96 83  BUN 27* 52* 85*  CREATININE 5.49* 8.47* 11.15*  CALCIUM 7.6* 7.5* 7.5*  PHOS 5.3* 6.6* 7.5*    Liver Function Tests: Recent Labs  Lab 10/03/20 0345 10/04/20 0307 10/05/20 0328  AST 606* 448* 310*  ALT 351* 293* 241*  ALKPHOS 43 44 41  BILITOT 0.7 0.6 0.5  PROT 5.3* 5.0* 4.9*  ALBUMIN 2.2*  2.1* 2.1* 2.1*  2.1*    No results for input(s):  LIPASE, AMYLASE in the last 168 hours. No results for input(s): AMMONIA in the last 168 hours. CBC: Recent Labs  Lab 09/28/20 1617 09/29/20 0406 09/30/20 0220 10/02/20 0703 10/03/20 0345 10/05/20 0730  WBC 27.3* 24.0* 14.8* 9.0 9.1 12.9*  NEUTROABS 23.6* 20.9*  --   --   --   --   HGB 18.3* 14.1 11.1* 10.9* 11.3* 10.4*  HCT 57.4* 40.8 30.9* 31.5* 32.9* 29.7*  MCV 99.1 92.7 89.0 90.5 91.9 91.4  PLT 316 213 181 146* 135* 201    Cardiac Enzymes: Recent Labs  Lab 10/01/20 0432 10/02/20 0703 10/03/20 0345 10/04/20 0307 10/05/20 0328  CKTOTAL >50,000* 31,903* >50,000* 42,595* 26,694*    CBG: No results for input(s): GLUCAP in the last 168 hours.  Iron Studies: No results for input(s): IRON, TIBC, TRANSFERRIN, FERRITIN in the last 72 hours. Studies/Results: No results found.  Medications: Infusions:    Scheduled Medications:  calcium carbonate  400 mg of elemental calcium Oral BID   Chlorhexidine Gluconate Cloth  6 each Topical Q0600   gabapentin  300 mg Oral QHS   heparin  5,000 Units Subcutaneous Q8H   Vitamin D (Ergocalciferol)  50,000 Units Oral Q7 days  have reviewed scheduled and prn medications.  Physical Exam: General:NAD, comfortable, lying flat Heart:RRR, s1s2 nl Lungs:clear b/l, no crackle Abdomen:soft, Non-tender, non-distended Extremities: Lower extremities edema is much better. Dialysis Access: Right IJ temporary HD catheter placed by IR on 8/18  Jeff Frieden B Quanasia Defino 10/05/2020,8:21 AM  LOS: 7 days

## 2020-10-06 DIAGNOSIS — N170 Acute kidney failure with tubular necrosis: Secondary | ICD-10-CM

## 2020-10-06 LAB — CBC WITH DIFFERENTIAL/PLATELET
Abs Immature Granulocytes: 0.24 10*3/uL — ABNORMAL HIGH (ref 0.00–0.07)
Basophils Absolute: 0.1 10*3/uL (ref 0.0–0.1)
Basophils Relative: 1 %
Eosinophils Absolute: 0.7 10*3/uL — ABNORMAL HIGH (ref 0.0–0.5)
Eosinophils Relative: 6 %
HCT: 27.1 % — ABNORMAL LOW (ref 39.0–52.0)
Hemoglobin: 9.7 g/dL — ABNORMAL LOW (ref 13.0–17.0)
Immature Granulocytes: 2 %
Lymphocytes Relative: 17 %
Lymphs Abs: 1.9 10*3/uL (ref 0.7–4.0)
MCH: 31.7 pg (ref 26.0–34.0)
MCHC: 35.8 g/dL (ref 30.0–36.0)
MCV: 88.6 fL (ref 80.0–100.0)
Monocytes Absolute: 1.5 10*3/uL — ABNORMAL HIGH (ref 0.1–1.0)
Monocytes Relative: 13 %
Neutro Abs: 7.2 10*3/uL (ref 1.7–7.7)
Neutrophils Relative %: 61 %
Platelets: 189 10*3/uL (ref 150–400)
RBC: 3.06 MIL/uL — ABNORMAL LOW (ref 4.22–5.81)
RDW: 13 % (ref 11.5–15.5)
WBC: 11.7 10*3/uL — ABNORMAL HIGH (ref 4.0–10.5)
nRBC: 0 % (ref 0.0–0.2)

## 2020-10-06 LAB — RENAL FUNCTION PANEL
Albumin: 2.2 g/dL — ABNORMAL LOW (ref 3.5–5.0)
Anion gap: 11 (ref 5–15)
BUN: 70 mg/dL — ABNORMAL HIGH (ref 6–20)
CO2: 24 mmol/L (ref 22–32)
Calcium: 7.8 mg/dL — ABNORMAL LOW (ref 8.9–10.3)
Chloride: 93 mmol/L — ABNORMAL LOW (ref 98–111)
Creatinine, Ser: 9.49 mg/dL — ABNORMAL HIGH (ref 0.61–1.24)
GFR, Estimated: 6 mL/min — ABNORMAL LOW (ref >60)
Glucose, Bld: 95 mg/dL (ref 70–99)
Phosphorus: 6.7 mg/dL — ABNORMAL HIGH (ref 2.5–4.6)
Potassium: 5.3 mmol/L — ABNORMAL HIGH (ref 3.5–5.1)
Sodium: 128 mmol/L — ABNORMAL LOW (ref 135–145)

## 2020-10-06 LAB — HEPATIC FUNCTION PANEL
ALT: 207 U/L — ABNORMAL HIGH (ref 0–44)
AST: 259 U/L — ABNORMAL HIGH (ref 15–41)
Albumin: 2.2 g/dL — ABNORMAL LOW (ref 3.5–5.0)
Alkaline Phosphatase: 36 U/L — ABNORMAL LOW (ref 38–126)
Bilirubin, Direct: 0.2 mg/dL (ref 0.0–0.2)
Indirect Bilirubin: 0.5 mg/dL (ref 0.3–0.9)
Total Bilirubin: 0.7 mg/dL (ref 0.3–1.2)
Total Protein: 4.8 g/dL — ABNORMAL LOW (ref 6.5–8.1)

## 2020-10-06 LAB — CK: Total CK: 17670 U/L — ABNORMAL HIGH (ref 49–397)

## 2020-10-06 NOTE — TOC Initial Note (Signed)
Transition of Care [facility]) - Initial/Assessment Note    Patient Details  Name: Brett Butler MRN: [id number] Date of Birth: 02-19-1965  Transition of Care [facility]) CM/SW Contact:    Lawerance Sabal, RN Phone Number: 10/06/2020, 11:02 AM  Clinical Narrative:            Patient awaiting clip for HD, uninsured. Current recommendations are for OP therapies.  Discussed with patient, he is agreeable to [facility]. States he will be able to take the bus. Referral not made at this time, notes show he is walking 30 feet, and may be more appropriate for home health if charity provider able to accept at time of DC. Discussed DME, he would like a rollator, will need order and referral closer to DC.        Expected Discharge Plan: Home/Self Care Barriers to Discharge: Continued Medical Work up   Patient Goals and CMS Choice        Expected Discharge Plan and Services Expected Discharge Plan: Home/Self Care   Discharge Planning Services: CM Consult   Living arrangements for the past 2 months: Single Family Home                                      Prior Living Arrangements/Services Living arrangements for the past 2 months: Single Family Home Lives with:: Relatives (Mother sister and brother in Social worker)                   Activities of Daily Living Home Assistive Devices/Equipment: None ADL Screening (condition at time of admission) Patient's cognitive ability adequate to safely complete daily activities?: No Is the patient deaf or have difficulty hearing?: No Does the patient have difficulty seeing, even when wearing glasses/contacts?: No Does the patient have difficulty concentrating, remembering, or making decisions?: No Patient able to express need for assistance with ADLs?: Yes Does the patient have difficulty dressing or bathing?: Yes Independently performs ADLs?: Yes (appropriate for developmental age) Does the patient have difficulty walking or climbing  stairs?: Yes Weakness of Legs: Both Weakness of Arms/Hands: None  Permission Sought/Granted                  Emotional Assessment              Admission diagnosis:  ARF (acute renal failure) (HCC) [N17.9] Traumatic rhabdomyolysis, initial encounter (HCC) [T79.6XXA] Patient Active Problem List   Diagnosis Date Noted   Rhabdomyolysis 09/29/2020   Cocaine abuse (HCC) 09/29/2020   Hyperkalemia 09/29/2020   Leukocytosis 09/29/2020   Injury of left sciatic nerve    ARF (acute renal failure) (HCC) 09/28/2020   Schizoaffective disorder, depressive type (HCC) 09/10/2019   Opioid use disorder, moderate, dependence (HCC) 09/10/2019   Cannabis use disorder, moderate, dependence (HCC) 09/10/2019   PTSD (post-traumatic stress disorder) 09/10/2019   PCP:  Lavinia Sharps, NP Pharmacy:   [id number] [place], [place] - [address] AT [facility] [address] [place] [address] Phone: [phone] Fax: [phone]  [facility] [address] [place] [address] Phone: [phone] Fax: [phone]     Social Determinants of Health (SDOH) Interventions    Readmission Risk Interventions No flowsheet data found.

## 2020-10-06 NOTE — Progress Notes (Signed)
[facility] PROGRESS NOTE    Progress Note  Brett Butler  IRC:[id number] DOB: February 03, 1966 DOA: 09/28/2020 PCP: Lavinia Sharps, NP     Brief Narrative:   Brett Butler is an 55 y.o. male past medical history of polysubstance abuse opiates, cocaine, cannabis, schizoaffective disorder found to be lethargic by his friend responded to Narcan by EMS.  In the ED was found to be in severe acute kidney injury and severe rhabdo that required HD   Assessment/Plan:   Acute kidney injury likely due to pigment induced injury: On admission for started on IV fluids aggressively with no improvement nephrology was consulted, who recommended dialysis. Temporary catheter was placed on 09/30/2020 started HD he has required several hemodialysis treatment. To start looking for outpatient acute kidney injury spot. IR consulted for temporary dialysis catheter. He continues to be anuric.  Hyperkalemia: Likely due to rhabdomyolysis associated acute kidney injury improved with HD.  Nontraumatic rhabdomyolysis: Secondary to prolonged dependent posturing following drug use. Imaging was consistent with rhabdomyolysis. Orthopedic surgery was consulted recommended conservative management. CK continues to improve with dialysis.  Left leg weakness/sciatic neuralgic pain: Sign in the setting of prolonged time down. Physical therapy evaluated the patient and recommended CIR. Continue gabapentin.  Hyponatremia: Continue further management per renal.  Hypocalcemia: Corrected calcium appears to be around 9. PTH is elevated with a low vitamin D. He was started on calcium and vitamin D replacement therapy.  Elevated troponin/demand ischemia: In setting of rhabdomyolysis denies any chest pain or shortness of breath.  Cocaine abuse: Counseling.  Leukocytosis: Likely reactive due to rhabdomyolysis.  Blood cultures remain negative till date. Active Problems:    DVT prophylaxis: lovenox Family  Communication:neon Status is: Inpatient  Remains inpatient appropriate because:Hemodynamically unstable  Dispo: The patient is from: Home              Anticipated d/c is to: Home              Patient currently is not medically stable to d/c.   Difficult to place patient No        Code Status:     Code Status Orders  (From admission, onward)           Start     Ordered   09/29/20 0015  Full code  Continuous        09/29/20 0019           Code Status History     This patient has a current code status but no historical code status.         IV Access:   Peripheral IV   Procedures and diagnostic studies:   No results found.   Medical Consultants:   None.   Subjective:    Capri Raben relates his leg hurts  Objective:    Vitals:   10/05/20 2325 10/06/20 0335 10/06/20 0500 10/06/20 0753  BP: 133/82 (!) 146/75  (!) 141/86  Pulse: 83 83  89  Resp: 20 15  16   Temp: 98.9 F (37.2 C) 98.9 F (37.2 C)  98.6 F (37 C)  TempSrc: Oral Oral  Oral  SpO2: 93% 91%  93%  Weight:   72.6 kg   Height:       SpO2: 93 %   Intake/Output Summary (Last 24 hours) at 10/06/2020 0854 Last data filed at 10/06/2020 0338 Gross per 24 hour  Intake 836 ml  Output 2600 ml  Net -1764 ml   Filed Weights   10/05/20 10/07/20  10/05/20 1047 10/06/20 0500  Weight: 73.2 kg 72.6 kg 72.6 kg    Exam: General exam: In no acute distress. Respiratory system: Good air movement and clear to auscultation. Cardiovascular system: S1 & S2 heard, RRR. No JVD. Gastrointestinal system: Abdomen is nondistended, soft and nontender.  Extremities: No pedal edema. Skin: No rashes, lesions or ulcers Psychiatry: Judgement and insight appear normal. Mood & affect appropriate.    Data Reviewed:    Labs: Basic Metabolic Panel: Recent Labs  Lab 10/02/20 0703 10/03/20 0345 10/04/20 0307 10/05/20 0328 10/06/20 0704  NA 130* 133* 130* 126* 128*  K 4.2 4.0 4.7 5.7* 5.3*  CL  95* 96* 94* 92* 93*  CO2 23 27 23  19* 24  GLUCOSE 107* 155* 96 83 95  BUN 38* 27* 52* 85* 70*  CREATININE 6.57* 5.49* 8.47* 11.15* 9.49*  CALCIUM 7.2* 7.6* 7.5* 7.5* 7.8*  PHOS 5.2* 5.3* 6.6* 7.5* 6.7*   GFR Estimated Creatinine Clearance: 8.4 mL/min (A) (by C-G formula based on SCr of 9.49 mg/dL (H)). Liver Function Tests: Recent Labs  Lab 10/02/20 0703 10/03/20 0345 10/04/20 0307 10/05/20 0328 10/06/20 0704  AST 776* 606* 448* 310* 259*  ALT 373* 351* 293* 241* 207*  ALKPHOS 44 43 44 41 36*  BILITOT 0.7 0.7 0.6 0.5 0.7  PROT 5.4* 5.3* 5.0* 4.9* 4.8*  ALBUMIN 2.2*  2.3* 2.2*  2.1* 2.1* 2.1*  2.1* 2.2*  2.2*   No results for input(s): LIPASE, AMYLASE in the last 168 hours. No results for input(s): AMMONIA in the last 168 hours. Coagulation profile No results for input(s): INR, PROTIME in the last 168 hours. COVID-19 Labs  No results for input(s): DDIMER, FERRITIN, LDH, CRP in the last 72 hours.  Lab Results  Component Value Date   SARSCOV2NAA NEGATIVE 09/28/2020    CBC: Recent Labs  Lab 09/30/20 0220 10/02/20 0703 10/03/20 0345 10/05/20 0730  WBC 14.8* 9.0 9.1 12.9*  HGB 11.1* 10.9* 11.3* 10.4*  HCT 30.9* 31.5* 32.9* 29.7*  MCV 89.0 90.5 91.9 91.4  PLT 181 146* 135* 201   Cardiac Enzymes: Recent Labs  Lab 10/02/20 0703 10/03/20 0345 10/04/20 0307 10/05/20 0328 10/06/20 0704  CKTOTAL 31,903* >50,000* 42,595* 10/08/20* 17,670*   BNP (last 3 results) No results for input(s): PROBNP in the last 8760 hours. CBG: No results for input(s): GLUCAP in the last 168 hours. D-Dimer: No results for input(s): DDIMER in the last 72 hours. Hgb A1c: No results for input(s): HGBA1C in the last 72 hours. Lipid Profile: No results for input(s): CHOL, HDL, LDLCALC, TRIG, CHOLHDL, LDLDIRECT in the last 72 hours. Thyroid function studies: No results for input(s): TSH, T4TOTAL, T3FREE, THYROIDAB in the last 72 hours.  Invalid input(s): FREET3 Anemia work up: No  results for input(s): VITAMINB12, FOLATE, FERRITIN, TIBC, IRON, RETICCTPCT in the last 72 hours. Sepsis Labs: Recent Labs  Lab 09/30/20 0220 10/02/20 0703 10/03/20 0345 10/05/20 0730  WBC 14.8* 9.0 9.1 12.9*   Microbiology Recent Results (from the past 240 hour(s))  Culture, blood (Routine X 2) w Reflex to ID Panel     Status: None   Collection Time: 09/28/20  7:27 PM   Specimen: BLOOD  Result Value Ref Range Status   Specimen Description   Final    BLOOD RIGHT ANTECUBITAL Performed at [facility], [address] [place], [place]    Special Requests   Final    Blood Culture results may not be optimal due to an inadequate volume  of blood received in culture bottles BOTTLES DRAWN AEROBIC AND ANAEROBIC Performed at [facility], [address] [place], [place] [address]    Culture   Final    NO GROWTH 5 DAYS Performed at [facility], [address] [place], [place] [address]    Report Status 10/03/2020 FINAL  Final  Culture, blood (Routine X 2) w Reflex to ID Panel     Status: None   Collection Time: 09/28/20  7:32 PM   Specimen: BLOOD  Result Value Ref Range Status   Specimen Description   Final    BLOOD BLOOD RIGHT ARM Performed at [facility], [address] [place], [place] [address]    Special Requests   Final    Blood Culture results may not be optimal due to an inadequate volume of blood received in culture bottles BOTTLES DRAWN AEROBIC AND ANAEROBIC Performed at [facility], [address] [place], [place] [address]    Culture   Final    NO GROWTH 5 DAYS Performed at [facility], [address] [place], [place] [address]    Report Status 10/03/2020 FINAL  Final  Resp Panel by RT-PCR (Flu A&B, Covid) Nasopharyngeal Swab     Status: None   Collection Time: 09/28/20  8:00 PM   Specimen: Nasopharyngeal Swab; Nasopharyngeal(NP) swabs in vial transport medium   Result Value Ref Range Status   SARS Coronavirus 2 by RT PCR NEGATIVE NEGATIVE Final    Comment: (NOTE) SARS-CoV-2 target nucleic acids are NOT DETECTED.  The SARS-CoV-2 RNA is generally detectable in upper respiratory specimens during the acute phase of infection. The lowest concentration of SARS-CoV-2 viral copies this assay can detect is 138 copies/mL. A negative result does not preclude SARS-Cov-2 infection and should not be used as the sole basis for treatment or other patient management decisions. A negative result may occur with  improper specimen collection/handling, submission of specimen other than nasopharyngeal swab, presence of viral mutation(s) within the areas targeted by this assay, and inadequate number of viral copies(<138 copies/mL). A negative result must be combined with clinical observations, patient history, and epidemiological information. The expected result is Negative.  Fact Sheet for Patients:  BloggerCourse.comhttps://www.fda.gov/media/152166/download  Fact Sheet for Healthcare Providers:  SeriousBroker.ithttps://www.fda.gov/media/152162/download  This test is no t yet approved or cleared by the [place] FDA and  has been authorized for detection and/or diagnosis of SARS-CoV-2 by FDA under an Emergency Use Authorization (EUA). This EUA will remain  in effect (meaning this test can be used) for the duration of the COVID-19 declaration under Section 564(b)(1) of the Act, 21 U.S.C.section 360bbb-3(b)(1), unless the authorization is terminated  or revoked sooner.       Influenza A by PCR NEGATIVE NEGATIVE Final   Influenza B by PCR NEGATIVE NEGATIVE Final    Comment: (NOTE) The Xpert Xpress SARS-CoV-2/FLU/RSV plus assay is intended as an aid in the diagnosis of influenza from Nasopharyngeal swab specimens and should not be used as a sole basis for treatment. Nasal washings and aspirates are unacceptable for Xpert Xpress SARS-CoV-2/FLU/RSV testing.  Fact Sheet for  Patients: BloggerCourse.comhttps://www.fda.gov/media/152166/download  Fact Sheet for Healthcare Providers: SeriousBroker.ithttps://www.fda.gov/media/152162/download  This test is not yet approved or cleared by the [place] FDA and has been authorized for detection and/or diagnosis of SARS-CoV-2 by FDA under an Emergency Use Authorization (EUA). This EUA will remain in effect (meaning this test can be used) for the duration of the COVID-19 declaration  under Section 564(b)(1) of the Act, 21 U.S.C. section 360bbb-3(b)(1), unless the authorization is terminated or revoked.  Performed at [facility], [address], [place], [place] [address]      Medications:    calcium acetate  1,334 mg Oral TID WC   Chlorhexidine Gluconate Cloth  6 each Topical Q0600   docusate sodium  100 mg Oral BID   gabapentin  300 mg Oral QHS   heparin  5,000 Units Subcutaneous Q8H   polyethylene glycol  17 g Oral Daily   Vitamin D (Ergocalciferol)  50,000 Units Oral Q7 days   Continuous Infusions:   ceFAZolin (ANCEF) IV        LOS: 8 days   Marinda Elk  [facility]  10/06/2020, 8:54 AM

## 2020-10-06 NOTE — Progress Notes (Signed)
[facility] NEPHROLOGY PROGRESS NOTE  Assessment/ Plan:  #Anuric dialysis dependent AKI secondary to rhabdomyolysis: A Started HD 819, has completed 3 treatments  Continue on THS schedule : 2K 3L UF, HD cath, no hepairn TDC has been requested  Outpatient AKI clip initiated   # Hyperkalemia: Continue to monitor, currently managed with HD  #HTN/volume: Weights fairly stable, blood pressures acceptable.  #  Rhabdomyolysis, nontraumatic: Secondary to prolonged dependent posturing of the left lower extremity following drug use.  Starting dialysis as he developed AKI. Ortho following  # Polysubstance abuse:    #Hypocalcemia and HyperPhos, related to Rhabdo: Continue CaAcetate binder.   Subjective:  No interval events CK elevated but continues to improve HD yesterday with 2.5 L UF; only 0.1 L UOP reported Change in labs not consistent with recovering GFR IR following for [facility] placement  Objective Vital signs in last 24 hours: Vitals:   10/06/20 0335 10/06/20 0500 10/06/20 0753 10/06/20 1124  BP: (!) 146/75  (!) 141/86 (!) 151/83  Pulse: 83  89 84  Resp: 15  16 12   Temp: 98.9 F (37.2 C)  98.6 F (37 C) 98.2 F (36.8 C)  TempSrc: Oral  Oral Oral  SpO2: 91%  93% 95%  Weight:  72.6 kg    Height:       Weight change: 0 kg  Intake/Output Summary (Last 24 hours) at 10/06/2020 1224 Last data filed at 10/06/2020 10/08/2020 Gross per 24 hour  Intake 360 ml  Output 100 ml  Net 260 ml        Labs: Basic Metabolic Panel: Recent Labs  Lab 10/04/20 0307 10/05/20 0328 10/06/20 0704  NA 130* 126* 128*  K 4.7 5.7* 5.3*  CL 94* 92* 93*  CO2 23 19* 24  GLUCOSE 96 83 95  BUN 52* 85* 70*  CREATININE 8.47* 11.15* 9.49*  CALCIUM 7.5* 7.5* 7.8*  PHOS 6.6* 7.5* 6.7*    Liver Function Tests: Recent Labs  Lab 10/04/20 0307 10/05/20 0328 10/06/20 0704  AST 448* 310* 259*  ALT 293* 241* 207*  ALKPHOS 44 41 36*  BILITOT 0.6 0.5 0.7  PROT 5.0* 4.9* 4.8*  ALBUMIN  2.1* 2.1*  2.1* 2.2*  2.2*    No results for input(s): LIPASE, AMYLASE in the last 168 hours. No results for input(s): AMMONIA in the last 168 hours. CBC: Recent Labs  Lab 09/30/20 0220 10/02/20 0703 10/03/20 0345 10/05/20 0730  WBC 14.8* 9.0 9.1 12.9*  HGB 11.1* 10.9* 11.3* 10.4*  HCT 30.9* 31.5* 32.9* 29.7*  MCV 89.0 90.5 91.9 91.4  PLT 181 146* 135* 201    Cardiac Enzymes: Recent Labs  Lab 10/02/20 0703 10/03/20 0345 10/04/20 0307 10/05/20 0328 10/06/20 0704  CKTOTAL 31,903* >50,000* 42,595* 10/08/20* 17,670*    CBG: No results for input(s): GLUCAP in the last 168 hours.  Iron Studies: No results for input(s): IRON, TIBC, TRANSFERRIN, FERRITIN in the last 72 hours. Studies/Results: No results found.  Medications: Infusions:   ceFAZolin (ANCEF) IV       Scheduled Medications:  calcium acetate  1,334 mg Oral TID WC   Chlorhexidine Gluconate Cloth  6 each Topical Q0600   docusate sodium  100 mg Oral BID   gabapentin  300 mg Oral QHS   heparin  5,000 Units Subcutaneous Q8H   polyethylene glycol  17 g Oral Daily   Vitamin D (Ergocalciferol)  50,000 Units Oral Q7 days    have reviewed scheduled and prn medications.  Physical Exam: General:NAD,  comfortable, lying flat Heart:RRR, s1s2 nl Lungs:clear Butler/l, no crackles Abdomen:soft, Non-tender, non-distended Extremities: Lower extremities edema is much better. Dialysis Access: Right IJ temporary HD catheter placed by IR on 8/18  Brett Butler Brett Butler 10/06/2020,12:24 PM  LOS: 8 days

## 2020-10-07 ENCOUNTER — Inpatient Hospital Stay (HOSPITAL_COMMUNITY): Payer: Medicaid Other

## 2020-10-07 HISTORY — PX: IR FLUORO GUIDE CV LINE RIGHT: IMG2283

## 2020-10-07 HISTORY — PX: IR US GUIDE VASC ACCESS RIGHT: IMG2390

## 2020-10-07 LAB — RENAL FUNCTION PANEL
Albumin: 2.1 g/dL — ABNORMAL LOW (ref 3.5–5.0)
Anion gap: 14 (ref 5–15)
BUN: 94 mg/dL — ABNORMAL HIGH (ref 6–20)
CO2: 23 mmol/L (ref 22–32)
Calcium: 7.9 mg/dL — ABNORMAL LOW (ref 8.9–10.3)
Chloride: 90 mmol/L — ABNORMAL LOW (ref 98–111)
Creatinine, Ser: 11.29 mg/dL — ABNORMAL HIGH (ref 0.61–1.24)
GFR, Estimated: 5 mL/min — ABNORMAL LOW (ref >60)
Glucose, Bld: 112 mg/dL — ABNORMAL HIGH (ref 70–99)
Phosphorus: 8.1 mg/dL — ABNORMAL HIGH (ref 2.5–4.6)
Potassium: 5.7 mmol/L — ABNORMAL HIGH (ref 3.5–5.1)
Sodium: 127 mmol/L — ABNORMAL LOW (ref 135–145)

## 2020-10-07 LAB — HEPATIC FUNCTION PANEL
ALT: 176 U/L — ABNORMAL HIGH (ref 0–44)
AST: 194 U/L — ABNORMAL HIGH (ref 15–41)
Albumin: 2.1 g/dL — ABNORMAL LOW (ref 3.5–5.0)
Alkaline Phosphatase: 40 U/L (ref 38–126)
Bilirubin, Direct: 0.2 mg/dL (ref 0.0–0.2)
Indirect Bilirubin: 0.5 mg/dL (ref 0.3–0.9)
Total Bilirubin: 0.7 mg/dL (ref 0.3–1.2)
Total Protein: 4.6 g/dL — ABNORMAL LOW (ref 6.5–8.1)

## 2020-10-07 LAB — CK: Total CK: 11223 U/L — ABNORMAL HIGH (ref 49–397)

## 2020-10-07 MED ORDER — SODIUM ZIRCONIUM CYCLOSILICATE 10 G PO PACK
10.0000 g | PACK | Freq: Every day | ORAL | Status: DC
  Administered 2020-10-07 – 2020-10-12 (×5): 10 g via ORAL
  Filled 2020-10-07 (×7): qty 1

## 2020-10-07 MED ORDER — CHLORHEXIDINE GLUCONATE 4 % EX LIQD
CUTANEOUS | Status: AC
  Filled 2020-10-07: qty 15

## 2020-10-07 MED ORDER — HEPARIN SODIUM (PORCINE) 1000 UNIT/ML IJ SOLN
INTRAMUSCULAR | Status: AC
  Filled 2020-10-07: qty 1

## 2020-10-07 MED ORDER — GELATIN ABSORBABLE 12-7 MM EX MISC
CUTANEOUS | Status: AC
  Filled 2020-10-07: qty 1

## 2020-10-07 MED ORDER — MIDAZOLAM HCL 2 MG/2ML IJ SOLN
INTRAMUSCULAR | Status: AC
  Filled 2020-10-07: qty 2

## 2020-10-07 MED ORDER — MIDAZOLAM HCL 2 MG/2ML IJ SOLN
INTRAMUSCULAR | Status: AC | PRN
  Administered 2020-10-07: 1 mg via INTRAVENOUS

## 2020-10-07 MED ORDER — LIDOCAINE HCL 1 % IJ SOLN
INTRAMUSCULAR | Status: AC
  Filled 2020-10-07: qty 20

## 2020-10-07 MED ORDER — LIDOCAINE HCL 1 % IJ SOLN
INTRAMUSCULAR | Status: AC | PRN
  Administered 2020-10-07: 15 mL

## 2020-10-07 MED ORDER — FENTANYL CITRATE (PF) 100 MCG/2ML IJ SOLN
INTRAMUSCULAR | Status: AC
  Filled 2020-10-07: qty 2

## 2020-10-07 MED ORDER — FENTANYL CITRATE (PF) 100 MCG/2ML IJ SOLN
INTRAMUSCULAR | Status: AC | PRN
  Administered 2020-10-07: 25 ug via INTRAVENOUS

## 2020-10-07 MED ORDER — CEFAZOLIN SODIUM-DEXTROSE 2-4 GM/100ML-% IV SOLN
INTRAVENOUS | Status: AC
  Administered 2020-10-07: 2 g via INTRAVENOUS
  Filled 2020-10-07: qty 100

## 2020-10-07 NOTE — Progress Notes (Signed)
PT Cancellation Note  Patient Details Name: Muzammil Bruins MRN: [id number] DOB: [birth year]   Cancelled Treatment:    Reason Eval/Treat Not Completed: Patient at procedure or test/unavailable at this time. PT attempted x3, pt at procedure (IR) then HD. Will continue to attempt and progress established POC tomorrow.   Lazarus Gowda, PT, DPT   Acute Rehabilitation Department Pager #: [phone]   Ronnie Derby 10/07/2020, 6:27 PM

## 2020-10-07 NOTE — Procedures (Addendum)
Interventional Radiology Procedure:   Indications: AKI and needs long term HD catheter access  Procedure: Remove of non-tunneled HD catheter and placement of new tunneled HD catheter  Findings: New right IJ Palindrome, 19 cm tip to cuff, tip at SVC/RA junction  Complications: No immediate complications noted.     EBL: Minimal  Plan: Catheter is ready for use.    Emmalyne Giacomo R. Lowella Dandy, MD  Pager: [phone]

## 2020-10-07 NOTE — Progress Notes (Signed)
[facility] PROGRESS NOTE    Progress Note  Brett Butler  XHB:[id number] DOB: May 20, 1965 DOA: 09/28/2020 PCP: Lavinia Sharps, NP     Brief Narrative:   Brett Butler is an 55 y.o. male past medical history of polysubstance abuse opiates, cocaine, cannabis, schizoaffective disorder found to be lethargic by his friend responded to Narcan by EMS.  In the ED was found to be in severe acute kidney injury and severe rhabdo that required HD   Assessment/Plan:   Acute kidney injury likely due to pigment induced injury: Did not respond to IV fluids nephrology was consulted as well as IR. Temporary catheter was placed on 09/30/2020 started HD he has required several hemodialysis treatment. Navigator notified to start looking for a place for acute kidney injury as an outpatient. HD catheter placed on 10/01/2018 He continues to be anuric  Hyperkalemia: Likely due to rhabdomyolysis associated acute kidney injury improved with HD.  Nontraumatic rhabdomyolysis: Secondary to prolonged dependent posturing following drug use. Imaging was consistent with rhabdomyolysis. Orthopedic surgery was consulted recommended conservative management. CK continues to improve with dialysis.  Left leg weakness/sciatic neuralgic pain: Sign in the setting of prolonged time down. Physical therapy evaluated the patient and recommended CIR. Continue gabapentin.  Hyponatremia: Continue further management per renal.  Hypocalcemia: Corrected calcium appears to be around 9. PTH is elevated with a low vitamin D. He was started on calcium and vitamin D replacement therapy.  Elevated troponin/demand ischemia: In setting of rhabdomyolysis denies any chest pain or shortness of breath.  Cocaine abuse: Counseling.  Leukocytosis: Likely reactive due to rhabdomyolysis.  Blood cultures remain negative till date. Active Problems:    DVT prophylaxis: lovenox Family Communication:neon Status is:  Inpatient  Remains inpatient appropriate because:Hemodynamically unstable  Dispo: The patient is from: Home              Anticipated d/c is to: Home              Patient currently is not medically stable to d/c.   Difficult to place patient No        Code Status:     Code Status Orders  (From admission, onward)           Start     Ordered   09/29/20 0015  Full code  Continuous        09/29/20 0019           Code Status History     This patient has a current code status but no historical code status.         IV Access:   Peripheral IV   Procedures and diagnostic studies:   No results found.   Medical Consultants:   None.   Subjective:    Brett Butler pain is controlled.  Objective:    Vitals:   10/07/20 0307 10/07/20 0309 10/07/20 0802 10/07/20 0840  BP: (!) 148/77  137/71 (!) 159/87  Pulse: 90  87 79  Resp: 11  13 11   Temp: 98.8 F (37.1 C)  97.9 F (36.6 C)   TempSrc: Oral  Oral   SpO2: 93%  93% 95%  Weight:  72.5 kg    Height:       SpO2: 95 %   Intake/Output Summary (Last 24 hours) at 10/07/2020 0845 Last data filed at 10/07/2020 0803 Gross per 24 hour  Intake 765 ml  Output 200 ml  Net 565 ml    Filed Weights   10/05/20 1047 10/06/20 0500  10/07/20 0309  Weight: 72.6 kg 72.6 kg 72.5 kg    Exam: General exam: In no acute distress. Respiratory system: Good air movement and clear to auscultation. Cardiovascular system: S1 & S2 heard, RRR. No JVD. Gastrointestinal system: Abdomen is nondistended, soft and nontender.  Extremities: No pedal edema. Skin: No rashes, lesions or ulcers  Data Reviewed:    Labs: Basic Metabolic Panel: Recent Labs  Lab 10/03/20 0345 10/04/20 0307 10/05/20 0328 10/06/20 0704 10/07/20 0311  NA 133* 130* 126* 128* 127*  K 4.0 4.7 5.7* 5.3* 5.7*  CL 96* 94* 92* 93* 90*  CO2 27 23 19* 24 23  GLUCOSE 155* 96 83 95 112*  BUN 27* 52* 85* 70* 94*  CREATININE 5.49* 8.47* 11.15* 9.49*  11.29*  CALCIUM 7.6* 7.5* 7.5* 7.8* 7.9*  PHOS 5.3* 6.6* 7.5* 6.7* 8.1*    GFR Estimated Creatinine Clearance: 7.1 mL/min (A) (by C-G formula based on SCr of 11.29 mg/dL (H)). Liver Function Tests: Recent Labs  Lab 10/03/20 0345 10/04/20 0307 10/05/20 0328 10/06/20 0704 10/07/20 0311  AST 606* 448* 310* 259* 194*  ALT 351* 293* 241* 207* 176*  ALKPHOS 43 44 41 36* 40  BILITOT 0.7 0.6 0.5 0.7 0.7  PROT 5.3* 5.0* 4.9* 4.8* 4.6*  ALBUMIN 2.2*  2.1* 2.1* 2.1*  2.1* 2.2*  2.2* 2.1*  2.1*    No results for input(s): LIPASE, AMYLASE in the last 168 hours. No results for input(s): AMMONIA in the last 168 hours. Coagulation profile No results for input(s): INR, PROTIME in the last 168 hours. COVID-19 Labs  No results for input(s): DDIMER, FERRITIN, LDH, CRP in the last 72 hours.  Lab Results  Component Value Date   SARSCOV2NAA NEGATIVE 09/28/2020    CBC: Recent Labs  Lab 10/02/20 0703 10/03/20 0345 10/05/20 0730 10/06/20 0704  WBC 9.0 9.1 12.9* 11.7*  NEUTROABS  --   --   --  7.2  HGB 10.9* 11.3* 10.4* 9.7*  HCT 31.5* 32.9* 29.7* 27.1*  MCV 90.5 91.9 91.4 88.6  PLT 146* 135* 201 189    Cardiac Enzymes: Recent Labs  Lab 10/03/20 0345 10/04/20 0307 10/05/20 0328 10/06/20 0704 10/07/20 0311  CKTOTAL >50,000* 42,595* 23,536* 17,670* 11,223*    BNP (last 3 results) No results for input(s): PROBNP in the last 8760 hours. CBG: No results for input(s): GLUCAP in the last 168 hours. D-Dimer: No results for input(s): DDIMER in the last 72 hours. Hgb A1c: No results for input(s): HGBA1C in the last 72 hours. Lipid Profile: No results for input(s): CHOL, HDL, LDLCALC, TRIG, CHOLHDL, LDLDIRECT in the last 72 hours. Thyroid function studies: No results for input(s): TSH, T4TOTAL, T3FREE, THYROIDAB in the last 72 hours.  Invalid input(s): FREET3 Anemia work up: No results for input(s): VITAMINB12, FOLATE, FERRITIN, TIBC, IRON, RETICCTPCT in the last 72  hours. Sepsis Labs: Recent Labs  Lab 10/02/20 0703 10/03/20 0345 10/05/20 0730 10/06/20 0704  WBC 9.0 9.1 12.9* 11.7*    Microbiology Recent Results (from the past 240 hour(s))  Culture, blood (Routine X 2) w Reflex to ID Panel     Status: None   Collection Time: 09/28/20  7:27 PM   Specimen: BLOOD  Result Value Ref Range Status   Specimen Description   Final    BLOOD RIGHT ANTECUBITAL Performed at [facility], [address] [place], [place] [address]    Special Requests   Final    Blood Culture results may not be optimal due to  an inadequate volume of blood received in culture bottles BOTTLES DRAWN AEROBIC AND ANAEROBIC Performed at [facility], [address], [place], [place] [address]    Culture   Final    NO GROWTH 5 DAYS Performed at [facility], [address], [place], [place] [address]    Report Status 10/03/2020 FINAL  Final  Culture, blood (Routine X 2) w Reflex to ID Panel     Status: None   Collection Time: 09/28/20  7:32 PM   Specimen: BLOOD  Result Value Ref Range Status   Specimen Description   Final    BLOOD BLOOD RIGHT ARM Performed at [facility], [address], [place], [place] [address]    Special Requests   Final    Blood Culture results may not be optimal due to an inadequate volume of blood received in culture bottles BOTTLES DRAWN AEROBIC AND ANAEROBIC Performed at [facility], [address] [place], [place] [address]    Culture   Final    NO GROWTH 5 DAYS Performed at [facility], [address], [place], [place] [address]    Report Status 10/03/2020 FINAL  Final  Resp Panel by RT-PCR (Flu A&B, Covid) Nasopharyngeal Swab     Status: None   Collection Time: 09/28/20  8:00 PM   Specimen: Nasopharyngeal Swab; Nasopharyngeal(NP) swabs in vial transport medium  Result Value Ref Range Status   SARS Coronavirus 2 by RT PCR NEGATIVE NEGATIVE Final     Comment: (NOTE) SARS-CoV-2 target nucleic acids are NOT DETECTED.  The SARS-CoV-2 RNA is generally detectable in upper respiratory specimens during the acute phase of infection. The lowest concentration of SARS-CoV-2 viral copies this assay can detect is 138 copies/mL. A negative result does not preclude SARS-Cov-2 infection and should not be used as the sole basis for treatment or other patient management decisions. A negative result may occur with  improper specimen collection/handling, submission of specimen other than nasopharyngeal swab, presence of viral mutation(s) within the areas targeted by this assay, and inadequate number of viral copies(<138 copies/mL). A negative result must be combined with clinical observations, patient history, and epidemiological information. The expected result is Negative.  Fact Sheet for Patients:  BloggerCourse.com  Fact Sheet for Healthcare Providers:  SeriousBroker.it  This test is no t yet approved or cleared by the [place] FDA and  has been authorized for detection and/or diagnosis of SARS-CoV-2 by FDA under an Emergency Use Authorization (EUA). This EUA will remain  in effect (meaning this test can be used) for the duration of the COVID-19 declaration under Section 564(b)(1) of the Act, 21 U.S.C.section 360bbb-3(b)(1), unless the authorization is terminated  or revoked sooner.       Influenza A by PCR NEGATIVE NEGATIVE Final   Influenza B by PCR NEGATIVE NEGATIVE Final    Comment: (NOTE) The Xpert Xpress SARS-CoV-2/FLU/RSV plus assay is intended as an aid in the diagnosis of influenza from Nasopharyngeal swab specimens and should not be used as a sole basis for treatment. Nasal washings and aspirates are unacceptable for Xpert Xpress SARS-CoV-2/FLU/RSV testing.  Fact Sheet for Patients: BloggerCourse.com  Fact Sheet for Healthcare  Providers: SeriousBroker.it  This test is not yet approved or cleared by the [place] FDA and has been authorized for detection and/or diagnosis of SARS-CoV-2 by FDA under an Emergency Use Authorization (EUA). This EUA will remain in effect (meaning this test can be used) for the duration of  the COVID-19 declaration under Section 564(b)(1) of the Act, 21 U.S.C. section 360bbb-3(b)(1), unless the authorization is terminated or revoked.  Performed at [facility], [address] [place], [place] [address]      Medications:    calcium acetate  1,334 mg Oral TID WC   Chlorhexidine Gluconate Cloth  6 each Topical Q0600   docusate sodium  100 mg Oral BID   fentaNYL       gabapentin  300 mg Oral QHS   heparin  5,000 Units Subcutaneous Q8H   midazolam       polyethylene glycol  17 g Oral Daily   Vitamin D (Ergocalciferol)  50,000 Units Oral Q7 days   Continuous Infusions:   ceFAZolin (ANCEF) IV 2 g (10/07/20 0841)      LOS: 9 days   Brett Butler  [facility]  10/07/2020, 8:45 AM

## 2020-10-07 NOTE — Progress Notes (Signed)
[facility] NEPHROLOGY PROGRESS NOTE  Assessment/ Plan:  #Anuric dialysis dependent AKI secondary to rhabdomyolysis:  Started HD 8/19, Continue on THS schedule : 2K 3L UF, HD cath, no hepairn TDC placed by IR 8/25 Outpatient AKI clip initiated   # Hyperkalemia: Continues with dialysis, start Lokelma 10 g daily  #HTN/volume: Weights fairly stable, blood pressures acceptable.  #  Rhabdomyolysis, nontraumatic: Secondary to prolonged dependent posturing of the left lower extremity following drug use.     # Polysubstance abuse:    #Hypocalcemia and HyperPhos, related to Rhabdo: Continue CaAcetate binder.   Subjective:  Received tunneled HD catheter this morning No reported UOP Potassium 5.7, sodium 127, BUN 94, creatinine increased by 2 in 24 hours Patient without complaint  Objective Vital signs in last 24 hours: Vitals:   10/07/20 0930 10/07/20 0935 10/07/20 0940 10/07/20 0945  BP: 137/83 (!) 144/87 136/89 (!) 144/82  Pulse: 81 80 80 81  Resp: 11 17 12 13   Temp:      TempSrc:      SpO2: 100% 100% 100% 100%  Weight:      Height:       Weight change: -0.7 kg  Intake/Output Summary (Last 24 hours) at 10/07/2020 1126 Last data filed at 10/07/2020 0803 Gross per 24 hour  Intake 765 ml  Output 200 ml  Net 565 ml        Labs: Basic Metabolic Panel: Recent Labs  Lab 10/05/20 0328 10/06/20 0704 10/07/20 0311  NA 126* 128* 127*  K 5.7* 5.3* 5.7*  CL 92* 93* 90*  CO2 19* 24 23  GLUCOSE 83 95 112*  BUN 85* 70* 94*  CREATININE 11.15* 9.49* 11.29*  CALCIUM 7.5* 7.8* 7.9*  PHOS 7.5* 6.7* 8.1*    Liver Function Tests: Recent Labs  Lab 10/05/20 0328 10/06/20 0704 10/07/20 0311  AST 310* 259* 194*  ALT 241* 207* 176*  ALKPHOS 41 36* 40  BILITOT 0.5 0.7 0.7  PROT 4.9* 4.8* 4.6*  ALBUMIN 2.1*  2.1* 2.2*  2.2* 2.1*  2.1*    No results for input(s): LIPASE, AMYLASE in the last 168 hours. No results for input(s): AMMONIA in the last 168  hours. CBC: Recent Labs  Lab 10/02/20 0703 10/03/20 0345 10/05/20 0730 10/06/20 0704  WBC 9.0 9.1 12.9* 11.7*  NEUTROABS  --   --   --  7.2  HGB 10.9* 11.3* 10.4* 9.7*  HCT 31.5* 32.9* 29.7* 27.1*  MCV 90.5 91.9 91.4 88.6  PLT 146* 135* 201 189    Cardiac Enzymes: Recent Labs  Lab 10/03/20 0345 10/04/20 0307 10/05/20 0328 10/06/20 0704 10/07/20 0311  CKTOTAL >50,000* 42,595* 10/09/20* 17,670* 11,223*    CBG: No results for input(s): GLUCAP in the last 168 hours.  Iron Studies: No results for input(s): IRON, TIBC, TRANSFERRIN, FERRITIN in the last 72 hours. 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 Patient needs to a tunneled dialysis catheter. EXAM: FLUOROSCOPIC AND ULTRASOUND GUIDED PLACEMENT OF A TUNNELED DIALYSIS CATHETER Physician: 57. Rachelle Hora, MD MEDICATIONS: Ancef 2 g; The antibiotic was administered within an appropriate time interval prior to skin puncture. ANESTHESIA/SEDATION: Versed 1.0 mg IV; Fentanyl 25 mcg IV; Moderate Sedation Time:  44 minutes The patient was continuously monitored during the procedure by the interventional radiology nurse under my direct supervision. FLUOROSCOPY TIME:  Fluoroscopy Time: 18 seconds, 1 mGy COMPLICATIONS: None immediate. PROCEDURE: The  procedure was explained to the patient. The risks and benefits of the procedure were discussed and the patient's questions were addressed. Informed consent was obtained from the patient. The patient was placed supine on the interventional table. The right neck non tunneled dialysis catheter was removed with manual compression. Ultrasound confirmed a patent right internal jugular vein. Ultrasound image obtained for documentation. The right neck and chest was prepped and draped in a sterile fashion. Maximal barrier sterile technique was utilized including caps, mask,  sterile gowns, sterile gloves, sterile drape, hand hygiene and skin antiseptic. The right neck was anesthetized with 1% lidocaine. A small incision was made with #11 blade scalpel. A 21 gauge needle directed into the right internal jugular vein with ultrasound guidance. A micropuncture dilator set was placed. A 19 cm tip to cuff Palindrome catheter was selected. The skin below the right clavicle was anesthetized and a small incision was made with an #11 blade scalpel. A subcutaneous tunnel was formed to the vein dermatotomy site. The catheter was brought through the tunnel. The vein dermatotomy site was dilated to accommodate a peel-away sheath. The catheter was placed through the peel-away sheath and directed into the central venous structures. The tip of the catheter was placed at superior cavoatrial junction with fluoroscopy. Fluoroscopic images were obtained for documentation. Both lumens were found to aspirate and flush well. The proper amount of heparin was flushed in both lumens. The vein dermatotomy site was closed using a single layer of absorbable suture and Dermabond. Gel-Foam was placed in the subcutaneous tract. The catheter was secured to the skin using Prolene suture. IMPRESSION: Successful placement of a right jugular tunneled dialysis catheter using ultrasound and fluoroscopic guidance. Electronically Signed   By: Richarda Overlie M.D.   On: 10/07/2020 11:17    Medications: Infusions:     Scheduled Medications:  calcium acetate  1,334 mg Oral TID WC   chlorhexidine       Chlorhexidine Gluconate Cloth  6 each Topical Q0600   docusate sodium  100 mg Oral BID   gabapentin  300 mg Oral QHS   gelatin adsorbable       heparin  5,000 Units Subcutaneous Q8H   heparin sodium (porcine)       lidocaine       polyethylene glycol  17 g Oral Daily   sodium zirconium cyclosilicate  10 g Oral Daily   Vitamin D (Ergocalciferol)  50,000 Units Oral Q7 days    have reviewed scheduled and prn  medications.  Physical Exam: General:NAD, comfortable, lying flat Heart:RRR, s1s2 nl Lungs:clear b/l, no crackles Abdomen:soft, Non-tender, non-distended Extremities: Lower extremities edema stable Dialysis Access: Right IJ TDC placed 8/25  Brett Butler B Brett Butler 10/07/2020,11:26 AM  LOS: 9 days

## 2020-10-07 NOTE — Progress Notes (Addendum)
OT Cancellation Note  Patient Details Name: Brett Butler MRN: [id number] DOB: [birth year]   Cancelled Treatment:    Reason Eval/Treat Not Completed: Patient at procedure or test/ unavailable. Plan to reattempt at a later time/date.   Raynald Kemp, OT Acute Rehabilitation Services Pager: [phone] Office: [phone]  10/07/2020, 9:14 AM    11:55 Second attempt made. Pt reports needing more time to recover from procedure.

## 2020-10-07 NOTE — Progress Notes (Signed)
Out Patient Arrangements:   Per [facility] admissions they will not be able to place pt due to him being AKI & no insurance.    Brett Butler [facility]PSS [phone] 

## 2020-10-08 ENCOUNTER — Inpatient Hospital Stay (HOSPITAL_COMMUNITY): Payer: Medicaid Other

## 2020-10-08 DIAGNOSIS — R569 Unspecified convulsions: Secondary | ICD-10-CM

## 2020-10-08 LAB — RENAL FUNCTION PANEL
Albumin: 2.1 g/dL — ABNORMAL LOW (ref 3.5–5.0)
Anion gap: 13 (ref 5–15)
BUN: 56 mg/dL — ABNORMAL HIGH (ref 6–20)
CO2: 25 mmol/L (ref 22–32)
Calcium: 8.1 mg/dL — ABNORMAL LOW (ref 8.9–10.3)
Chloride: 94 mmol/L — ABNORMAL LOW (ref 98–111)
Creatinine, Ser: 7.71 mg/dL — ABNORMAL HIGH (ref 0.61–1.24)
GFR, Estimated: 8 mL/min — ABNORMAL LOW (ref >60)
Glucose, Bld: 98 mg/dL (ref 70–99)
Phosphorus: 6.5 mg/dL — ABNORMAL HIGH (ref 2.5–4.6)
Potassium: 4.9 mmol/L (ref 3.5–5.1)
Sodium: 132 mmol/L — ABNORMAL LOW (ref 135–145)

## 2020-10-08 LAB — HEPATIC FUNCTION PANEL
ALT: 118 U/L — ABNORMAL HIGH (ref 0–44)
AST: 150 U/L — ABNORMAL HIGH (ref 15–41)
Albumin: 2.2 g/dL — ABNORMAL LOW (ref 3.5–5.0)
Alkaline Phosphatase: 45 U/L (ref 38–126)
Bilirubin, Direct: 0.1 mg/dL (ref 0.0–0.2)
Indirect Bilirubin: 0.6 mg/dL (ref 0.3–0.9)
Total Bilirubin: 0.7 mg/dL (ref 0.3–1.2)
Total Protein: 4.7 g/dL — ABNORMAL LOW (ref 6.5–8.1)

## 2020-10-08 LAB — CK: Total CK: 6328 U/L — ABNORMAL HIGH (ref 49–397)

## 2020-10-08 MED ORDER — LORAZEPAM 2 MG/ML IJ SOLN: 1.0000 mg | INTRAMUSCULAR | Status: DC | PRN

## 2020-10-08 MED ORDER — LORAZEPAM 2 MG/ML IJ SOLN
INTRAMUSCULAR | Status: AC
  Filled 2020-10-08: qty 1

## 2020-10-08 NOTE — Progress Notes (Signed)
PT Cancellation Note  Patient Details Name: Brett Butler MRN: [id number] DOB: 03/07/1965   Cancelled Treatment:    Reason Eval/Treat Not Completed: Patient declined, no reason specified. Pt reports concern for continued "seizures" with activity. I reminded the pt that the EEG earlier in the day showed that these episodes have been "non-epileptic events" but pt continues to decline mobility until he talks to his doctor. Asking therapy to follow up on another day. Will continue to follow and attempt to progress as able.   Vickki Muff, PT, DPT   Acute Rehabilitation Department Pager #: [phone]   Ronnie Derby 10/08/2020, 3:52 PM

## 2020-10-08 NOTE — Progress Notes (Addendum)
[facility] PROGRESS NOTE    Progress Note  Brett Butler  BOF:[id number] DOB: 01-03-1966 DOA: 09/28/2020 PCP: Lavinia Sharps, NP     Brief Narrative:   Brett Butler is an 55 y.o. male past medical history of polysubstance abuse opiates, cocaine, cannabis, schizoaffective disorder found to be lethargic by his friend responded to Narcan by EMS.  In the ED was found to be in severe acute kidney injury and severe rhabdo that required HD, HD catheter placed on 09/30/2020, he is requiring dialysis as he remains anuric. Nephrology on board and relates that we will need to continue HD until his renal function improves or we can declare him as chronic renal disease/end-stage renal disease for outpatient dialysis  Patient is uninsured, cannot be dialyzed as an outpatient for acute kidney injury.  Assessment/Plan:   Acute kidney injury likely due to pigment induced injury: He continues to be anuric. Further management per nephrology.  Hyperkalemia: Likely due to rhabdomyolysis associated acute kidney injury improved with HD.  Nontraumatic rhabdomyolysis: Secondary to prolonged dependent posturing following drug use.  Left leg weakness/sciatic neuralgic pain: Sign in the setting of prolonged time down. Physical therapy evaluated the patient and recommended CIR. Continue gabapentin.  Hyponatremia: Continue further management per renal.  Hypocalcemia: Corrected calcium appears to be around 9. PTH is elevated with a low vitamin D. He was started on calcium and vitamin D replacement therapy.  Elevated troponin/demand ischemia: In setting of rhabdomyolysis denies any chest pain or shortness of breath.  Voluntary/involuntary movement: Patient had an episode movement which are controllable he withdraws pain able to communicate relates he is hungry. Check EEG discontinue gabapentin.  Cocaine abuse: Counseling.  Leukocytosis: Likely reactive due to rhabdomyolysis.  Blood  cultures remain negative till date.   DVT prophylaxis: lovenox Family Communication:neon Status is: Inpatient  Remains inpatient appropriate because:Hemodynamically unstable  Dispo: The patient is from: Home              Anticipated d/c is to: Home              Patient currently is not medically stable to d/c.   Difficult to place patient No        Code Status:     Code Status Orders  (From admission, onward)           Start     Ordered   09/29/20 0015  Full code  Continuous        09/29/20 0019           Code Status History     This patient has a current code status but no historical code status.         IV Access:   Peripheral IV   Procedures and diagnostic studies:   DG Chest 2 View  Result Date: 10/07/2020 CLINICAL DATA:  Acute kidney injury. EXAM: CHEST - 2 VIEW COMPARISON:  11/16/2011 FINDINGS: Heart size is normal. Advanced chronic emphysema. The left chest is clear. Central line in place on the right with the tip in the SVC above the right atrium. There is abnormal density in volume loss in the right lower lobe consistent with pneumonia/atelectasis. No acute bone finding. IMPRESSION: Background emphysema.  Right lower lobe atelectasis/pneumonia. Electronically Signed   By: Paulina Fusi M.D.   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 Patient needs to a  tunneled dialysis catheter. EXAM: FLUOROSCOPIC AND ULTRASOUND GUIDED PLACEMENT OF A TUNNELED DIALYSIS CATHETER Physician: Rachelle Hora. Lowella Dandy, MD MEDICATIONS: Ancef 2 g; The antibiotic was administered within an appropriate time interval prior to skin puncture. ANESTHESIA/SEDATION: Versed 1.0 mg IV; Fentanyl 25 mcg IV; Moderate Sedation Time:  44 minutes The patient was continuously monitored during the procedure by the interventional radiology nurse under my  direct supervision. FLUOROSCOPY TIME:  Fluoroscopy Time: 18 seconds, 1 mGy COMPLICATIONS: None immediate. PROCEDURE: The procedure was explained to the patient. The risks and benefits of the procedure were discussed and the patient's questions were addressed. Informed consent was obtained from the patient. The patient was placed supine on the interventional table. The right neck non tunneled dialysis catheter was removed with manual compression. Ultrasound confirmed a patent right internal jugular vein. Ultrasound image obtained for documentation. The right neck and chest was prepped and draped in a sterile fashion. Maximal barrier sterile technique was utilized including caps, mask, sterile gowns, sterile gloves, sterile drape, hand hygiene and skin antiseptic. The right neck was anesthetized with 1% lidocaine. A small incision was made with #11 blade scalpel. A 21 gauge needle directed into the right internal jugular vein with ultrasound guidance. A micropuncture dilator set was placed. A 19 cm tip to cuff Palindrome catheter was selected. The skin below the right clavicle was anesthetized and a small incision was made with an #11 blade scalpel. A subcutaneous tunnel was formed to the vein dermatotomy site. The catheter was brought through the tunnel. The vein dermatotomy site was dilated to accommodate a peel-away sheath. The catheter was placed through the peel-away sheath and directed into the central venous structures. The tip of the catheter was placed at superior cavoatrial junction with fluoroscopy. Fluoroscopic images were obtained for documentation. Both lumens were found to aspirate and flush well. The proper amount of heparin was flushed in both lumens. The vein dermatotomy site was closed using a single layer of absorbable suture and Dermabond. Gel-Foam was placed in the subcutaneous tract. The catheter was secured to the skin using Prolene suture. IMPRESSION: Successful placement of a right jugular  tunneled dialysis catheter using ultrasound and fluoroscopic guidance. Electronically Signed   By: Richarda Overlie M.D.   On: 10/07/2020 11:17     Medical Consultants:   None.   Subjective:    Brett Butler pain is controlled, he relates he has not had a bowel movement.  Tolerating his diet well.  Objective:    Vitals:   10/07/20 2314 10/08/20 0222 10/08/20 0408 10/08/20 0752  BP: 128/81  139/72 (!) 144/86  Pulse: 85  89 86  Resp: 12  10 12   Temp: 98.3 F (36.8 C)  99.1 F (37.3 C) 98.5 F (36.9 C)  TempSrc: Oral  Oral Oral  SpO2: 94%  90% 91%  Weight:  72.9 kg    Height:       SpO2: 91 % O2 Flow Rate (L/min): 2 L/min   Intake/Output Summary (Last 24 hours) at 10/08/2020 0900 Last data filed at 10/08/2020 0752 Gross per 24 hour  Intake --  Output 2100 ml  Net -2100 ml    Filed Weights   10/07/20 0309 10/07/20 1347 10/08/20 0222  Weight: 72.5 kg 74 kg 72.9 kg    Exam: General exam: In no acute distress. Respiratory system: Good air movement and clear to auscultation. Cardiovascular system: S1 & S2 heard, RRR. No JVD. Gastrointestinal system: Abdomen is nondistended, soft and nontender.  Extremities: No pedal edema.  Skin: No rashes, lesions or ulcers Psychiatry: Judgement and insight appear normal. Mood & affect appropriate.  Data Reviewed:    Labs: Basic Metabolic Panel: Recent Labs  Lab 10/04/20 0307 10/05/20 0328 10/06/20 0704 10/07/20 0311 10/08/20 0412  NA 130* 126* 128* 127* 132*  K 4.7 5.7* 5.3* 5.7* 4.9  CL 94* 92* 93* 90* 94*  CO2 23 19* GLUCOSE 96 83 95 112* 98  BUN 52* 85* 70* 94* 56*  CREATININE 8.47* 11.15* 9.49* 11.29* 7.71*  CALCIUM 7.5* 7.5* 7.8* 7.9* 8.1*  PHOS 6.6* 7.5* 6.7* 8.1* 6.5*    GFR Estimated Creatinine Clearance: 10.4 mL/min (A) (by C-G formula based on SCr of 7.71 mg/dL (H)). Liver Function Tests: Recent Labs  Lab 10/04/20 0307 10/05/20 0328 10/06/20 0704 10/07/20 0311 10/08/20 0412  AST 448* 310*  259* 194* 150*  ALT 293* 241* 207* 176* 118*  ALKPHOS 44 41 36* 40 45  BILITOT 0.6 0.5 0.7 0.7 0.7  PROT 5.0* 4.9* 4.8* 4.6* 4.7*  ALBUMIN 2.1* 2.1*  2.1* 2.2*  2.2* 2.1*  2.1* 2.2*  2.1*    No results for input(s): LIPASE, AMYLASE in the last 168 hours. No results for input(s): AMMONIA in the last 168 hours. Coagulation profile No results for input(s): INR, PROTIME in the last 168 hours. COVID-19 Labs  No results for input(s): DDIMER, FERRITIN, LDH, CRP in the last 72 hours.  Lab Results  Component Value Date   SARSCOV2NAA NEGATIVE 09/28/2020    CBC: Recent Labs  Lab 10/02/20 0703 10/03/20 0345 10/05/20 0730 10/06/20 0704  WBC 9.0 9.1 12.9* 11.7*  NEUTROABS  --   --   --  7.2  HGB 10.9* 11.3* 10.4* 9.7*  HCT 31.5* 32.9* 29.7* 27.1*  MCV 90.5 91.9 91.4 88.6  PLT 146* 135* 201 189    Cardiac Enzymes: Recent Labs  Lab 10/04/20 0307 10/05/20 0328 10/06/20 0704 10/07/20 0311 10/08/20 0412  CKTOTAL 42,595* 16,109* 17,670* 11,223* 6,328*    BNP (last 3 results) No results for input(s): PROBNP in the last 8760 hours. CBG: No results for input(s): GLUCAP in the last 168 hours. D-Dimer: No results for input(s): DDIMER in the last 72 hours. Hgb A1c: No results for input(s): HGBA1C in the last 72 hours. Lipid Profile: No results for input(s): CHOL, HDL, LDLCALC, TRIG, CHOLHDL, LDLDIRECT in the last 72 hours. Thyroid function studies: No results for input(s): TSH, T4TOTAL, T3FREE, THYROIDAB in the last 72 hours.  Invalid input(s): FREET3 Anemia work up: No results for input(s): VITAMINB12, FOLATE, FERRITIN, TIBC, IRON, RETICCTPCT in the last 72 hours. Sepsis Labs: Recent Labs  Lab 10/02/20 0703 10/03/20 0345 10/05/20 0730 10/06/20 0704  WBC 9.0 9.1 12.9* 11.7*    Microbiology Recent Results (from the past 240 hour(s))  Culture, blood (Routine X 2) w Reflex to ID Panel     Status: None   Collection Time: 09/28/20  7:27 PM   Specimen: BLOOD  Result  Value Ref Range Status   Specimen Description   Final    BLOOD RIGHT ANTECUBITAL Performed at [facility], [address], [place], [place] [address]    Special Requests   Final    Blood Culture results may not be optimal due to an inadequate volume of blood received in culture bottles BOTTLES DRAWN AEROBIC AND ANAEROBIC Performed at [facility], [address], [place], [place] [address]    Culture   Final    NO GROWTH 5 DAYS  Performed at [facility], [address] [place], [place] [address]    Report Status 10/03/2020 FINAL  Final  Culture, blood (Routine X 2) w Reflex to ID Panel     Status: None   Collection Time: 09/28/20  7:32 PM   Specimen: BLOOD  Result Value Ref Range Status   Specimen Description   Final    BLOOD BLOOD RIGHT ARM Performed at [facility], [address] [place], [place] [address]    Special Requests   Final    Blood Culture results may not be optimal due to an inadequate volume of blood received in culture bottles BOTTLES DRAWN AEROBIC AND ANAEROBIC Performed at [facility], [address] [place], [place] [address]    Culture   Final    NO GROWTH 5 DAYS Performed at [facility], [address] [place], [place] [address]    Report Status 10/03/2020 FINAL  Final  Resp Panel by RT-PCR (Flu A&B, Covid) Nasopharyngeal Swab     Status: None   Collection Time: 09/28/20  8:00 PM   Specimen: Nasopharyngeal Swab; Nasopharyngeal(NP) swabs in vial transport medium  Result Value Ref Range Status   SARS Coronavirus 2 by RT PCR NEGATIVE NEGATIVE Final    Comment: (NOTE) SARS-CoV-2 target nucleic acids are NOT DETECTED.  The SARS-CoV-2 RNA is generally detectable in upper respiratory specimens during the acute phase of infection. The lowest concentration of SARS-CoV-2 viral copies this assay can detect is 138 copies/mL. A negative result does not preclude  SARS-Cov-2 infection and should not be used as the sole basis for treatment or other patient management decisions. A negative result may occur with  improper specimen collection/handling, submission of specimen other than nasopharyngeal swab, presence of viral mutation(s) within the areas targeted by this assay, and inadequate number of viral copies(<138 copies/mL). A negative result must be combined with clinical observations, patient history, and epidemiological information. The expected result is Negative.  Fact Sheet for Patients:  BloggerCourse.comhttps://www.fda.gov/media/152166/download  Fact Sheet for Healthcare Providers:  SeriousBroker.ithttps://www.fda.gov/media/152162/download  This test is no t yet approved or cleared by the [place] FDA and  has been authorized for detection and/or diagnosis of SARS-CoV-2 by FDA under an Emergency Use Authorization (EUA). This EUA will remain  in effect (meaning this test can be used) for the duration of the COVID-19 declaration under Section 564(b)(1) of the Act, 21 U.S.C.section 360bbb-3(b)(1), unless the authorization is terminated  or revoked sooner.       Influenza A by PCR NEGATIVE NEGATIVE Final   Influenza B by PCR NEGATIVE NEGATIVE Final    Comment: (NOTE) The Xpert Xpress SARS-CoV-2/FLU/RSV plus assay is intended as an aid in the diagnosis of influenza from Nasopharyngeal swab specimens and should not be used as a sole basis for treatment. Nasal washings and aspirates are unacceptable for Xpert Xpress SARS-CoV-2/FLU/RSV testing.  Fact Sheet for Patients: BloggerCourse.comhttps://www.fda.gov/media/152166/download  Fact Sheet for Healthcare Providers: SeriousBroker.ithttps://www.fda.gov/media/152162/download  This test is not yet approved or cleared by the [place] FDA and has been authorized for detection and/or diagnosis of SARS-CoV-2 by FDA under an Emergency Use Authorization (EUA). This EUA will remain in effect (meaning this test can be used) for the duration of  the COVID-19 declaration under Section 564(b)(1) of the Act, 21 U.S.C. section 360bbb-3(b)(1), unless the authorization is terminated or revoked.  Performed at [facility], [address] [place], [place] [address]      Medications:  calcium acetate  1,334 mg Oral TID WC   Chlorhexidine Gluconate Cloth  6 each Topical Q0600   docusate sodium  100 mg Oral BID   gabapentin  300 mg Oral QHS   heparin  5,000 Units Subcutaneous Q8H   polyethylene glycol  17 g Oral Daily   sodium zirconium cyclosilicate  10 g Oral Daily   Vitamin D (Ergocalciferol)  50,000 Units Oral Q7 days   Continuous Infusions:      LOS: 10 days   Marinda Elk  [facility]  10/08/2020, 9:00 AM

## 2020-10-08 NOTE — Plan of Care (Signed)
  Problem: Education: Goal: Knowledge of General Education information will improve Description: Including pain rating scale, medication(s)/side effects and non-pharmacologic comfort measures 10/08/2020 1446 by Mayford Knife, RN Outcome: Progressing 10/08/2020 1446 by Mayford Knife, RN Outcome: Progressing   Problem: Health Behavior/Discharge Planning: Goal: Ability to manage health-related needs will improve 10/08/2020 1446 by Mayford Knife, RN Outcome: Progressing 10/08/2020 1446 by Mayford Knife, RN Outcome: Progressing   Problem: Clinical Measurements: Goal: Ability to maintain clinical measurements within normal limits will improve 10/08/2020 1446 by Mayford Knife, RN Outcome: Progressing 10/08/2020 1446 by Mayford Knife, RN Outcome: Progressing Goal: Will remain free from infection 10/08/2020 1446 by Mayford Knife, RN Outcome: Progressing 10/08/2020 1446 by Mayford Knife, RN Outcome: Progressing Goal: Diagnostic test results will improve 10/08/2020 1446 by Mayford Knife, RN Outcome: Progressing 10/08/2020 1446 by Mayford Knife, RN Outcome: Progressing Goal: Respiratory complications will improve 10/08/2020 1446 by Mayford Knife, RN Outcome: Progressing 10/08/2020 1446 by Mayford Knife, RN Outcome: Progressing Goal: Cardiovascular complication will be avoided 10/08/2020 1446 by Mayford Knife, RN Outcome: Progressing 10/08/2020 1446 by Mayford Knife, RN Outcome: Progressing   Problem: Activity: Goal: Risk for activity intolerance will decrease 10/08/2020 1446 by Mayford Knife, RN Outcome: Progressing 10/08/2020 1446 by Mayford Knife, RN Outcome: Progressing   Problem: Nutrition: Goal: Adequate nutrition will be maintained 10/08/2020 1446 by Mayford Knife, RN Outcome: Progressing 10/08/2020 1446 by Mayford Knife, RN Outcome: Progressing   Problem: Coping: Goal: Level of anxiety will decrease 10/08/2020 1446 by Mayford Knife, RN Outcome: Progressing 10/08/2020 1446 by  Mayford Knife, RN Outcome: Progressing   Problem: Elimination: Goal: Will not experience complications related to bowel motility 10/08/2020 1446 by Mayford Knife, RN Outcome: Progressing 10/08/2020 1446 by Mayford Knife, RN Outcome: Progressing Goal: Will not experience complications related to urinary retention 10/08/2020 1446 by Mayford Knife, RN Outcome: Progressing 10/08/2020 1446 by Mayford Knife, RN Outcome: Progressing   Problem: Pain Managment: Goal: General experience of comfort will improve 10/08/2020 1446 by Mayford Knife, RN Outcome: Progressing 10/08/2020 1446 by Mayford Knife, RN Outcome: Progressing   Problem: Safety: Goal: Ability to remain free from injury will improve 10/08/2020 1446 by Mayford Knife, RN Outcome: Progressing 10/08/2020 1446 by Mayford Knife, RN Outcome: Progressing   Problem: Skin Integrity: Goal: Risk for impaired skin integrity will decrease 10/08/2020 1446 by Mayford Knife, RN Outcome: Progressing 10/08/2020 1446 by Mayford Knife, RN Outcome: Progressing

## 2020-10-08 NOTE — Progress Notes (Signed)
EEG Completed; Results Pending  

## 2020-10-08 NOTE — Progress Notes (Signed)
[facility] NEPHROLOGY PROGRESS NOTE  Assessment/ Plan:  #Anuric dialysis dependent AKI secondary to rhabdomyolysis:  Started HD 8/19, Continue on THS schedule : 2K 3L UF, HD cath, no hepairn TDC placed by IR 8/25 Outpatient AKI clip not possible because of uninsured status, will either need to be declared ESRD or recover GFR for discharge  # Hyperkalemia: Continues with dialysis, continue Lokelma 10 g daily  #HTN/volume: Weights fairly stable, blood pressures acceptable.  #  Rhabdomyolysis, nontraumatic: Secondary to prolonged dependent posturing of the left lower extremity following drug use.     # Polysubstance abuse:    #Hypocalcemia and HyperPhos, related to Rhabdo: Continue CaAcetate binder.   Subjective:  Received dialysis yesterday, 2 L UF  Potassium 4.9 this morning, remains on daily Lokelma  Having jerking episodes this morning, discussed with Dr. Radonna Ricker  No increase in UOP  Objective Vital signs in last 24 hours: Vitals:   10/07/20 2314 10/08/20 0222 10/08/20 0408 10/08/20 0752  BP: 128/81  139/72 (!) 144/86  Pulse: 85  89 86  Resp: 12  10 12   Temp: 98.3 F (36.8 C)  99.1 F (37.3 C) 98.5 F (36.9 C)  TempSrc: Oral  Oral Oral  SpO2: 94%  90% 91%  Weight:  72.9 kg    Height:       Weight change: 1.5 kg  Intake/Output Summary (Last 24 hours) at 10/08/2020 1131 Last data filed at 10/08/2020 0752 Gross per 24 hour  Intake --  Output 2100 ml  Net -2100 ml        Labs: Basic Metabolic Panel: Recent Labs  Lab 10/06/20 0704 10/07/20 0311 10/08/20 0412  NA 128* 127* 132*  K 5.3* 5.7* 4.9  CL 93* 90* 94*  CO2 24 23 25   GLUCOSE 95 112* 98  BUN 70* 94* 56*  CREATININE 9.49* 11.29* 7.71*  CALCIUM 7.8* 7.9* 8.1*  PHOS 6.7* 8.1* 6.5*    Liver Function Tests: Recent Labs  Lab 10/06/20 0704 10/07/20 0311 10/08/20 0412  AST 259* 194* 150*  ALT 207* 176* 118*  ALKPHOS 36* 40 45  BILITOT 0.7 0.7 0.7  PROT 4.8* 4.6* 4.7*   ALBUMIN 2.2*  2.2* 2.1*  2.1* 2.2*  2.1*    No results for input(s): LIPASE, AMYLASE in the last 168 hours. No results for input(s): AMMONIA in the last 168 hours. CBC: Recent Labs  Lab 10/02/20 0703 10/03/20 0345 10/05/20 0730 10/06/20 0704  WBC 9.0 9.1 12.9* 11.7*  NEUTROABS  --   --   --  7.2  HGB 10.9* 11.3* 10.4* 9.7*  HCT 31.5* 32.9* 29.7* 27.1*  MCV 90.5 91.9 91.4 88.6  PLT 146* 135* 201 189    Cardiac Enzymes: Recent Labs  Lab 10/04/20 0307 10/05/20 0328 10/06/20 0704 10/07/20 0311 10/08/20 0412  CKTOTAL 42,595* 10/09/20* 17,670* 11,223* 6,328*    CBG: No results for input(s): GLUCAP in the last 168 hours.  Iron Studies: No results for input(s): IRON, TIBC, TRANSFERRIN, FERRITIN in the last 72 hours. Studies/Results: DG Chest 2 View  Result Date: 10/07/2020 CLINICAL DATA:  Acute kidney injury. EXAM: CHEST - 2 VIEW COMPARISON:  11/16/2011 FINDINGS: Heart size is normal. Advanced chronic emphysema. The left chest is clear. Central line in place on the right with the tip in the SVC above the right atrium. There is abnormal density in volume loss in the right lower lobe consistent with pneumonia/atelectasis. No acute bone finding. IMPRESSION: Background emphysema.  Right lower lobe atelectasis/pneumonia. Electronically Signed  By: Paulina Fusi M.D.   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 Patient needs to a tunneled dialysis catheter. EXAM: FLUOROSCOPIC AND ULTRASOUND GUIDED PLACEMENT OF A TUNNELED DIALYSIS CATHETER Physician: Rachelle Hora. Lowella Dandy, MD MEDICATIONS: Ancef 2 g; The antibiotic was administered within an appropriate time interval prior to skin puncture. ANESTHESIA/SEDATION: Versed 1.0 mg IV; Fentanyl 25 mcg IV; Moderate Sedation Time:  44 minutes The patient was continuously monitored during the procedure by the  interventional radiology nurse under my direct supervision. FLUOROSCOPY TIME:  Fluoroscopy Time: 18 seconds, 1 mGy COMPLICATIONS: None immediate. PROCEDURE: The procedure was explained to the patient. The risks and benefits of the procedure were discussed and the patient's questions were addressed. Informed consent was obtained from the patient. The patient was placed supine on the interventional table. The right neck non tunneled dialysis catheter was removed with manual compression. Ultrasound confirmed a patent right internal jugular vein. Ultrasound image obtained for documentation. The right neck and chest was prepped and draped in a sterile fashion. Maximal barrier sterile technique was utilized including caps, mask, sterile gowns, sterile gloves, sterile drape, hand hygiene and skin antiseptic. The right neck was anesthetized with 1% lidocaine. A small incision was made with #11 blade scalpel. A 21 gauge needle directed into the right internal jugular vein with ultrasound guidance. A micropuncture dilator set was placed. A 19 cm tip to cuff Palindrome catheter was selected. The skin below the right clavicle was anesthetized and a small incision was made with an #11 blade scalpel. A subcutaneous tunnel was formed to the vein dermatotomy site. The catheter was brought through the tunnel. The vein dermatotomy site was dilated to accommodate a peel-away sheath. The catheter was placed through the peel-away sheath and directed into the central venous structures. The tip of the catheter was placed at superior cavoatrial junction with fluoroscopy. Fluoroscopic images were obtained for documentation. Both lumens were found to aspirate and flush well. The proper amount of heparin was flushed in both lumens. The vein dermatotomy site was closed using a single layer of absorbable suture and Dermabond. Gel-Foam was placed in the subcutaneous tract. The catheter was secured to the skin using Prolene suture. IMPRESSION:  Successful placement of a right jugular tunneled dialysis catheter using ultrasound and fluoroscopic guidance. Electronically Signed   By: Richarda Overlie M.D.   On: 10/07/2020 11:17   EEG adult  Result Date: 10/08/2020 Charlsie Quest, MD     10/08/2020 11:14 AM Patient Name: Brett Butler MRN: [id number] Epilepsy Attending: Charlsie Quest Referring Physician/Provider: Dr Hulan Amato Date: 10/08/2020 Duration: 23.44 mins Patient history: 55 year old male with an episode of involuntary movements.  EEG to evaluate for seizures. Level of alertness: Awake AEDs during EEG study: None Technical aspects: This EEG study was done with scalp electrodes positioned according to the 10-20 International system of electrode placement. Electrical activity was acquired at a sampling rate of 500Hz  and reviewed with a high frequency filter of 70Hz  and a low frequency filter of 1Hz . EEG data were recorded continuously and digitally stored. Description: The posterior dominant rhythm consists of 7.5 Hz activity of moderate voltage (25-35 uV) seen predominantly in posterior head regions, symmetric and reactive to eye opening and eye closing. Physiologic photic driving was not seen during photic stimulation.  Hyperventilation was not performed.   Multiple episodes sudden onset head jerking  as well as right lower extremity jerking were recorded throughout the study lasting for few seconds.  Concomitant EEG before, during and after the event did not show any EEG changes consistent with seizure. IMPRESSION: This study is within normal limits. No seizures or epileptiform discharges were seen throughout the recording. Multiple episodes sudden onset head jerking as well as right lower extremity jerking were recorded throughout the study lasting for few seconds.  No EEG changes seen during these episodes.  These are NON-EPILEPTIC events Priyanka Annabelle Harman    Medications: Infusions:     Scheduled Medications:  calcium acetate   1,334 mg Oral TID WC   Chlorhexidine Gluconate Cloth  6 each Topical Q0600   docusate sodium  100 mg Oral BID   heparin  5,000 Units Subcutaneous Q8H   LORazepam       polyethylene glycol  17 g Oral Daily   sodium zirconium cyclosilicate  10 g Oral Daily   Vitamin D (Ergocalciferol)  50,000 Units Oral Q7 days    have reviewed scheduled and prn medications.  Physical Exam: General:NAD, comfortable, lying flat Heart:RRR, s1s2 nl Lungs:clear b/l, no crackles Abdomen:soft, Non-tender, non-distended Extremities: Lower extremities edema stable Dialysis Access: Right IJ TDC placed 8/25  Shrey Boike B Journe Hallmark 10/08/2020,11:31 AM  LOS: 10 days

## 2020-10-08 NOTE — Procedures (Signed)
Patient Name: Brett Butler  MRN: [id number]  Epilepsy Attending: Charlsie Quest  Referring Physician/Provider: Dr Hulan Amato Date: 10/08/2020 Duration: 23.44 mins  Patient history: 55 year old male with an episode of involuntary movements.  EEG to evaluate for seizures.  Level of alertness: Awake  AEDs during EEG study: None  Technical aspects: This EEG study was done with scalp electrodes positioned according to the 10-20 International system of electrode placement. Electrical activity was acquired at a sampling rate of 500Hz  and reviewed with a high frequency filter of 70Hz  and a low frequency filter of 1Hz . EEG data were recorded continuously and digitally stored.   Description: The posterior dominant rhythm consists of 7.5 Hz activity of moderate voltage (25-35 uV) seen predominantly in posterior head regions, symmetric and reactive to eye opening and eye closing. Physiologic photic driving was not seen during photic stimulation.  Hyperventilation was not performed.     Multiple episodes sudden onset head jerking as well as right lower extremity jerking were recorded throughout the study lasting for few seconds.  Concomitant EEG before, during and after the event did not show any EEG changes consistent with seizure.  IMPRESSION: This study is within normal limits. No seizures or epileptiform discharges were seen throughout the recording.  Multiple episodes sudden onset head jerking as well as right lower extremity jerking were recorded throughout the study lasting for few seconds.  No EEG changes seen during these episodes.  These are NON-EPILEPTIC events  Brett Butler 

## 2020-10-09 LAB — RENAL FUNCTION PANEL
Albumin: 2.2 g/dL — ABNORMAL LOW (ref 3.5–5.0)
Anion gap: 14 (ref 5–15)
BUN: 80 mg/dL — ABNORMAL HIGH (ref 6–20)
CO2: 22 mmol/L (ref 22–32)
Calcium: 7.9 mg/dL — ABNORMAL LOW (ref 8.9–10.3)
Chloride: 93 mmol/L — ABNORMAL LOW (ref 98–111)
Creatinine, Ser: 10.58 mg/dL — ABNORMAL HIGH (ref 0.61–1.24)
GFR, Estimated: 5 mL/min — ABNORMAL LOW (ref >60)
Glucose, Bld: 102 mg/dL — ABNORMAL HIGH (ref 70–99)
Phosphorus: 7.7 mg/dL — ABNORMAL HIGH (ref 2.5–4.6)
Potassium: 5.4 mmol/L — ABNORMAL HIGH (ref 3.5–5.1)
Sodium: 129 mmol/L — ABNORMAL LOW (ref 135–145)

## 2020-10-09 LAB — CK: Total CK: 3879 U/L — ABNORMAL HIGH (ref 49–397)

## 2020-10-09 MED ORDER — HEPARIN SODIUM (PORCINE) 1000 UNIT/ML IJ SOLN
INTRAMUSCULAR | Status: AC
  Filled 2020-10-09: qty 4

## 2020-10-09 NOTE — Progress Notes (Signed)
[facility] NEPHROLOGY PROGRESS NOTE  Assessment/ Plan:  #Anuric dialysis dependent AKI secondary to rhabdomyolysis:  Started HD 8/19 Continue on THS schedule : 3h, 2K 3L UF, TDC, no hepairn TDC placed by IR 8/25 Outpatient AKI clip not possible because of uninsured status, will either need to be declared ESRD or recover GFR for discharge  # Hyperkalemia: Mild, Continues with dialysis, continue Lokelma 10 g daily  #HTN/volume: Weights fairly stable, blood pressures acceptable.  #  Rhabdomyolysis, nontraumatic: Secondary to prolonged dependent posturing of the left lower extremity following drug use.   CK downtrending  # Polysubstance abuse:    #Hypocalcemia and HyperPhos, related to Rhabdo: Continue CaAcetate binder.   Subjective:  No interval events For HD today No sig inc in UOP AML K 5.4, BUN 80  Objective Vital signs in last 24 hours: Vitals:   10/08/20 2015 10/08/20 2343 10/09/20 0318 10/09/20 0743  BP: (!) 141/81 134/80 (!) 152/82 (!) 147/89  Pulse: 87 86 86 96  Resp: 17 13 13 20   Temp: 99.9 F (37.7 C) 98.9 F (37.2 C) 98.7 F (37.1 C) 98.7 F (37.1 C)  TempSrc: Oral Oral Oral   SpO2: 95% 92% 93% 91%  Weight:      Height:       Weight change:   Intake/Output Summary (Last 24 hours) at 10/09/2020 1043 Last data filed at 10/09/2020 0400 Gross per 24 hour  Intake 920 ml  Output 200 ml  Net 720 ml        Labs: Basic Metabolic Panel: Recent Labs  Lab 10/07/20 0311 10/08/20 0412 10/09/20 0658  NA 127* 132* 129*  K 5.7* 4.9 5.4*  CL 90* 94* 93*  CO2 23 25 22   GLUCOSE 112* 98 102*  BUN 94* 56* 80*  CREATININE 11.29* 7.71* 10.58*  CALCIUM 7.9* 8.1* 7.9*  PHOS 8.1* 6.5* 7.7*    Liver Function Tests: Recent Labs  Lab 10/06/20 0704 10/07/20 0311 10/08/20 0412 10/09/20 0658  AST 259* 194* 150*  --   ALT 207* 176* 118*  --   ALKPHOS 36* 40 45  --   BILITOT 0.7 0.7 0.7  --   PROT 4.8* 4.6* 4.7*  --   ALBUMIN 2.2*  2.2* 2.1*   2.1* 2.2*  2.1* 2.2*    No results for input(s): LIPASE, AMYLASE in the last 168 hours. No results for input(s): AMMONIA in the last 168 hours. CBC: Recent Labs  Lab 10/03/20 0345 10/05/20 0730 10/06/20 0704  WBC 9.1 12.9* 11.7*  NEUTROABS  --   --  7.2  HGB 11.3* 10.4* 9.7*  HCT 32.9* 29.7* 27.1*  MCV 91.9 91.4 88.6  PLT 135* 201 189    Cardiac Enzymes: Recent Labs  Lab 10/05/20 0328 10/06/20 0704 10/07/20 0311 10/08/20 0412 10/09/20 0658  CKTOTAL 10/10/20* 17,670* 11,223* 6,328* 3,879*    CBG: No results for input(s): GLUCAP in the last 168 hours.  Iron Studies: No results for input(s): IRON, TIBC, TRANSFERRIN, FERRITIN in the last 72 hours. Studies/Results: DG Chest 2 View  Result Date: 10/07/2020 CLINICAL DATA:  Acute kidney injury. EXAM: CHEST - 2 VIEW COMPARISON:  11/16/2011 FINDINGS: Heart size is normal. Advanced chronic emphysema. The left chest is clear. Central line in place on the right with the tip in the SVC above the right atrium. There is abnormal density in volume loss in the right lower lobe consistent with pneumonia/atelectasis. No acute bone finding. IMPRESSION: Background emphysema.  Right lower lobe atelectasis/pneumonia. Electronically Signed  By: Paulina Fusi M.D.   On: 10/07/2020 16:44   EEG adult  Result Date: 10/08/2020 Charlsie Quest, MD     10/08/2020 11:14 AM Patient Name: Brett Butler MRN: [id number] Epilepsy Attending: Charlsie Quest Referring Physician/Provider: Dr Hulan Amato Date: 10/08/2020 Duration: 23.44 mins Patient history: 55 year old male with an episode of involuntary movements.  EEG to evaluate for seizures. Level of alertness: Awake AEDs during EEG study: None Technical aspects: This EEG study was done with scalp electrodes positioned according to the 10-20 International system of electrode placement. Electrical activity was acquired at a sampling rate of 500Hz  and reviewed with a high frequency filter of 70Hz  and a  low frequency filter of 1Hz . EEG data were recorded continuously and digitally stored. Description: The posterior dominant rhythm consists of 7.5 Hz activity of moderate voltage (25-35 uV) seen predominantly in posterior head regions, symmetric and reactive to eye opening and eye closing. Physiologic photic driving was not seen during photic stimulation.  Hyperventilation was not performed.   Multiple episodes sudden onset head jerking as well as right lower extremity jerking were recorded throughout the study lasting for few seconds.  Concomitant EEG before, during and after the event did not show any EEG changes consistent with seizure. IMPRESSION: This study is within normal limits. No seizures or epileptiform discharges were seen throughout the recording. Multiple episodes sudden onset head jerking as well as right lower extremity jerking were recorded throughout the study lasting for few seconds.  No EEG changes seen during these episodes.  These are NON-EPILEPTIC events Priyanka    Medications: Infusions:     Scheduled Medications:  calcium acetate  1,334 mg Oral TID WC   Chlorhexidine Gluconate Cloth  6 each Topical Q0600   docusate sodium  100 mg Oral BID   heparin  5,000 Units Subcutaneous Q8H   polyethylene glycol  17 g Oral Daily   sodium zirconium cyclosilicate  10 g Oral Daily   Vitamin D (Ergocalciferol)  50,000 Units Oral Q7 days    have reviewed scheduled and prn medications.  Physical Exam: General:NAD, comfortable, lying flat Heart:RRR, s1s2 nl Lungs:clear b/l, no crackles Abdomen:soft, Non-tender, non-distended Extremities: Lower extremities edema stable Dialysis Access: Right IJ TDC placed 8/25  Brett Butler 10/09/2020,10:43 AM  LOS: 11 days

## 2020-10-09 NOTE — Progress Notes (Signed)
Physical Therapy Treatment Patient Details Name: Brett Butler MRN: [id number] DOB: [birth year] Today's Date: 10/09/2020    History of Present Illness Pt is a 55 y.o. male who presented 09/28/20 with lethargy s/p fall after cocaine use. pt found to have ARF and L lower extremity myositis with rhabdomyolosis and possible sciatic nerve damage. PMH: polysubstance abuse    PT Comments    Pt progressing slowly towards his physical therapy goals. Reports proximal, posterior left lower extremity pain. Noted contusion; notified RN who was present to assess. Pt ambulating 40 feet with a walker at a supervision level. Continues with 0/5 left ankle dorsiflexion, but is able to adequately clear foot off of ground during swing phase. Will continue to progress as tolerated.    Follow Up Recommendations  Outpatient PT v Home health PT (will benefit from charity HHPT if can receive, otherwise recommending OPPT)     Equipment Recommendations  Rolling walker with 5" wheels;Wheelchair (measurements PT);Wheelchair cushion (measurements PT)    Recommendations for Other Services       Precautions / Restrictions Precautions Precautions: Fall Restrictions Weight Bearing Restrictions: No    Mobility  Bed Mobility Overal bed mobility: Modified Independent             General bed mobility comments: HOB elevated, increased time/effort    Transfers Overall transfer level: Needs assistance Equipment used: Rolling walker (2 wheeled) Transfers: Sit to/from Stand Sit to Stand: Supervision            Ambulation/Gait Ambulation/Gait assistance: Supervision;Min guard Gait Distance (Feet): 40 Feet Assistive device: Rolling walker (2 wheeled) Gait Pattern/deviations: Step-to pattern;Decreased step length - left;Decreased weight shift to left;Decreased dorsiflexion - left;Decreased stride length;Trunk flexed Gait velocity: decreased   General Gait Details: Pt with one minor LOB when taking his  hands off RW, requiring min guard A to correct. Otherwise supervision for ambulation. Very slow and effortful pace. Decreased left heel strike in setting of dorsiflexion weakness   Stairs             Wheelchair Mobility    Modified Rankin (Stroke Patients Only)       Balance Overall balance assessment: Needs assistance   Sitting balance-Leahy Scale: Good     Standing balance support: During functional activity;No upper extremity supported Standing balance-Leahy Scale: Poor                              Cognition Arousal/Alertness: Awake/alert Behavior During Therapy: WFL for tasks assessed/performed Overall Cognitive Status: Within Functional Limits for tasks assessed                                 General Comments: Voicing paranoid thoughts today, but difficult to tell if just his sense of humor      Exercises      General Comments        Pertinent Vitals/Pain Pain Assessment: Faces Faces Pain Scale: Hurts little more Pain Location: posterior L thigh Pain Descriptors / Indicators: Discomfort;Grimacing;Guarding Pain Intervention(s): Monitored during session    Home Living                      Prior Function            PT Goals (current goals can now be found in the care plan section) Acute Rehab PT Goals Patient Stated Goal: return home and remain  independent with ADLs Potential to Achieve Goals: Good Progress towards PT goals: Progressing toward goals    Frequency    Min 3X/week      PT Plan Current plan remains appropriate    Co-evaluation              AM-PAC PT "6 Clicks" Mobility   Outcome Measure  Help needed turning from your back to your side while in a flat bed without using bedrails?: None Help needed moving from lying on your back to sitting on the side of a flat bed without using bedrails?: A Little Help needed moving to and from a bed to a chair (including a wheelchair)?: A  Little Help needed standing up from a chair using your arms (e.g., wheelchair or bedside chair)?: A Little Help needed to walk in hospital room?: A Little Help needed climbing 3-5 steps with a railing? : A Lot 6 Click Score: 18    End of Session Equipment Utilized During Treatment: Gait belt Activity Tolerance: Patient tolerated treatment well Patient left: with call bell/phone within reach;in chair;with chair alarm set Nurse Communication: Mobility status PT Visit Diagnosis: Other abnormalities of gait and mobility (R26.89);Unsteadiness on feet (R26.81);Muscle weakness (generalized) (M62.81);History of falling (Z91.81);Difficulty in walking, not elsewhere classified (R26.2);Other symptoms and signs involving the nervous system (R29.898);Pain Pain - Right/Left: Left Pain - part of body: Hip     Time: 1227-1301 PT Time Calculation (min) (ACUTE ONLY): 34 min  Charges:  $Therapeutic Activity: 23-37 mins                     Lillia Pauls, PT, DPT Acute Rehabilitation Services Pager [phone] Office [phone]    Norval Morton 10/09/2020, 2:48 PM

## 2020-10-09 NOTE — Progress Notes (Signed)
[facility] PROGRESS NOTE    Progress Note  Brett Butler  EVO:[id number] DOB: [birth year] DOA: 09/28/2020 PCP: Lavinia Sharps, NP     Brief Narrative:   Brett Butler is an 55 y.o. male past medical history of polysubstance abuse opiates, cocaine, cannabis, schizoaffective disorder found to be lethargic by his friend responded to Narcan by EMS.  In the ED was found to be in severe acute kidney injury and severe rhabdo that required HD, HD catheter placed on 09/30/2020, he is requiring dialysis as he remains anuric. Nephrology on board and relates that we will need to continue HD until his renal function improves or we can declare him as chronic renal disease/end-stage renal disease for outpatient dialysis  Patient is uninsured, cannot be dialyzed as an outpatient for acute kidney injury.  He will have to remain in to the hospital until 1 or 2 things happen either his kidney function improved or we declare him end-stage renal disease  Assessment/Plan:   Acute kidney injury likely due to pigment induced injury: He continues to be anuric. Further management per nephrology.  He will have to stay in the hospital as he uninsured until we can declare end-stage renal disease.  Hyperkalemia: Likely due to rhabdomyolysis associated acute kidney injury improved with HD.  Nontraumatic rhabdomyolysis: Secondary to prolonged dependent posturing following drug use.  Left leg weakness/sciatic neuralgic pain: Sign in the setting of prolonged time down. Physical therapy evaluated the patient and recommended CIR. Continue gabapentin.  Hyponatremia: Continue further management per renal.  Hypocalcemia: Corrected calcium appears to be around 9. PTH is elevated with a low vitamin D. He was started on calcium and vitamin D replacement therapy.  Elevated troponin/demand ischemia: In setting of rhabdomyolysis denies any chest pain or shortness of breath.  Voluntary/involuntary  movement/nonorganic seizures: EEG was negative for seizure disorder. Gabapentin has been discontinued. Question what is his gain  Cocaine abuse: Counseling.  Leukocytosis: Likely reactive due to rhabdomyolysis.  Blood cultures remain negative till date.   DVT prophylaxis: lovenox Family Communication:neon Status is: Inpatient  Remains inpatient appropriate because:Hemodynamically unstable  Dispo: The patient is from: Home              Anticipated d/c is to: Home              Patient currently is not medically stable to d/c.   Difficult to place patient No        Code Status:     Code Status Orders  (From admission, onward)           Start     Ordered   09/29/20 0015  Full code  Continuous        09/29/20 0019           Code Status History     This patient has a current code status but no historical code status.         IV Access:   Peripheral IV   Procedures and diagnostic studies:   DG Chest 2 View  Result Date: 10/07/2020 CLINICAL DATA:  Acute kidney injury. EXAM: CHEST - 2 VIEW COMPARISON:  11/16/2011 FINDINGS: Heart size is normal. Advanced chronic emphysema. The left chest is clear. Central line in place on the right with the tip in the SVC above the right atrium. There is abnormal density in volume loss in the right lower lobe consistent with pneumonia/atelectasis. No acute bone finding. IMPRESSION: Background emphysema.  Right lower lobe atelectasis/pneumonia. Electronically Signed  By: Paulina FusiMark  Shogry M.D.   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 Patient needs to a tunneled dialysis catheter. EXAM: FLUOROSCOPIC AND ULTRASOUND GUIDED PLACEMENT OF A TUNNELED DIALYSIS CATHETER Physician: Rachelle HoraAdam R. Lowella DandyHenn, MD MEDICATIONS: Ancef 2 g; The antibiotic was administered within an appropriate time interval  prior to skin puncture. ANESTHESIA/SEDATION: Versed 1.0 mg IV; Fentanyl 25 mcg IV; Moderate Sedation Time:  44 minutes The patient was continuously monitored during the procedure by the interventional radiology nurse under my direct supervision. FLUOROSCOPY TIME:  Fluoroscopy Time: 18 seconds, 1 mGy COMPLICATIONS: None immediate. PROCEDURE: The procedure was explained to the patient. The risks and benefits of the procedure were discussed and the patient's questions were addressed. Informed consent was obtained from the patient. The patient was placed supine on the interventional table. The right neck non tunneled dialysis catheter was removed with manual compression. Ultrasound confirmed a patent right internal jugular vein. Ultrasound image obtained for documentation. The right neck and chest was prepped and draped in a sterile fashion. Maximal barrier sterile technique was utilized including caps, mask, sterile gowns, sterile gloves, sterile drape, hand hygiene and skin antiseptic. The right neck was anesthetized with 1% lidocaine. A small incision was made with #11 blade scalpel. A 21 gauge needle directed into the right internal jugular vein with ultrasound guidance. A micropuncture dilator set was placed. A 19 cm tip to cuff Palindrome catheter was selected. The skin below the right clavicle was anesthetized and a small incision was made with an #11 blade scalpel. A subcutaneous tunnel was formed to the vein dermatotomy site. The catheter was brought through the tunnel. The vein dermatotomy site was dilated to accommodate a peel-away sheath. The catheter was placed through the peel-away sheath and directed into the central venous structures. The tip of the catheter was placed at superior cavoatrial junction with fluoroscopy. Fluoroscopic images were obtained for documentation. Both lumens were found to aspirate and flush well. The proper amount of heparin was flushed in both lumens. The vein dermatotomy site  was closed using a single layer of absorbable suture and Dermabond. Gel-Foam was placed in the subcutaneous tract. The catheter was secured to the skin using Prolene suture. IMPRESSION: Successful placement of a right jugular tunneled dialysis catheter using ultrasound and fluoroscopic guidance. Electronically Signed   By: Richarda OverlieAdam  Henn M.D.   On: 10/07/2020 11:17   EEG adult  Result Date: 10/08/2020 Charlsie QuestYadav, Priyanka O, MD     10/08/2020 11:14 AM Patient Name: Brett BarlowRaymond Gaw MRN: [id number] Epilepsy Attending: Charlsie QuestPriyanka O Yadav Referring Physician/Provider: Dr Hulan AmatoAbaraham Feliz Ortiz Date: 10/08/2020 Duration: 23.44 mins Patient history: 55 year old male with an episode of involuntary movements.  EEG to evaluate for seizures. Level of alertness: Awake AEDs during EEG study: None Technical aspects: This EEG study was done with scalp electrodes positioned according to the 10-20 International system of electrode placement. Electrical activity was acquired at a sampling rate of 500Hz  and reviewed with a high frequency filter of 70Hz  and a low frequency filter of 1Hz . EEG data were recorded continuously and digitally stored. Description: The posterior dominant rhythm consists of 7.5 Hz activity of moderate voltage (25-35 uV) seen predominantly in posterior head regions, symmetric and reactive to eye opening and eye closing. Physiologic photic driving was not seen during photic stimulation.  Hyperventilation was not performed.   Multiple episodes sudden onset head jerking  as well as right lower extremity jerking were recorded throughout the study lasting for few seconds.  Concomitant EEG before, during and after the event did not show any EEG changes consistent with seizure. IMPRESSION: This study is within normal limits. No seizures or epileptiform discharges were seen throughout the recording. Multiple episodes sudden onset head jerking as well as right lower extremity jerking were recorded throughout the study lasting for few  seconds.  No EEG changes seen during these episodes.  These are NON-EPILEPTIC events Priyanka Annabelle Harman     Medical Consultants:   None.   Subjective:    Brett Butler no complaints this morning cannot explain what was happening yesterday.  Objective:    Vitals:   10/08/20 2015 10/08/20 2343 10/09/20 0318 10/09/20 0743  BP: (!) 141/81 134/80 (!) 152/82 (!) 147/89  Pulse: 87 86 86 96  Resp: 17 13 13 20   Temp: 99.9 F (37.7 C) 98.9 F (37.2 C) 98.7 F (37.1 C) 98.7 F (37.1 C)  TempSrc: Oral Oral Oral   SpO2: 95% 92% 93% 91%  Weight:      Height:       SpO2: 91 % O2 Flow Rate (L/min): 2 L/min   Intake/Output Summary (Last 24 hours) at 10/09/2020 0912 Last data filed at 10/09/2020 0400 Gross per 24 hour  Intake 920 ml  Output 200 ml  Net 720 ml    Filed Weights   10/07/20 0309 10/07/20 1347 10/08/20 0222  Weight: 72.5 kg 74 kg 72.9 kg    Exam: General exam: In no acute distress. Respiratory system: Good air movement and clear to auscultation. Cardiovascular system: S1 & S2 heard, RRR. No JVD. Gastrointestinal system: Abdomen is nondistended, soft and nontender.  Extremities: No pedal edema. Skin: No rashes, lesions or ulcers  Data Reviewed:    Labs: Basic Metabolic Panel: Recent Labs  Lab 10/05/20 0328 10/06/20 0704 10/07/20 0311 10/08/20 0412 10/09/20 0658  NA 126* 128* 127* 132* 129*  K 5.7* 5.3* 5.7* 4.9 5.4*  CL 92* 93* 90* 94* 93*  CO2 19* 24 23 25 22   GLUCOSE 83 95 112* 98 102*  BUN 85* 70* 94* 56* 80*  CREATININE 11.15* 9.49* 11.29* 7.71* 10.58*  CALCIUM 7.5* 7.8* 7.9* 8.1* 7.9*  PHOS 7.5* 6.7* 8.1* 6.5* 7.7*    GFR Estimated Creatinine Clearance: 7.5 mL/min (A) (by C-G formula based on SCr of 10.58 mg/dL (H)). Liver Function Tests: Recent Labs  Lab 10/04/20 0307 10/05/20 0328 10/06/20 0704 10/07/20 0311 10/08/20 0412 10/09/20 0658  AST 448* 310* 259* 194* 150*  --   ALT 293* 241* 207* 176* 118*  --   ALKPHOS 44 41 36* 40 45   --   BILITOT 0.6 0.5 0.7 0.7 0.7  --   PROT 5.0* 4.9* 4.8* 4.6* 4.7*  --   ALBUMIN 2.1* 2.1*  2.1* 2.2*  2.2* 2.1*  2.1* 2.2*  2.1* 2.2*    No results for input(s): LIPASE, AMYLASE in the last 168 hours. No results for input(s): AMMONIA in the last 168 hours. Coagulation profile No results for input(s): INR, PROTIME in the last 168 hours. COVID-19 Labs  No results for input(s): DDIMER, FERRITIN, LDH, CRP in the last 72 hours.  Lab Results  Component Value Date   SARSCOV2NAA NEGATIVE 09/28/2020    CBC: Recent Labs  Lab 10/03/20 0345 10/05/20 0730 10/06/20 0704  WBC 9.1 12.9* 11.7*  NEUTROABS  --   --  7.2  HGB 11.3* 10.4* 9.7*  HCT  32.9* 29.7* 27.1*  MCV 91.9 91.4 88.6  PLT 135* 201 189    Cardiac Enzymes: Recent Labs  Lab 10/05/20 0328 10/06/20 0704 10/07/20 0311 10/08/20 0412 10/09/20 0658  CKTOTAL 76,283* 17,670* 11,223* 6,328* 3,879*    BNP (last 3 results) No results for input(s): PROBNP in the last 8760 hours. CBG: No results for input(s): GLUCAP in the last 168 hours. D-Dimer: No results for input(s): DDIMER in the last 72 hours. Hgb A1c: No results for input(s): HGBA1C in the last 72 hours. Lipid Profile: No results for input(s): CHOL, HDL, LDLCALC, TRIG, CHOLHDL, LDLDIRECT in the last 72 hours. Thyroid function studies: No results for input(s): TSH, T4TOTAL, T3FREE, THYROIDAB in the last 72 hours.  Invalid input(s): FREET3 Anemia work up: No results for input(s): VITAMINB12, FOLATE, FERRITIN, TIBC, IRON, RETICCTPCT in the last 72 hours. Sepsis Labs: Recent Labs  Lab 10/03/20 0345 10/05/20 0730 10/06/20 0704  WBC 9.1 12.9* 11.7*    Microbiology No results found for this or any previous visit (from the past 240 hour(s)).    Medications:    calcium acetate  1,334 mg Oral TID WC   Chlorhexidine Gluconate Cloth  6 each Topical Q0600   docusate sodium  100 mg Oral BID   heparin  5,000 Units Subcutaneous Q8H   polyethylene glycol   17 g Oral Daily   sodium zirconium cyclosilicate  10 g Oral Daily   Vitamin D (Ergocalciferol)  50,000 Units Oral Q7 days   Continuous Infusions:      LOS: 11 days   Marinda Elk  [facility]  10/09/2020, 9:12 AM

## 2020-10-09 NOTE — Discharge Instructions (Signed)
                  Intensive Outpatient Programs  [facility]    [facility] [address]     [address] [place],  [place]     [place], [place] [phone]      [phone]   Behavioral Health Outpatient   [facility]  (Inpatient and outpatient)  [phone] (Suboxone and Methadone) [address]           [phone]           [facility]    [facility] - Intensive Outpatient [address]     [address] [place], [place] [address]     [place], [place]  [phone]      [phone]  [facility] (Outpatient, Inpatient, Chemical  [facility] (Groups and Residental) (insurance only) [phone]    [place], [place]          [phone]       [facility]    [facility] (for caregivers and family) [address]     [address] [place], [place]     [place], [place] [phone]      [phone]  Residential Treatment Programs  [facility]  [place](2 years) Residential: 90 days)  [facility] [address]      [address] [place], [place]     [place], [place] [phone]      [phone] or [phone]  [facility]    [facility] [address]      [address] [place], [place]     , [place] [phone]      [phone]  [facility]   [facility] ([facility] [address]     [address] [place], [place] [address]     [place], [place] [phone]      [phone] Admissions: 8am-3pm M-F  [facility]: Residential Program (90 Days)              [facility]: [facility]  [place], [place]     [place], [place]  [phone] or [phone]    (Walk in Hours over the weekend or by referral)   [facility]: [facility]:[phone] (for crisis  response 24 hours a day) 

## 2020-10-10 LAB — HEPATIC FUNCTION PANEL
ALT: 53 U/L — ABNORMAL HIGH (ref 0–44)
AST: 115 U/L — ABNORMAL HIGH (ref 15–41)
Albumin: 2.3 g/dL — ABNORMAL LOW (ref 3.5–5.0)
Alkaline Phosphatase: 47 U/L (ref 38–126)
Bilirubin, Direct: 0.1 mg/dL (ref 0.0–0.2)
Indirect Bilirubin: 0.6 mg/dL (ref 0.3–0.9)
Total Bilirubin: 0.7 mg/dL (ref 0.3–1.2)
Total Protein: 5.1 g/dL — ABNORMAL LOW (ref 6.5–8.1)

## 2020-10-10 LAB — RENAL FUNCTION PANEL
Albumin: 2.3 g/dL — ABNORMAL LOW (ref 3.5–5.0)
Anion gap: 12 (ref 5–15)
BUN: 44 mg/dL — ABNORMAL HIGH (ref 6–20)
CO2: 24 mmol/L (ref 22–32)
Calcium: 8.2 mg/dL — ABNORMAL LOW (ref 8.9–10.3)
Chloride: 98 mmol/L (ref 98–111)
Creatinine, Ser: 7.11 mg/dL — ABNORMAL HIGH (ref 0.61–1.24)
GFR, Estimated: 8 mL/min — ABNORMAL LOW (ref >60)
Glucose, Bld: 112 mg/dL — ABNORMAL HIGH (ref 70–99)
Phosphorus: 6.1 mg/dL — ABNORMAL HIGH (ref 2.5–4.6)
Potassium: 4.9 mmol/L (ref 3.5–5.1)
Sodium: 134 mmol/L — ABNORMAL LOW (ref 135–145)

## 2020-10-10 LAB — CK: Total CK: 2809 U/L — ABNORMAL HIGH (ref 49–397)

## 2020-10-10 MED ORDER — SIMETHICONE 80 MG PO CHEW
80.0000 mg | CHEWABLE_TABLET | Freq: Once | ORAL | Status: AC
  Administered 2020-10-10: 80 mg via ORAL
  Filled 2020-10-10: qty 1

## 2020-10-10 NOTE — Progress Notes (Signed)
[facility] NEPHROLOGY PROGRESS NOTE  Assessment/ Plan:  #Anuric dialysis dependent AKI secondary to rhabdomyolysis:  Started HD 8/19 No evidence of GFR recovery to date Continue on THS schedule : 3h, 2K 3L UF, TDC, no heparin TDC placed by IR 8/25 Outpatient AKI clip not possible because of uninsured status, will either need to be declared ESRD or recover GFR for discharge  # Hyperkalemia: Mild, Continues with dialysis, continue Lokelma 10 g daily  #HTN/volume: Weights fairly stable, blood pressures acceptable.  #  Rhabdomyolysis, nontraumatic: Secondary to prolonged dependent posturing of the left lower extremity following drug use.   CK downtrending; possibly to CIR  # Polysubstance abuse: counseled on cessation   #Hypocalcemia and HyperPhos, related to Rhabdo: Continue CaAcetate binder.   Subjective:  No interval events NO sig change in UOP  Objective Vital signs in last 24 hours: Vitals:   10/10/20 0443 10/10/20 0515 10/10/20 0743 10/10/20 0800  BP: 125/79 127/81 127/86   Pulse: (!) 101 96    Resp: 12 15 16    Temp: 99.2 F (37.3 C)   99.4 F (37.4 C)  TempSrc: Oral   Oral  SpO2: 95% 93% 95%   Weight:      Height:       Weight change:   Intake/Output Summary (Last 24 hours) at 10/10/2020 1047 Last data filed at 10/10/2020 0800 Gross per 24 hour  Intake --  Output 3300 ml  Net -3300 ml        Labs: Basic Metabolic Panel: Recent Labs  Lab 10/08/20 0412 10/09/20 0658 10/10/20 0345  NA 132* 129* 134*  K 4.9 5.4* 4.9  CL 94* 93* 98  CO2 25 22 24   GLUCOSE 98 102* 112*  BUN 56* 80* 44*  CREATININE 7.71* 10.58* 7.11*  CALCIUM 8.1* 7.9* 8.2*  PHOS 6.5* 7.7* 6.1*    Liver Function Tests: Recent Labs  Lab 10/07/20 0311 10/08/20 0412 10/09/20 0658 10/10/20 0345  AST 194* 150*  --  115*  ALT 176* 118*  --  53*  ALKPHOS 40 45  --  47  BILITOT 0.7 0.7  --  0.7  PROT 4.6* 4.7*  --  5.1*  ALBUMIN 2.1*  2.1* 2.2*  2.1* 2.2* 2.3*   2.3*    No results for input(s): LIPASE, AMYLASE in the last 168 hours. No results for input(s): AMMONIA in the last 168 hours. CBC: Recent Labs  Lab 10/05/20 0730 10/06/20 0704  WBC 12.9* 11.7*  NEUTROABS  --  7.2  HGB 10.4* 9.7*  HCT 29.7* 27.1*  MCV 91.4 88.6  PLT 201 189    Cardiac Enzymes: Recent Labs  Lab 10/06/20 0704 10/07/20 0311 10/08/20 0412 10/09/20 0658 10/10/20 0345  CKTOTAL 17,670* 11,223* 6,328* 3,879* 2,809*    CBG: No results for input(s): GLUCAP in the last 168 hours.  Iron Studies: No results for input(s): IRON, TIBC, TRANSFERRIN, FERRITIN in the last 72 hours. Studies/Results: No results found.  Medications: Infusions:     Scheduled Medications:  calcium acetate  1,334 mg Oral TID WC   Chlorhexidine Gluconate Cloth  6 each Topical Q0600   docusate sodium  100 mg Oral BID   heparin  5,000 Units Subcutaneous Q8H   polyethylene glycol  17 g Oral Daily   sodium zirconium cyclosilicate  10 g Oral Daily   Vitamin D (Ergocalciferol)  50,000 Units Oral Q7 days    have reviewed scheduled and prn medications.  Physical Exam: General:NAD, comfortable, lying flat Heart:RRR, s1s2 nl  Lungs:clear b/l, no crackles Abdomen:soft, Non-tender, non-distended Extremities: Lower extremities edema stable Dialysis Access: Right IJ TDC placed 8/25  Brett Butler B Brett Butler 10/10/2020,10:47 AM  LOS: 12 days

## 2020-10-10 NOTE — Progress Notes (Signed)
[facility] PROGRESS NOTE    Progress Note  Brett Butler  ACZ:[id number] DOB: [birth year] DOA: 09/28/2020 PCP: Lavinia Sharps, NP     Brief Narrative:   Brett Butler is an 54 y.o. male past medical history of polysubstance abuse opiates, cocaine, cannabis, schizoaffective disorder found to be lethargic by his friend responded to Narcan by EMS.  In the ED was found to be in severe acute kidney injury and severe rhabdo that required HD, HD catheter placed on 09/30/2020, he is requiring dialysis as he remains anuric. Nephrology on board and relates that we will need to continue HD until his renal function improves or we can declare him as chronic renal disease/end-stage renal disease for outpatient dialysis  Patient is uninsured, cannot be dialyzed as an outpatient for acute kidney injury.  He will have to remain in to the hospital until 1 or 2 things happen either his kidney function improved or we declare him end-stage renal disease  Assessment/Plan:   Acute kidney injury likely due to pigment induced injury: UOP cont to be poor. Further management per nephrology.  He will have to stay in the hospital as he uninsured until we can declare end-stage renal disease.  Hyperkalemia: Likely due to rhabdomyolysis associated acute kidney injury improved with HD.  Nontraumatic rhabdomyolysis: Secondary to prolonged dependent posturing following drug use.  Left leg weakness/sciatic neuralgic pain: Sign in the setting of prolonged time down. Physical therapy evaluated the patient and recommended CIR. Continue gabapentin.  Hyponatremia: Continue further management per renal.  Hypocalcemia: Corrected calcium appears to be around 9. PTH is elevated with a low vitamin D. He was started on calcium and vitamin D replacement therapy.  Elevated troponin/demand ischemia: In setting of rhabdomyolysis denies any chest pain or shortness of breath.  Voluntary/involuntary  movement/nonorganic seizures: EEG was negative for seizure disorder. Gabapentin has been discontinued. Question what is his gain  Cocaine abuse: Counseling.  Leukocytosis: Likely reactive due to rhabdomyolysis.  Blood cultures remain negative till date.   DVT prophylaxis: lovenox Family Communication:neon Status is: Inpatient  Remains inpatient appropriate because:Hemodynamically unstable  Dispo: The patient is from: Home              Anticipated d/c is to: Home              Patient currently is not medically stable to d/c.   Difficult to place patient No        Code Status:     Code Status Orders  (From admission, onward)           Start     Ordered   09/29/20 0015  Full code  Continuous        09/29/20 0019           Code Status History     This patient has a current code status but no historical code status.         IV Access:   Peripheral IV   Procedures and diagnostic studies:   EEG adult  Result Date: 10/08/2020 Charlsie Quest, MD     10/08/2020 11:14 AM Patient Name: Brett Butler MRN: [id number] Epilepsy Attending: Charlsie Quest Referring Physician/Provider: Dr Hulan Amato Date: 10/08/2020 Duration: 23.44 mins Patient history: 55 year old male with an episode of involuntary movements.  EEG to evaluate for seizures. Level of alertness: Awake AEDs during EEG study: None Technical aspects: This EEG study was done with scalp electrodes positioned according to the 10-20 International system of electrode  placement. Electrical activity was acquired at a sampling rate of 500Hz  and reviewed with a high frequency filter of 70Hz  and a low frequency filter of 1Hz . EEG data were recorded continuously and digitally stored. Description: The posterior dominant rhythm consists of 7.5 Hz activity of moderate voltage (25-35 uV) seen predominantly in posterior head regions, symmetric and reactive to eye opening and eye closing. Physiologic photic  driving was not seen during photic stimulation.  Hyperventilation was not performed.   Multiple episodes sudden onset head jerking as well as right lower extremity jerking were recorded throughout the study lasting for few seconds.  Concomitant EEG before, during and after the event did not show any EEG changes consistent with seizure. IMPRESSION: This study is within normal limits. No seizures or epileptiform discharges were seen throughout the recording. Multiple episodes sudden onset head jerking as well as right lower extremity jerking were recorded throughout the study lasting for few seconds.  No EEG changes seen during these episodes.  These are NON-EPILEPTIC events Priyanka     Medical Consultants:   None.   Subjective:    Brett Butler no compalins  Objective:    Vitals:   10/10/20 0443 10/10/20 0515 10/10/20 0743 10/10/20 0800  BP: 125/79 127/81 127/86   Pulse: (!) 101 96    Resp: 12 15 16    Temp: 99.2 F (37.3 C)   99.4 F (37.4 C)  TempSrc: Oral   Oral  SpO2: 95% 93% 95%   Weight:      Height:       SpO2: 95 % O2 Flow Rate (L/min): 2 L/min   Intake/Output Summary (Last 24 hours) at 10/10/2020 0906 Last data filed at 10/10/2020 0800 Gross per 24 hour  Intake 165 ml  Output 3300 ml  Net -3135 ml    Filed Weights   10/07/20 0309 10/07/20 1347 10/08/20 0222  Weight: 72.5 kg 74 kg 72.9 kg    Exam: General exam: In no acute distress. Respiratory system: Good air movement and clear to auscultation. Cardiovascular system: S1 & S2 heard, RRR. No JVD. Gastrointestinal system: Abdomen is nondistended, soft and nontender.  Extremities: No pedal edema. Skin: No rashes, lesions or ulcers  Data Reviewed:    Labs: Basic Metabolic Panel: Recent Labs  Lab 10/06/20 0704 10/07/20 0311 10/08/20 0412 10/09/20 0658 10/10/20 0345  NA 128* 127* 132* 129* 134*  K 5.3* 5.7* 4.9 5.4* 4.9  CL 93* 90* 94* 93* 98  CO2 24 23 25 22 24   GLUCOSE 95 112* 98 102*  112*  BUN 70* 94* 56* 80* 44*  CREATININE 9.49* 11.29* 7.71* 10.58* 7.11*  CALCIUM 7.8* 7.9* 8.1* 7.9* 8.2*  PHOS 6.7* 8.1* 6.5* 7.7* 6.1*    GFR Estimated Creatinine Clearance: 11.2 mL/min (A) (by C-G formula based on SCr of 7.11 mg/dL (H)). Liver Function Tests: Recent Labs  Lab 10/05/20 0328 10/06/20 0704 10/07/20 0311 10/08/20 0412 10/09/20 0658 10/10/20 0345  AST 310* 259* 194* 150*  --  115*  ALT 241* 207* 176* 118*  --  53*  ALKPHOS 41 36* 40 45  --  47  BILITOT 0.5 0.7 0.7 0.7  --  0.7  PROT 4.9* 4.8* 4.6* 4.7*  --  5.1*  ALBUMIN 2.1*  2.1* 2.2*  2.2* 2.1*  2.1* 2.2*  2.1* 2.2* 2.3*  2.3*    No results for input(s): LIPASE, AMYLASE in the last 168 hours. No results for input(s): AMMONIA in the last 168 hours. Coagulation profile No  results for input(s): INR, PROTIME in the last 168 hours. COVID-19 Labs  No results for input(s): DDIMER, FERRITIN, LDH, CRP in the last 72 hours.  Lab Results  Component Value Date   SARSCOV2NAA NEGATIVE 09/28/2020    CBC: Recent Labs  Lab 10/05/20 0730 10/06/20 0704  WBC 12.9* 11.7*  NEUTROABS  --  7.2  HGB 10.4* 9.7*  HCT 29.7* 27.1*  MCV 91.4 88.6  PLT 201 189    Cardiac Enzymes: Recent Labs  Lab 10/06/20 0704 10/07/20 0311 10/08/20 0412 10/09/20 0658 10/10/20 0345  CKTOTAL 17,670* 11,223* 6,328* 3,879* 2,809*    BNP (last 3 results) No results for input(s): PROBNP in the last 8760 hours. CBG: No results for input(s): GLUCAP in the last 168 hours. D-Dimer: No results for input(s): DDIMER in the last 72 hours. Hgb A1c: No results for input(s): HGBA1C in the last 72 hours. Lipid Profile: No results for input(s): CHOL, HDL, LDLCALC, TRIG, CHOLHDL, LDLDIRECT in the last 72 hours. Thyroid function studies: No results for input(s): TSH, T4TOTAL, T3FREE, THYROIDAB in the last 72 hours.  Invalid input(s): FREET3 Anemia work up: No results for input(s): VITAMINB12, FOLATE, FERRITIN, TIBC, IRON,  RETICCTPCT in the last 72 hours. Sepsis Labs: Recent Labs  Lab 10/05/20 0730 10/06/20 0704  WBC 12.9* 11.7*    Microbiology No results found for this or any previous visit (from the past 240 hour(s)).    Medications:    calcium acetate  1,334 mg Oral TID WC   Chlorhexidine Gluconate Cloth  6 each Topical Q0600   docusate sodium  100 mg Oral BID   heparin  5,000 Units Subcutaneous Q8H   heparin sodium (porcine)       polyethylene glycol  17 g Oral Daily   sodium zirconium cyclosilicate  10 g Oral Daily   Vitamin D (Ergocalciferol)  50,000 Units Oral Q7 days   Continuous Infusions:      LOS: 12 days   Marinda Elk  [facility]  10/10/2020, 9:06 AM

## 2020-10-11 LAB — RENAL FUNCTION PANEL
Albumin: 2.2 g/dL — ABNORMAL LOW (ref 3.5–5.0)
Anion gap: 14 (ref 5–15)
BUN: 64 mg/dL — ABNORMAL HIGH (ref 6–20)
CO2: 23 mmol/L (ref 22–32)
Calcium: 8.4 mg/dL — ABNORMAL LOW (ref 8.9–10.3)
Chloride: 94 mmol/L — ABNORMAL LOW (ref 98–111)
Creatinine, Ser: 9.73 mg/dL — ABNORMAL HIGH (ref 0.61–1.24)
GFR, Estimated: 6 mL/min — ABNORMAL LOW (ref >60)
Glucose, Bld: 90 mg/dL (ref 70–99)
Phosphorus: 7.7 mg/dL — ABNORMAL HIGH (ref 2.5–4.6)
Potassium: 4.6 mmol/L (ref 3.5–5.1)
Sodium: 131 mmol/L — ABNORMAL LOW (ref 135–145)

## 2020-10-11 LAB — HEPATIC FUNCTION PANEL
ALT: 37 U/L (ref 0–44)
AST: 82 U/L — ABNORMAL HIGH (ref 15–41)
Albumin: 2.2 g/dL — ABNORMAL LOW (ref 3.5–5.0)
Alkaline Phosphatase: 40 U/L (ref 38–126)
Bilirubin, Direct: 0.1 mg/dL (ref 0.0–0.2)
Indirect Bilirubin: 0.5 mg/dL (ref 0.3–0.9)
Total Bilirubin: 0.6 mg/dL (ref 0.3–1.2)
Total Protein: 5 g/dL — ABNORMAL LOW (ref 6.5–8.1)

## 2020-10-11 LAB — CK: Total CK: 1697 U/L — ABNORMAL HIGH (ref 49–397)

## 2020-10-11 NOTE — Progress Notes (Signed)
Patient ID: Brett Butler, male   DOB: 08-06-1965, 55 y.o.   MRN: [id number] S: Reports that he can move his left leg and the pain is improving. O:BP (!) 145/85 (BP Location: Right Arm)   Pulse (!) 109   Temp 99.8 F (37.7 C) (Axillary)   Resp 18   Ht 5\' 8"  (1.727 m)   Wt 72.9 kg   SpO2 91%   BMI 24.44 kg/m   Intake/Output Summary (Last 24 hours) at 10/11/2020 1130 Last data filed at 10/11/2020 0834 Gross per 24 hour  Intake 387 ml  Output 400 ml  Net -13 ml   Intake/Output: I/O last 3 completed shifts: In: 507 [P.O.:507] Out: 3600 [Urine:600; Other:3000]  Intake/Output this shift:  Total I/O In: -  Out: 100 [Urine:100] Weight change:  Gen: NAD CVS: tachy Resp: CTA Abd: +BS, soft, Nt/ND Ext: trace lower extremity edema L>R  Recent Labs  Lab 10/05/20 0328 10/06/20 0704 10/07/20 0311 10/08/20 0412 10/09/20 0658 10/10/20 0345 10/11/20 0423  NA 126* 128* 127* 132* 129* 134* 131*  K 5.7* 5.3* 5.7* 4.9 5.4* 4.9 4.6  CL 92* 93* 90* 94* 93* 98 94*  CO2 19* 24 23 25 22 24 23   GLUCOSE 83 95 112* 98 102* 112* 90  BUN 85* 70* 94* 56* 80* 44* 64*  CREATININE 11.15* 9.49* 11.29* 7.71* 10.58* 7.11* 9.73*  ALBUMIN 2.1*  2.1* 2.2*  2.2* 2.1*  2.1* 2.2*  2.1* 2.2* 2.3*  2.3* 2.2*  2.2*  CALCIUM 7.5* 7.8* 7.9* 8.1* 7.9* 8.2* 8.4*  PHOS 7.5* 6.7* 8.1* 6.5* 7.7* 6.1* 7.7*  AST 310* 259* 194* 150*  --  115* 82*  ALT 241* 207* 176* 118*  --  53* 37   Liver Function Tests: Recent Labs  Lab 10/08/20 0412 10/09/20 0658 10/10/20 0345 10/11/20 0423  AST 150*  --  115* 82*  ALT 118*  --  53* 37  ALKPHOS 45  --  47 40  BILITOT 0.7  --  0.7 0.6  PROT 4.7*  --  5.1* 5.0*  ALBUMIN 2.2*  2.1* 2.2* 2.3*  2.3* 2.2*  2.2*   No results for input(s): LIPASE, AMYLASE in the last 168 hours. No results for input(s): AMMONIA in the last 168 hours. CBC: Recent Labs  Lab 10/05/20 0730 10/06/20 0704  WBC 12.9* 11.7*  NEUTROABS  --  7.2  HGB 10.4* 9.7*  HCT 29.7* 27.1*  MCV  91.4 88.6  PLT 201 189   Cardiac Enzymes: Recent Labs  Lab 10/07/20 0311 10/08/20 0412 10/09/20 0658 10/10/20 0345 10/11/20 0423  CKTOTAL 11,223* 6,328* 3,879* 2,809* 1,697*   CBG: No results for input(s): GLUCAP in the last 168 hours.  Iron Studies: No results for input(s): IRON, TIBC, TRANSFERRIN, FERRITIN in the last 72 hours. Studies/Results: No results found.  calcium acetate  1,334 mg Oral TID WC   Chlorhexidine Gluconate Cloth  6 each Topical Q0600   docusate sodium  100 mg Oral BID   heparin  5,000 Units Subcutaneous Q8H   polyethylene glycol  17 g Oral Daily   sodium zirconium cyclosilicate  10 g Oral Daily   Vitamin D (Ergocalciferol)  50,000 Units Oral Q7 days    BMET    Component Value Date/Time   NA 131 (L) 10/11/2020 0423   K 4.6 10/11/2020 0423   CL 94 (L) 10/11/2020 0423   CO2 23 10/11/2020 0423   GLUCOSE 90 10/11/2020 0423   BUN 64 (H) 10/11/2020 0423   CREATININE  9.73 (H) 10/11/2020 0423   CALCIUM 8.4 (L) 10/11/2020 0423   GFRNONAA 6 (L) 10/11/2020 0423   GFRAA >60 07/30/2019 2135   CBC    Component Value Date/Time   WBC 11.7 (H) 10/06/2020 0704   RBC 3.06 (L) 10/06/2020 0704   HGB 9.7 (L) 10/06/2020 0704   HCT 27.1 (L) 10/06/2020 0704   PLT 189 10/06/2020 0704   MCV 88.6 10/06/2020 0704   MCH 31.7 10/06/2020 0704   MCHC 35.8 10/06/2020 0704   RDW 13.0 10/06/2020 0704   LYMPHSABS 1.9 10/06/2020 0704   MONOABS 1.5 (H) 10/06/2020 0704   EOSABS 0.7 (H) 10/06/2020 0704   BASOSABS 0.1 10/06/2020 0704     Assessment/Plan:  AKI due to rhabdomyolysis - oliguric, started HD 10/01/20.  Some increase in UOP but has been dialysis dependent so far.  He is on TTS schedule and is not candidate for outpatient AKI due to lack of insurance.  Will continue with inpatient care for now and follow for return of renal function. Rhabdomyolysis - nontraumatic due to prolonged lack of consciousness related to drug use. Polysubstance abuse - counsel  HTN -  stable Hyperphosphatemia - on calcium acetate as binder. Vascular access - RIJ TDC placed on 10/07/20 Disposition - possible CIR  Irena Cords, MD [facility] [phone]

## 2020-10-11 NOTE — Progress Notes (Signed)
[facility] PROGRESS NOTE    Progress Note  Brett Butler  OIZ:[id number] DOB: 1965-07-22 DOA: 09/28/2020 PCP: Lavinia Sharps, NP     Brief Narrative:   Brett Butler is an 55 y.o. male past medical history of polysubstance abuse opiates, cocaine, cannabis, schizoaffective disorder found to be lethargic by his friend responded to Narcan by EMS.  In the ED was found to be in severe acute kidney injury and severe rhabdo that required HD, HD catheter placed on 09/30/2020, he is requiring dialysis as he remains anuric. Nephrology on board and relates that we will need to continue HD until his renal function improves or we can declare him as chronic renal disease/end-stage renal disease for outpatient dialysis  Patient is uninsured, cannot be dialyzed as an outpatient for acute kidney injury.  He will have to remain in to the hospital until 1 or 2 things happen either his kidney function improved or we declare him end-stage renal disease  Assessment/Plan:   Acute kidney injury likely due to pigment induced injury: UOP cont to be poor. Further management per nephrology.  He will have to stay in the hospital as he uninsured until we can declare end-stage renal disease.  Hyperkalemia: Likely due to rhabdomyolysis associated acute kidney injury improved with HD.  Nontraumatic rhabdomyolysis: Secondary to prolonged dependent posturing following drug use.  Left leg weakness/sciatic neuralgic pain: Sign in the setting of prolonged time down. Physical therapy evaluated the patient and recommended CIR. Continue gabapentin.  Hyponatremia: Continue further management per renal.  Hypocalcemia: Corrected calcium appears to be around 9. PTH is elevated with a low vitamin D. He was started on calcium and vitamin D replacement therapy.  Elevated troponin/demand ischemia: In setting of rhabdomyolysis denies any chest pain or shortness of breath.  Voluntary/involuntary  movement/nonorganic seizures: EEG was negative for seizure disorder. Gabapentin has been discontinued. Question what is his gain  Cocaine abuse: Counseling.  Leukocytosis: Likely reactive due to rhabdomyolysis.  Blood cultures remain negative till date.   DVT prophylaxis: lovenox Family Communication:neon Status is: Inpatient  Remains inpatient appropriate because:Hemodynamically unstable  Dispo: The patient is from: Home              Anticipated d/c is to: Home              Patient currently is not medically stable to d/c.   Difficult to place patient No        Code Status:     Code Status Orders  (From admission, onward)           Start     Ordered   09/29/20 0015  Full code  Continuous        09/29/20 0019           Code Status History     This patient has a current code status but no historical code status.         IV Access:   Peripheral IV   Procedures and diagnostic studies:   No results found.   Medical Consultants:   None.   Subjective:    Brett Butler no complains  Objective:    Vitals:   10/10/20 1947 10/10/20 2334 10/11/20 0337 10/11/20 0756  BP: 126/69 123/73 118/69 (!) 145/85  Pulse: 100 99 96 (!) 109  Resp: 15 17 16 18   Temp: 98.5 F (36.9 C) 98.9 F (37.2 C) 99.9 F (37.7 C) 99.8 F (37.7 C)  TempSrc: Oral Oral Oral Axillary  SpO2: 96%  92% 91%   Weight:      Height:       SpO2: 91 % O2 Flow Rate (L/min): 2 L/min   Intake/Output Summary (Last 24 hours) at 10/11/2020 0908 Last data filed at 10/11/2020 0834 Gross per 24 hour  Intake 387 ml  Output 400 ml  Net -13 ml    Filed Weights   10/07/20 0309 10/07/20 1347 10/08/20 0222  Weight: 72.5 kg 74 kg 72.9 kg    Exam: General exam: In no acute distress. Respiratory system: Good air movement and clear to auscultation. Cardiovascular system: S1 & S2 heard, RRR. No JVD. Gastrointestinal system: Abdomen is nondistended, soft and nontender.   Extremities: No pedal edema. Skin: No rashes, lesions or ulcers Psychiatry: Judgement and insight appear normal. Mood & affect appropriate.  Data Reviewed:    Labs: Basic Metabolic Panel: Recent Labs  Lab 10/07/20 0311 10/08/20 0412 10/09/20 0658 10/10/20 0345 10/11/20 0423  NA 127* 132* 129* 134* 131*  K 5.7* 4.9 5.4* 4.9 4.6  CL 90* 94* 93* 98 94*  CO2 23 25 22 24 23   GLUCOSE 112* 98 102* 112* 90  BUN 94* 56* 80* 44* 64*  CREATININE 11.29* 7.71* 10.58* 7.11* 9.73*  CALCIUM 7.9* 8.1* 7.9* 8.2* 8.4*  PHOS 8.1* 6.5* 7.7* 6.1* 7.7*    GFR Estimated Creatinine Clearance: 8.2 mL/min (A) (by C-G formula based on SCr of 9.73 mg/dL (H)). Liver Function Tests: Recent Labs  Lab 10/06/20 0704 10/07/20 0311 10/08/20 0412 10/09/20 0658 10/10/20 0345 10/11/20 0423  AST 259* 194* 150*  --  115* 82*  ALT 207* 176* 118*  --  53* 37  ALKPHOS 36* 40 45  --  47 40  BILITOT 0.7 0.7 0.7  --  0.7 0.6  PROT 4.8* 4.6* 4.7*  --  5.1* 5.0*  ALBUMIN 2.2*  2.2* 2.1*  2.1* 2.2*  2.1* 2.2* 2.3*  2.3* 2.2*  2.2*    No results for input(s): LIPASE, AMYLASE in the last 168 hours. No results for input(s): AMMONIA in the last 168 hours. Coagulation profile No results for input(s): INR, PROTIME in the last 168 hours. COVID-19 Labs  No results for input(s): DDIMER, FERRITIN, LDH, CRP in the last 72 hours.  Lab Results  Component Value Date   SARSCOV2NAA NEGATIVE 09/28/2020    CBC: Recent Labs  Lab 10/05/20 0730 10/06/20 0704  WBC 12.9* 11.7*  NEUTROABS  --  7.2  HGB 10.4* 9.7*  HCT 29.7* 27.1*  MCV 91.4 88.6  PLT 201 189    Cardiac Enzymes: Recent Labs  Lab 10/07/20 0311 10/08/20 0412 10/09/20 0658 10/10/20 0345 10/11/20 0423  CKTOTAL 11,223* 6,328* 3,879* 2,809* 1,697*    BNP (last 3 results) No results for input(s): PROBNP in the last 8760 hours. CBG: No results for input(s): GLUCAP in the last 168 hours. D-Dimer: No results for input(s): DDIMER in the last  72 hours. Hgb A1c: No results for input(s): HGBA1C in the last 72 hours. Lipid Profile: No results for input(s): CHOL, HDL, LDLCALC, TRIG, CHOLHDL, LDLDIRECT in the last 72 hours. Thyroid function studies: No results for input(s): TSH, T4TOTAL, T3FREE, THYROIDAB in the last 72 hours.  Invalid input(s): FREET3 Anemia work up: No results for input(s): VITAMINB12, FOLATE, FERRITIN, TIBC, IRON, RETICCTPCT in the last 72 hours. Sepsis Labs: Recent Labs  Lab 10/05/20 0730 10/06/20 0704  WBC 12.9* 11.7*    Microbiology No results found for this or any previous visit (from the past 240 hour(s)).  Medications:    calcium acetate  1,334 mg Oral TID WC   Chlorhexidine Gluconate Cloth  6 each Topical Q0600   docusate sodium  100 mg Oral BID   heparin  5,000 Units Subcutaneous Q8H   polyethylene glycol  17 g Oral Daily   sodium zirconium cyclosilicate  10 g Oral Daily   Vitamin D (Ergocalciferol)  50,000 Units Oral Q7 days   Continuous Infusions:      LOS: 13 days   Marinda Elk  [facility]  10/11/2020, 9:08 AM

## 2020-10-11 NOTE — Progress Notes (Addendum)
Physical Therapy Treatment Patient Details Name: Brett Butler MRN: [id number] DOB: Oct 28, 1965 Today's Date: 10/11/2020    History of Present Illness Pt is a 55 y.o. male who presented 09/28/20 with lethargy s/p fall after cocaine use. pt found to have ARF and L lower extremity myositis with rhabdomyolosis and possible sciatic nerve damage. PMH: polysubstance abuse    PT Comments    Pt progressing slowly towards his physical therapy goals. Continues with significant LLE pain with weightbearing/ambulation. Discussed with RN regarding pain medication and also ordered [place] AFO to see if this would improve ease of mobility as pt continues with 0/5 left ankle dorsiflexion. Pt ambulating 40 feet with a walker at a supervision level currently. Will continue gait training as tolerated.     Follow Up Recommendations  Supervision for mobility/OOB;Outpatient PT;Home health PT (would benefit from charity HHPT if can receive)     Equipment Recommendations  Rolling walker with 5" wheels;Wheelchair (measurements PT);Wheelchair cushion (measurements PT);3in1 (PT)    Recommendations for Other Services       Precautions / Restrictions Precautions Precautions: Fall Restrictions Weight Bearing Restrictions: No    Mobility  Bed Mobility Overal bed mobility: Modified Independent Bed Mobility: Sit to Supine       Sit to supine: Supervision        Transfers Overall transfer level: Needs assistance Equipment used: Rolling walker (2 wheeled) Transfers: Sit to/from Stand Sit to Stand: Supervision            Ambulation/Gait Ambulation/Gait assistance: Supervision Gait Distance (Feet): 40 Feet Assistive device: Rolling walker (2 wheeled) Gait Pattern/deviations: Step-to pattern;Decreased step length - left;Decreased weight shift to left;Decreased dorsiflexion - left;Decreased stride length;Trunk flexed Gait velocity: decreased   General Gait Details: Very slowed pace, increased  trunk flexion, advancing LLE but reports pain with weightbearing. Cues for hip extension, upright posture, sequencing/technique   Stairs             Wheelchair Mobility    Modified Rankin (Stroke Patients Only)       Balance Overall balance assessment: Needs assistance Sitting-balance support: No upper extremity supported;Feet supported Sitting balance-Leahy Scale: Good     Standing balance support: No upper extremity supported;Single extremity supported Standing balance-Leahy Scale: Poor Standing balance comment: standing at sink to brush teeth                            Cognition Arousal/Alertness: Awake/alert Behavior During Therapy: WFL for tasks assessed/performed Overall Cognitive Status: Within Functional Limits for tasks assessed                                 General Comments: was standing at sink and had finished brushing his teeth when he started swaying back and forth and eyes blinking with increased rate, asked him if he was okay with delayed response (~15 seconds) and then he said, "that's a seizure that's what that was" completely coherent after this and did not pass out on me.      Exercises      General Comments        Pertinent Vitals/Pain Pain Assessment: Faces Faces Pain Scale: Hurts even more Pain Location: posterior L thigh Pain Descriptors / Indicators: Discomfort;Grimacing;Guarding Pain Intervention(s): Limited activity within patient's tolerance;Monitored during session    Home Living  Prior Function            PT Goals (current goals can now be found in the care plan section) Acute Rehab PT Goals Patient Stated Goal: return home and remain independent with ADLs Potential to Achieve Goals: Good Progress towards PT goals: Progressing toward goals    Frequency    Min 3X/week      PT Plan Current plan remains appropriate    Co-evaluation              AM-PAC  PT "6 Clicks" Mobility   Outcome Measure  Help needed turning from your back to your side while in a flat bed without using bedrails?: None Help needed moving from lying on your back to sitting on the side of a flat bed without using bedrails?: None Help needed moving to and from a bed to a chair (including a wheelchair)?: A Little Help needed standing up from a chair using your arms (e.g., wheelchair or bedside chair)?: A Little Help needed to walk in hospital room?: A Little Help needed climbing 3-5 steps with a railing? : A Little 6 Click Score: 20    End of Session   Activity Tolerance: Patient tolerated treatment well Patient left: Other (comment) (with OT) Nurse Communication: Mobility status PT Visit Diagnosis: Other abnormalities of gait and mobility (R26.89);Unsteadiness on feet (R26.81);Muscle weakness (generalized) (M62.81);History of falling (Z91.81);Difficulty in walking, not elsewhere classified (R26.2);Other symptoms and signs involving the nervous system (R29.898);Pain Pain - Right/Left: Left Pain - part of body: Hip     Time: 6067-7034 PT Time Calculation (min) (ACUTE ONLY): 47 min  Charges:  $Gait Training: 8-22 mins $Therapeutic Activity: 23-37 mins                     Lillia Pauls, PT, DPT Acute Rehabilitation Services Pager [phone] Office [phone]    Brett Butler 10/11/2020, 5:19 PM

## 2020-10-11 NOTE — Progress Notes (Signed)
Orthopedic Tech Progress Note Patient Details:  Brett Butler 10/17/1965 [id number]  Called in order to [facility] for a [facility]   Patient ID: Brett Butler, male   DOB: 1966-02-06, 55 y.o.   MRN: [id number]  Brett Butler 10/11/2020, 3:45 PM

## 2020-10-11 NOTE — Progress Notes (Signed)
Occupational Therapy Treatment Patient Details Name: Brett Butler MRN: [id number] DOB: 1965-11-24 Today's Date: 10/11/2020    History of present illness Pt is a 55 y.o. male who presented 09/28/20 with lethargy s/p fall after cocaine use. pt found to have ARF and L lower extremity myositis with rhabdomyolosis and possible sciatic nerve damage. PMH: polysubstance abuse   OT comments  This 55 yo male seen today at the end of his PT session. Pt stood at sink with S and brushed his teeth, had a "spell" (see cognitive section), then ambulated over to  bed with S and increased time due to pain. Into bed Mod I (increased time). Pt making progress with pain being his limiting factor. He will continue to benefit from acute OT with follow up as below.  Follow Up Recommendations  Home health OT;Outpatient OT;Supervision - Intermittent    Equipment Recommendations  3 in 1 bedside commode       Precautions / Restrictions Precautions Precautions: Fall Restrictions Weight Bearing Restrictions: No       Mobility Bed Mobility Overal bed mobility: Needs Assistance Bed Mobility: Sit to Supine       Sit to supine: Mod I        Transfers Overall transfer level: Needs assistance Equipment used: Rolling walker (2 wheeled) Transfers: Sit to/from Stand Sit to Stand: Supervision              Balance Overall balance assessment: Needs assistance Sitting-balance support: No upper extremity supported;Feet supported Sitting balance-Leahy Scale: Good     Standing balance support: No upper extremity supported;Single extremity supported Standing balance-Leahy Scale: Poor Standing balance comment: standing at sink to brush teeth                           ADL either performed or assessed with clinical judgement   ADL Overall ADL's : Needs assistance/impaired     Grooming: Oral care;Standing;Min guard                   Toilet Transfer: Min guard;Ambulation;RW Toilet  Transfer Details (indicate cue type and reason): sink in room to bed                 Vision Baseline Vision/History: 0 No visual deficits            Cognition Arousal/Alertness: Awake/alert Behavior During Therapy: WFL for tasks assessed/performed Overall Cognitive Status: Within Functional Limits for tasks assessed                                 General Comments: was standing at sink and had finished brushing his teeth when he started swaying back and forth and eyes blinking with increased rate, asked him if he was okay with delayed response (~15 seconds) and then he said, "that's a seizure that's what that was" completely coherent after this and did not pass out on me.                     Pertinent Vitals/ Pain       Pain Assessment: 0-10 Faces Pain Scale: Hurts little more Pain Location: posterior L thigh Pain Descriptors / Indicators: Discomfort;Grimacing;Guarding Pain Intervention(s): Monitored during session;Repositioned;Patient requesting pain meds-RN notified         Frequency  Min 2X/week        Progress Toward Goals  OT Goals(current goals can now  be found in the care plan section)  Progress towards OT goals: Progressing toward goals  Acute Rehab OT Goals Patient Stated Goal: return home and remain independent with ADLs OT Goal Formulation: With patient Time For Goal Achievement: 10/16/20 Potential to Achieve Goals: Good  Plan Discharge plan remains appropriate       AM-PAC OT "6 Clicks" Daily Activity     Outcome Measure   Help from another person eating meals?: None Help from another person taking care of personal grooming?: A Little Help from another person toileting, which includes using toliet, bedpan, or urinal?: A Little Help from another person bathing (including washing, rinsing, drying)?: A Little Help from another person to put on and taking off regular upper body clothing?: A Little Help from another person to  put on and taking off regular lower body clothing?: A Lot 6 Click Score: 18    End of Session Equipment Utilized During Treatment: Rolling walker  OT Visit Diagnosis: Unsteadiness on feet (R26.81);Muscle weakness (generalized) (M62.81);Pain Pain - Right/Left: Left Pain - part of body: Leg   Activity Tolerance Patient tolerated treatment well   Patient Left with call bell/phone within reach;in bed;with bed alarm set   Nurse Communication  (his report of a seizure)        Time: 8841-6606 OT Time Calculation (min): 18 min  Charges: OT General Charges $OT Visit: 1 Visit OT Treatments $Self Care/Home Management : 8-22 mins  Ignacia Palma, OTR/L Acute [employer] Pager [phone] Office [phone]     Brett Butler 10/11/2020, 3:56 PM

## 2020-10-12 ENCOUNTER — Inpatient Hospital Stay (HOSPITAL_COMMUNITY): Payer: Medicaid Other

## 2020-10-12 DIAGNOSIS — I4891 Unspecified atrial fibrillation: Secondary | ICD-10-CM

## 2020-10-12 LAB — ECHOCARDIOGRAM COMPLETE
Area-P 1/2: 3.89 cm2
Height: 68 in
S' Lateral: 3.5 cm
Weight: 2843.05 oz

## 2020-10-12 LAB — RENAL FUNCTION PANEL
Albumin: 2.4 g/dL — ABNORMAL LOW (ref 3.5–5.0)
Albumin: 2.2 g/dL — ABNORMAL LOW (ref 3.5–5.0)
Anion gap: 15 (ref 5–15)
Anion gap: 9 (ref 5–15)
BUN: 87 mg/dL — ABNORMAL HIGH (ref 6–20)
BUN: 34 mg/dL — ABNORMAL HIGH (ref 6–20)
CO2: 22 mmol/L (ref 22–32)
CO2: 28 mmol/L (ref 22–32)
Calcium: 8.4 mg/dL — ABNORMAL LOW (ref 8.9–10.3)
Calcium: 8.3 mg/dL — ABNORMAL LOW (ref 8.9–10.3)
Chloride: 93 mmol/L — ABNORMAL LOW (ref 98–111)
Chloride: 97 mmol/L — ABNORMAL LOW (ref 98–111)
Creatinine, Ser: 11.26 mg/dL — ABNORMAL HIGH (ref 0.61–1.24)
Creatinine, Ser: 5.97 mg/dL — ABNORMAL HIGH (ref 0.61–1.24)
GFR, Estimated: 5 mL/min — ABNORMAL LOW (ref >60)
GFR, Estimated: 10 mL/min — ABNORMAL LOW (ref >60)
Glucose, Bld: 96 mg/dL (ref 70–99)
Glucose, Bld: 120 mg/dL — ABNORMAL HIGH (ref 70–99)
Phosphorus: 8.3 mg/dL — ABNORMAL HIGH (ref 2.5–4.6)
Phosphorus: 4.4 mg/dL (ref 2.5–4.6)
Potassium: 4.7 mmol/L (ref 3.5–5.1)
Potassium: 3.3 mmol/L — ABNORMAL LOW (ref 3.5–5.1)
Sodium: 130 mmol/L — ABNORMAL LOW (ref 135–145)
Sodium: 134 mmol/L — ABNORMAL LOW (ref 135–145)

## 2020-10-12 LAB — CBC
HCT: 20.8 % — ABNORMAL LOW (ref 39.0–52.0)
HCT: 18.7 % — ABNORMAL LOW (ref 39.0–52.0)
Hemoglobin: 7.1 g/dL — ABNORMAL LOW (ref 13.0–17.0)
Hemoglobin: 6.4 g/dL — CL (ref 13.0–17.0)
MCH: 32.1 pg (ref 26.0–34.0)
MCH: 31.5 pg (ref 26.0–34.0)
MCHC: 34.1 g/dL (ref 30.0–36.0)
MCHC: 34.2 g/dL (ref 30.0–36.0)
MCV: 94.1 fL (ref 80.0–100.0)
MCV: 92.1 fL (ref 80.0–100.0)
Platelets: 329 10*3/uL (ref 150–400)
Platelets: 340 10*3/uL (ref 150–400)
RBC: 2.21 MIL/uL — ABNORMAL LOW (ref 4.22–5.81)
RBC: 2.03 MIL/uL — ABNORMAL LOW (ref 4.22–5.81)
RDW: 13.8 % (ref 11.5–15.5)
RDW: 13.3 % (ref 11.5–15.5)
WBC: 13.9 10*3/uL — ABNORMAL HIGH (ref 4.0–10.5)
WBC: 13.2 10*3/uL — ABNORMAL HIGH (ref 4.0–10.5)
nRBC: 0 % (ref 0.0–0.2)
nRBC: 0 % (ref 0.0–0.2)

## 2020-10-12 LAB — PREPARE RBC (CROSSMATCH)

## 2020-10-12 LAB — HEPATITIS B SURFACE ANTIBODY,QUALITATIVE: Hep B S Ab: NONREACTIVE

## 2020-10-12 LAB — ABO/RH: ABO/RH(D): A POS

## 2020-10-12 LAB — CK: Total CK: 1500 U/L — ABNORMAL HIGH (ref 49–397)

## 2020-10-12 LAB — HEPATITIS B CORE ANTIBODY, TOTAL: Hep B Core Total Ab: NONREACTIVE

## 2020-10-12 LAB — HEPATITIS B SURFACE ANTIGEN: Hepatitis B Surface Ag: NONREACTIVE

## 2020-10-12 MED ORDER — LIDOCAINE HCL (PF) 1 % IJ SOLN: 5.0000 mL | INTRAMUSCULAR | Status: DC | PRN

## 2020-10-12 MED ORDER — ALTEPLASE 2 MG IJ SOLR: 2.0000 mg | Freq: Once | INTRAMUSCULAR | Status: DC | PRN

## 2020-10-12 MED ORDER — PENTAFLUOROPROP-TETRAFLUOROETH EX AERO: 1.0000 "application " | INHALATION_SPRAY | CUTANEOUS | Status: DC | PRN

## 2020-10-12 MED ORDER — HEPARIN SODIUM (PORCINE) 1000 UNIT/ML DIALYSIS: 20.0000 [IU]/kg | INTRAMUSCULAR | Status: DC | PRN

## 2020-10-12 MED ORDER — SODIUM CHLORIDE 0.9 % IV SOLN: 100.0000 mL | INTRAVENOUS | Status: DC | PRN

## 2020-10-12 MED ORDER — LIDOCAINE-PRILOCAINE 2.5-2.5 % EX CREA: 1.0000 "application " | TOPICAL_CREAM | CUTANEOUS | Status: DC | PRN

## 2020-10-12 MED ORDER — DILTIAZEM HCL-DEXTROSE 125-5 MG/125ML-% IV SOLN (PREMIX)
5.0000 mg/h | INTRAVENOUS | Status: DC
  Administered 2020-10-12 – 2020-10-13 (×2): 5 mg/h via INTRAVENOUS
  Filled 2020-10-12 (×2): qty 125

## 2020-10-12 MED ORDER — HEPARIN SODIUM (PORCINE) 1000 UNIT/ML DIALYSIS: 1000.0000 [IU] | INTRAMUSCULAR | Status: DC | PRN

## 2020-10-12 MED ORDER — HEPARIN SODIUM (PORCINE) 1000 UNIT/ML DIALYSIS: 40.0000 [IU]/kg | INTRAMUSCULAR | Status: DC | PRN

## 2020-10-12 MED ORDER — SODIUM CHLORIDE 0.9% IV SOLUTION: Freq: Once | INTRAVENOUS | Status: DC

## 2020-10-12 MED ORDER — METOPROLOL TARTRATE 25 MG PO TABS
25.0000 mg | ORAL_TABLET | Freq: Two times a day (BID) | ORAL | Status: DC
  Administered 2020-10-12 – 2020-10-22 (×21): 25 mg via ORAL
  Filled 2020-10-12 (×21): qty 1

## 2020-10-12 MED ORDER — METOPROLOL TARTRATE 5 MG/5ML IV SOLN
5.0000 mg | Freq: Four times a day (QID) | INTRAVENOUS | Status: DC | PRN
  Filled 2020-10-12: qty 5

## 2020-10-12 MED ORDER — DILTIAZEM LOAD VIA INFUSION
10.0000 mg | Freq: Once | INTRAVENOUS | Status: AC
  Administered 2020-10-12: 10 mg via INTRAVENOUS
  Filled 2020-10-12: qty 10

## 2020-10-12 NOTE — Progress Notes (Signed)
  Echocardiogram 2D Echocardiogram has been performed.  Leta Jungling M 10/12/2020, 3:16 PM

## 2020-10-12 NOTE — Progress Notes (Signed)
[facility] PROGRESS NOTE    Progress Note  Brett Butler  GEZ:[id number] DOB: 21-Jul-1965 DOA: 09/28/2020 PCP: Lavinia Sharps, NP     Brief Narrative:   Brett Butler is an 55 y.o. male past medical history of polysubstance abuse opiates, cocaine, cannabis, schizoaffective disorder found to be lethargic by his friend responded to Narcan by EMS.  In the ED was found to be in severe acute kidney injury and severe rhabdo that required HD, HD catheter placed on 09/30/2020, he is requiring dialysis as he remains anuric. Nephrology on board and relates that we will need to continue HD until his renal function improves or we can declare him as chronic renal disease/end-stage renal disease for outpatient dialysis  Patient is uninsured, cannot be dialyzed as an outpatient for acute kidney injury.  He will have to remain in to the hospital until 1 or 2 things happen either his kidney function improved or we declare him end-stage renal disease  Assessment/Plan:   Acute kidney injury likely due to pigment induced injury: Urine output is slowly improving. Patient is uninsured so until he declares himself end-stage renal disease for insurance approval or his renal function improves he will need to stay in the hospital Further management per nephrology.  He will have to stay in the hospital as he uninsured until we can declare end-stage renal disease.  Hyperkalemia: Likely due to rhabdomyolysis associated acute kidney injury improved with HD.  Nontraumatic rhabdomyolysis: Secondary to prolonged dependent posturing following drug use.  Left leg weakness/sciatic neuralgic pain: Sign in the setting of prolonged time down. Physical therapy evaluated the patient and recommended CIR. Continue gabapentin.  Hyponatremia: Continue further management per renal.  Hypocalcemia: Corrected calcium appears to be around 9. PTH is elevated with a low vitamin D. He was started on calcium and vitamin  D replacement therapy.  Elevated troponin/demand ischemia: In setting of rhabdomyolysis denies any chest pain or shortness of breath.  Voluntary/involuntary movement/nonorganic seizures: EEG was negative for seizure disorder. Gabapentin has been discontinued. Question what is his gain  Cocaine abuse: Counseling.  Leukocytosis: Likely reactive due to rhabdomyolysis.  Blood cultures remain negative till date.  New onset atrial fibrillation: Started on IV diltiazem drip, IV metoprolol as needed for heart rate greater than 100. Check a 2D echo. With a CHADS Vascor 2 of 0-1, will do ASA.        DVT prophylaxis: lovenox Family Communication:neon Status is: Inpatient  Remains inpatient appropriate because:Hemodynamically unstable  Dispo: The patient is from: Home              Anticipated d/c is to: Home              Patient currently is not medically stable to d/c.   Difficult to place patient No      Code Status:     Code Status Orders  (From admission, onward)           Start     Ordered   09/29/20 0015  Full code  Continuous        09/29/20 0019           Code Status History     This patient has a current code status but no historical code status.         IV Access:   Peripheral IV   Procedures and diagnostic studies:   No results found.   Medical Consultants:   None.   Subjective:    Brett Butler no  complains  Objective:    Vitals:   10/11/20 1911 10/11/20 2000 10/11/20 2316 10/12/20 0324  BP: 135/70  136/76 (!) 146/83  Pulse: (!) 108 92 85 94  Resp: 12 13 13 15   Temp: 98.7 F (37.1 C)  98.7 F (37.1 C) 99.4 F (37.4 C)  TempSrc: Oral  Oral Oral  SpO2: 92% 93% 90% 93%  Weight:      Height:       SpO2: 93 % O2 Flow Rate (L/min): 2 L/min   Intake/Output Summary (Last 24 hours) at 10/12/2020 0924 Last data filed at 10/11/2020 2207 Gross per 24 hour  Intake 120 ml  Output 450 ml  Net -330 ml    Filed Weights    10/07/20 0309 10/07/20 1347 10/08/20 0222  Weight: 72.5 kg 74 kg 72.9 kg    Exam: General exam: In no acute distress. Respiratory system: Good air movement and clear to auscultation. Cardiovascular system: S1 & S2 heard, RRR. No JVD. Gastrointestinal system: Abdomen is nondistended, soft and nontender.  Extremities: No pedal edema. Skin: No rashes, lesions or ulcers   Data Reviewed:    Labs: Basic Metabolic Panel: Recent Labs  Lab 10/08/20 0412 10/09/20 0658 10/10/20 0345 10/11/20 0423 10/12/20 0227  NA 132* 129* 134* 131* 130*  K 4.9 5.4* 4.9 4.6 4.7  CL 94* 93* 98 94* 93*  CO2 25 22 24 23 22   GLUCOSE 98 102* 112* 90 96  BUN 56* 80* 44* 64* 87*  CREATININE 7.71* 10.58* 7.11* 9.73* 11.26*  CALCIUM 8.1* 7.9* 8.2* 8.4* 8.4*  PHOS 6.5* 7.7* 6.1* 7.7* 8.3*    GFR Estimated Creatinine Clearance: 7.1 mL/min (A) (by C-G formula based on SCr of 11.26 mg/dL (H)). Liver Function Tests: Recent Labs  Lab 10/06/20 0704 10/07/20 0311 10/08/20 0412 10/09/20 0658 10/10/20 0345 10/11/20 0423 10/12/20 0227  AST 259* 194* 150*  --  115* 82*  --   ALT 207* 176* 118*  --  53* 37  --   ALKPHOS 36* 40 45  --  47 40  --   BILITOT 0.7 0.7 0.7  --  0.7 0.6  --   PROT 4.8* 4.6* 4.7*  --  5.1* 5.0*  --   ALBUMIN 2.2*  2.2* 2.1*  2.1* 2.2*  2.1* 2.2* 2.3*  2.3* 2.2*  2.2* 2.4*    No results for input(s): LIPASE, AMYLASE in the last 168 hours. No results for input(s): AMMONIA in the last 168 hours. Coagulation profile No results for input(s): INR, PROTIME in the last 168 hours. COVID-19 Labs  No results for input(s): DDIMER, FERRITIN, LDH, CRP in the last 72 hours.  Lab Results  Component Value Date   SARSCOV2NAA NEGATIVE 09/28/2020    CBC: Recent Labs  Lab 10/06/20 0704 10/12/20 0227  WBC 11.7* 13.9*  NEUTROABS 7.2  --   HGB 9.7* 7.1*  HCT 27.1* 20.8*  MCV 88.6 94.1  PLT 189 329    Cardiac Enzymes: Recent Labs  Lab 10/08/20 0412 10/09/20 0658  10/10/20 0345 10/11/20 0423 10/12/20 0227  CKTOTAL 6,328* 3,879* 2,809* 1,697* 1,500*    BNP (last 3 results) No results for input(s): PROBNP in the last 8760 hours. CBG: No results for input(s): GLUCAP in the last 168 hours. D-Dimer: No results for input(s): DDIMER in the last 72 hours. Hgb A1c: No results for input(s): HGBA1C in the last 72 hours. Lipid Profile: No results for input(s): CHOL, HDL, LDLCALC, TRIG, CHOLHDL, LDLDIRECT in the last 72 hours. Thyroid  function studies: No results for input(s): TSH, T4TOTAL, T3FREE, THYROIDAB in the last 72 hours.  Invalid input(s): FREET3 Anemia work up: No results for input(s): VITAMINB12, FOLATE, FERRITIN, TIBC, IRON, RETICCTPCT in the last 72 hours. Sepsis Labs: Recent Labs  Lab 10/06/20 0704 10/12/20 0227  WBC 11.7* 13.9*    Microbiology No results found for this or any previous visit (from the past 240 hour(s)).    Medications:    calcium acetate  1,334 mg Oral TID WC   Chlorhexidine Gluconate Cloth  6 each Topical Q0600   docusate sodium  100 mg Oral BID   heparin  5,000 Units Subcutaneous Q8H   polyethylene glycol  17 g Oral Daily   sodium zirconium cyclosilicate  10 g Oral Daily   Vitamin D (Ergocalciferol)  50,000 Units Oral Q7 days   Continuous Infusions:      LOS: 14 days   Marinda Elk  [facility]  10/12/2020, 9:24 AM

## 2020-10-12 NOTE — Progress Notes (Signed)
PT Cancellation Note  Patient Details Name: Brett Butler MRN: [id number] DOB: 09-25-1965   Cancelled Treatment:    Reason Eval/Treat Not Completed: Patient at procedure or test/unavailable - at HD, will check back as schedule allows.  Marye Round, PT DPT Acute Rehabilitation Services Pager [phone]  Office [phone]    Truddie Coco 10/12/2020, 10:45 AM

## 2020-10-12 NOTE — Progress Notes (Signed)
Attempted to obtain consent for blood transfusion.  Pt states he prefers to wait until his sister arrives before signing consent.  Per pt, he expects sister to arrive at 5pm.

## 2020-10-12 NOTE — Progress Notes (Signed)
[facility] PROGRESS NOTE    Progress Note  [name]  KGS:[id number] DOB: January 01, 1966 DOA: 09/28/2020 PCP: Lavinia Sharps, NP     Brief Narrative:   Brett Butler is an 55 y.o. male past medical history of polysubstance abuse opiates, cocaine, cannabis, schizoaffective disorder found to be lethargic by his friend responded to Narcan by EMS.  In the ED was found to be in severe acute kidney injury and severe rhabdo that required HD, HD catheter placed on 09/30/2020, he is requiring dialysis as he remains anuric. Nephrology on board and relates that we will need to continue HD until his renal function improves or we can declare him as chronic renal disease/end-stage renal disease for outpatient dialysis  Patient is uninsured, cannot be dialyzed as an outpatient for acute kidney injury.  He will have to remain in to the hospital until 1 or 2 things happen either his kidney function improved or we declare him end-stage renal disease  Assessment/Plan:   Acute kidney injury likely due to pigment induced injury: Urine output is slowly improving. Patient is uninsured so until he declares himself end-stage renal disease for insurance approval or his renal function improves he will need to stay in the hospital Further management per nephrology.  He will have to stay in the hospital as he uninsured until we can declare end-stage renal disease.  Hyperkalemia: Likely due to rhabdomyolysis associated acute kidney injury improved with HD.  Nontraumatic rhabdomyolysis: Secondary to prolonged dependent posturing following drug use.  Left leg weakness/sciatic neuralgic pain: Sign in the setting of prolonged time down. Physical therapy evaluated the patient and recommended CIR. Continue gabapentin.  Hyponatremia: Continue further management per renal.  Hypocalcemia: Corrected calcium appears to be around 9. PTH is elevated with a low vitamin D. He was started on calcium and vitamin  D replacement therapy.  Elevated troponin/demand ischemia: In setting of rhabdomyolysis denies any chest pain or shortness of breath.  Voluntary/involuntary movement/nonorganic seizures: EEG was negative for seizure disorder. Gabapentin has been discontinued. Question what is his gain  Cocaine abuse: Counseling.  Leukocytosis: Likely reactive due to rhabdomyolysis.  Blood cultures remain negative till date.   DVT prophylaxis: lovenox Family Communication:neon Status is: Inpatient  Remains inpatient appropriate because:Hemodynamically unstable  Dispo: The patient is from: Home              Anticipated d/c is to: Home              Patient currently is not medically stable to d/c.   Difficult to place patient No      Code Status:     Code Status Orders  (From admission, onward)           Start     Ordered   09/29/20 0015  Full code  Continuous        09/29/20 0019           Code Status History     This patient has a current code status but no historical code status.         IV Access:   Peripheral IV   Procedures and diagnostic studies:   No results found.   Medical Consultants:   None.   Subjective:    [name] no complains  Objective:    Vitals:   10/11/20 1911 10/11/20 2000 10/11/20 2316 10/12/20 0324  BP: 135/70  136/76 (!) 146/83  Pulse: (!) 108 92 85 94  Resp: 12 13 13 15   Temp:  98.7 F (37.1 C)  98.7 F (37.1 C) 99.4 F (37.4 C)  TempSrc: Oral  Oral Oral  SpO2: 92% 93% 90% 93%  Weight:      Height:       SpO2: 93 % O2 Flow Rate (L/min): 2 L/min   Intake/Output Summary (Last 24 hours) at 10/12/2020 0733 Last data filed at 10/11/2020 2207 Gross per 24 hour  Intake 120 ml  Output 550 ml  Net -430 ml    Filed Weights   10/07/20 0309 10/07/20 1347 10/08/20 0222  Weight: 72.5 kg 74 kg 72.9 kg    Exam: General exam: In no acute distress. Respiratory system: Good air movement and clear to  auscultation. Cardiovascular system: S1 & S2 heard, RRR. No JVD. Gastrointestinal system: Abdomen is nondistended, soft and nontender.  Extremities: No pedal edema. Skin: No rashes, lesions or ulcers   Data Reviewed:    Labs: Basic Metabolic Panel: Recent Labs  Lab 10/08/20 0412 10/09/20 0658 10/10/20 0345 10/11/20 0423 10/12/20 0227  NA 132* 129* 134* 131* 130*  K 4.9 5.4* 4.9 4.6 4.7  CL 94* 93* 98 94* 93*  CO2 25 22 24 23 22   GLUCOSE 98 102* 112* 90 96  BUN 56* 80* 44* 64* 87*  CREATININE 7.71* 10.58* 7.11* 9.73* 11.26*  CALCIUM 8.1* 7.9* 8.2* 8.4* 8.4*  PHOS 6.5* 7.7* 6.1* 7.7* 8.3*    GFR Estimated Creatinine Clearance: 7.1 mL/min (A) (by C-G formula based on SCr of 11.26 mg/dL (H)). Liver Function Tests: Recent Labs  Lab 10/06/20 0704 10/07/20 0311 10/08/20 0412 10/09/20 0658 10/10/20 0345 10/11/20 0423 10/12/20 0227  AST 259* 194* 150*  --  115* 82*  --   ALT 207* 176* 118*  --  53* 37  --   ALKPHOS 36* 40 45  --  47 40  --   BILITOT 0.7 0.7 0.7  --  0.7 0.6  --   PROT 4.8* 4.6* 4.7*  --  5.1* 5.0*  --   ALBUMIN 2.2*  2.2* 2.1*  2.1* 2.2*  2.1* 2.2* 2.3*  2.3* 2.2*  2.2* 2.4*    No results for input(s): LIPASE, AMYLASE in the last 168 hours. No results for input(s): AMMONIA in the last 168 hours. Coagulation profile No results for input(s): INR, PROTIME in the last 168 hours. COVID-19 Labs  No results for input(s): DDIMER, FERRITIN, LDH, CRP in the last 72 hours.  Lab Results  Component Value Date   SARSCOV2NAA NEGATIVE 09/28/2020    CBC: Recent Labs  Lab 10/06/20 0704  WBC 11.7*  NEUTROABS 7.2  HGB 9.7*  HCT 27.1*  MCV 88.6  PLT 189    Cardiac Enzymes: Recent Labs  Lab 10/08/20 0412 10/09/20 0658 10/10/20 0345 10/11/20 0423 10/12/20 0227  CKTOTAL 6,328* 3,879* 2,809* 1,697* 1,500*    BNP (last 3 results) No results for input(s): PROBNP in the last 8760 hours. CBG: No results for input(s): GLUCAP in the last 168  hours. D-Dimer: No results for input(s): DDIMER in the last 72 hours. Hgb A1c: No results for input(s): HGBA1C in the last 72 hours. Lipid Profile: No results for input(s): CHOL, HDL, LDLCALC, TRIG, CHOLHDL, LDLDIRECT in the last 72 hours. Thyroid function studies: No results for input(s): TSH, T4TOTAL, T3FREE, THYROIDAB in the last 72 hours.  Invalid input(s): FREET3 Anemia work up: No results for input(s): VITAMINB12, FOLATE, FERRITIN, TIBC, IRON, RETICCTPCT in the last 72 hours. Sepsis Labs: Recent Labs  Lab 10/06/20 0704  WBC 11.7*  Microbiology No results found for this or any previous visit (from the past 240 hour(s)).    Medications:    calcium acetate  1,334 mg Oral TID WC   Chlorhexidine Gluconate Cloth  6 each Topical Q0600   docusate sodium  100 mg Oral BID   heparin  5,000 Units Subcutaneous Q8H   polyethylene glycol  17 g Oral Daily   sodium zirconium cyclosilicate  10 g Oral Daily   Vitamin D (Ergocalciferol)  50,000 Units Oral Q7 days   Continuous Infusions:      LOS: 14 days   Marinda Elk  [facility]  10/12/2020, 7:33 AM

## 2020-10-12 NOTE — Progress Notes (Signed)
Patient ID: Brett Butler, male   DOB: 10/11/1965, 55 y.o.   MRN: [id number] S:  Complaining of left hip pain.  HD nurse noted episodes of tachycardia. O:BP (!) 146/83 (BP Location: Left Arm)   Pulse 94   Temp 99.4 F (37.4 C) (Oral)   Resp 15   Ht 5\' 8"  (1.727 m)   Wt 72.9 kg   SpO2 93%   BMI 24.44 kg/m   Intake/Output Summary (Last 24 hours) at 10/12/2020 0900 Last data filed at 10/11/2020 2207 Gross per 24 hour  Intake 120 ml  Output 450 ml  Net -330 ml   Intake/Output: I/O last 3 completed shifts: In: 120 [P.O.:120] Out: 850 [Urine:850]  Intake/Output this shift:  No intake/output data recorded. Weight change:  Gen:NAD CVS: IRR IRR Resp:CTA Abd:+BS, soft, NT/ND Ext: 1+ edema of LLE  Recent Labs  Lab 10/06/20 0704 10/07/20 0311 10/08/20 0412 10/09/20 0658 10/10/20 0345 10/11/20 0423 10/12/20 0227  NA 128* 127* 132* 129* 134* 131* 130*  K 5.3* 5.7* 4.9 5.4* 4.9 4.6 4.7  CL 93* 90* 94* 93* 98 94* 93*  CO2 24 23 25 22 24 23 22   GLUCOSE 95 112* 98 102* 112* 90 96  BUN 70* 94* 56* 80* 44* 64* 87*  CREATININE 9.49* 11.29* 7.71* 10.58* 7.11* 9.73* 11.26*  ALBUMIN 2.2*  2.2* 2.1*  2.1* 2.2*  2.1* 2.2* 2.3*  2.3* 2.2*  2.2* 2.4*  CALCIUM 7.8* 7.9* 8.1* 7.9* 8.2* 8.4* 8.4*  PHOS 6.7* 8.1* 6.5* 7.7* 6.1* 7.7* 8.3*  AST 259* 194* 150*  --  115* 82*  --   ALT 207* 176* 118*  --  53* 37  --    Liver Function Tests: Recent Labs  Lab 10/08/20 0412 10/09/20 0658 10/10/20 0345 10/11/20 0423 10/12/20 0227  AST 150*  --  115* 82*  --   ALT 118*  --  53* 37  --   ALKPHOS 45  --  47 40  --   BILITOT 0.7  --  0.7 0.6  --   PROT 4.7*  --  5.1* 5.0*  --   ALBUMIN 2.2*  2.1*   < > 2.3*  2.3* 2.2*  2.2* 2.4*   < > = values in this interval not displayed.   No results for input(s): LIPASE, AMYLASE in the last 168 hours. No results for input(s): AMMONIA in the last 168 hours. CBC: Recent Labs  Lab 10/06/20 0704 10/12/20 0227  WBC 11.7* 13.9*  NEUTROABS 7.2   --   HGB 9.7* 7.1*  HCT 27.1* 20.8*  MCV 88.6 94.1  PLT 189 329   Cardiac Enzymes: Recent Labs  Lab 10/08/20 0412 10/09/20 0658 10/10/20 0345 10/11/20 0423 10/12/20 0227  CKTOTAL 6,328* 3,879* 2,809* 1,697* 1,500*   CBG: No results for input(s): GLUCAP in the last 168 hours.  Iron Studies: No results for input(s): IRON, TIBC, TRANSFERRIN, FERRITIN in the last 72 hours. Studies/Results: No results found.  calcium acetate  1,334 mg Oral TID WC   Chlorhexidine Gluconate Cloth  6 each Topical Q0600   docusate sodium  100 mg Oral BID   heparin  5,000 Units Subcutaneous Q8H   polyethylene glycol  17 g Oral Daily   sodium zirconium cyclosilicate  10 g Oral Daily   Vitamin D (Ergocalciferol)  50,000 Units Oral Q7 days    BMET    Component Value Date/Time   NA 130 (L) 10/12/2020 0227   K 4.7 10/12/2020 0227   CL 93 (  L) 10/12/2020 0227   CO2 22 10/12/2020 0227   GLUCOSE 96 10/12/2020 0227   BUN 87 (H) 10/12/2020 0227   CREATININE 11.26 (H) 10/12/2020 0227   CALCIUM 8.4 (L) 10/12/2020 0227   GFRNONAA 5 (L) 10/12/2020 0227   GFRAA >60 07/30/2019 2135   CBC    Component Value Date/Time   WBC 13.9 (H) 10/12/2020 0227   RBC 2.21 (L) 10/12/2020 0227   HGB 7.1 (L) 10/12/2020 0227   HCT 20.8 (L) 10/12/2020 0227   PLT 329 10/12/2020 0227   MCV 94.1 10/12/2020 0227   MCH 32.1 10/12/2020 0227   MCHC 34.1 10/12/2020 0227   RDW 13.8 10/12/2020 0227   LYMPHSABS 1.9 10/06/2020 0704   MONOABS 1.5 (H) 10/06/2020 0704   EOSABS 0.7 (H) 10/06/2020 0704   BASOSABS 0.1 10/06/2020 0704      Assessment/Plan:   AKI due to rhabdomyolysis - oliguric, started HD 10/01/20.  Some increase in UOP but has been dialysis dependent so far.  He is on TTS schedule and is not candidate for outpatient AKI due to lack of insurance.  Will continue with inpatient care for now and follow for return of renal function. Tachycardia - HR variable up to 130's, asymptomatic, IRR IRR concerning for new atrial  fib.  Will order ECG.  Reduce UF goal and bfr. Rhabdomyolysis - nontraumatic due to prolonged lack of consciousness related to drug use. Polysubstance abuse - counsel  HTN - stable Hyperphosphatemia - on calcium acetate as binder. Vascular access - RIJ TDC placed on 10/07/20 Disposition - possible CIR  Irena Cords, MD [facility] [phone]

## 2020-10-13 ENCOUNTER — Inpatient Hospital Stay (HOSPITAL_COMMUNITY): Payer: Medicaid Other

## 2020-10-13 LAB — RENAL FUNCTION PANEL
Albumin: 2.2 g/dL — ABNORMAL LOW (ref 3.5–5.0)
Anion gap: 13 (ref 5–15)
BUN: 50 mg/dL — ABNORMAL HIGH (ref 6–20)
CO2: 23 mmol/L (ref 22–32)
Calcium: 8.1 mg/dL — ABNORMAL LOW (ref 8.9–10.3)
Chloride: 96 mmol/L — ABNORMAL LOW (ref 98–111)
Creatinine, Ser: 7.66 mg/dL — ABNORMAL HIGH (ref 0.61–1.24)
GFR, Estimated: 8 mL/min — ABNORMAL LOW (ref >60)
Glucose, Bld: 95 mg/dL (ref 70–99)
Phosphorus: 6.1 mg/dL — ABNORMAL HIGH (ref 2.5–4.6)
Potassium: 3.8 mmol/L (ref 3.5–5.1)
Sodium: 132 mmol/L — ABNORMAL LOW (ref 135–145)

## 2020-10-13 LAB — TYPE AND SCREEN
ABO/RH(D): A POS
Antibody Screen: NEGATIVE
Unit division: 0
Unit division: 0

## 2020-10-13 LAB — CBC
HCT: 24.3 % — ABNORMAL LOW (ref 39.0–52.0)
Hemoglobin: 8.5 g/dL — ABNORMAL LOW (ref 13.0–17.0)
MCH: 30.7 pg (ref 26.0–34.0)
MCHC: 35 g/dL (ref 30.0–36.0)
MCV: 87.7 fL (ref 80.0–100.0)
Platelets: 337 10*3/uL (ref 150–400)
RBC: 2.77 MIL/uL — ABNORMAL LOW (ref 4.22–5.81)
RDW: 14.8 % (ref 11.5–15.5)
WBC: 13.2 10*3/uL — ABNORMAL HIGH (ref 4.0–10.5)
nRBC: 0 % (ref 0.0–0.2)

## 2020-10-13 LAB — BPAM RBC
Blood Product Expiration Date: 202209252359
Blood Product Expiration Date: 202209262359
ISSUE DATE / TIME: 202208302055
ISSUE DATE / TIME: 202208302354
Unit Type and Rh: 6200
Unit Type and Rh: 6200

## 2020-10-13 LAB — HEPATIC FUNCTION PANEL
ALT: 27 U/L (ref 0–44)
AST: 57 U/L — ABNORMAL HIGH (ref 15–41)
Albumin: 2.2 g/dL — ABNORMAL LOW (ref 3.5–5.0)
Alkaline Phosphatase: 38 U/L (ref 38–126)
Bilirubin, Direct: 0.1 mg/dL (ref 0.0–0.2)
Indirect Bilirubin: 0.7 mg/dL (ref 0.3–0.9)
Total Bilirubin: 0.8 mg/dL (ref 0.3–1.2)
Total Protein: 5.2 g/dL — ABNORMAL LOW (ref 6.5–8.1)

## 2020-10-13 LAB — CK: Total CK: 899 U/L — ABNORMAL HIGH (ref 49–397)

## 2020-10-13 NOTE — Progress Notes (Signed)
Patient ID: Brett Butler, male   DOB: 01-Sep-1965, 55 y.o.   MRN: [id number] S:Feels well today but reports constipation. O:BP 113/69 (BP Location: Right Arm)   Pulse 71   Temp 98.3 F (36.8 C) (Oral)   Resp 14   Ht 5\' 8"  (1.727 m)   Wt 80.6 kg   SpO2 97%   BMI 27.02 kg/m   Intake/Output Summary (Last 24 hours) at 10/13/2020 1240 Last data filed at 10/13/2020 0916 Gross per 24 hour  Intake 1383.5 ml  Output 710 ml  Net 673.5 ml   Intake/Output: I/O last 3 completed shifts: In: 1103.5 [P.O.:370; I.V.:95.5; Blood:638] Out: 701 [Urine:980]  Intake/Output this shift:  Total I/O In: 400 [P.O.:400] Out: 400 [Urine:400] Weight change:  Gen:NAD CVS: RRR Resp: CTA Abd: +BS, soft, NT/Nd Ext: 1+ edema of RLE  Recent Labs  Lab 10/07/20 0311 10/08/20 0412 10/09/20 0658 10/10/20 0345 10/11/20 0423 10/12/20 0227 10/12/20 1353 10/13/20 0619  NA 127* 132* 129* 134* 131* 130* 134* 132*  K 5.7* 4.9 5.4* 4.9 4.6 4.7 3.3* 3.8  CL 90* 94* 93* 98 94* 93* 97* 96*  CO2 23 25 22 24 23 22 28 23   GLUCOSE 112* 98 102* 112* 90 96 120* 95  BUN 94* 56* 80* 44* 64* 87* 34* 50*  CREATININE 11.29* 7.71* 10.58* 7.11* 9.73* 11.26* 5.97* 7.66*  ALBUMIN 2.1*  2.1* 2.2*  2.1* 2.2* 2.3*  2.3* 2.2*  2.2* 2.4* 2.2* 2.2*  2.2*  CALCIUM 7.9* 8.1* 7.9* 8.2* 8.4* 8.4* 8.3* 8.1*  PHOS 8.1* 6.5* 7.7* 6.1* 7.7* 8.3* 4.4 6.1*  AST 194* 150*  --  115* 82*  --   --  57*  ALT 176* 118*  --  53* 37  --   --  27   Liver Function Tests: Recent Labs  Lab 10/10/20 0345 10/11/20 0423 10/12/20 0227 10/12/20 1353 10/13/20 0619  AST 115* 82*  --   --  57*  ALT 53* 37  --   --  27  ALKPHOS 47 40  --   --  38  BILITOT 0.7 0.6  --   --  0.8  PROT 5.1* 5.0*  --   --  5.2*  ALBUMIN 2.3*  2.3* 2.2*  2.2* 2.4* 2.2* 2.2*  2.2*   No results for input(s): LIPASE, AMYLASE in the last 168 hours. No results for input(s): AMMONIA in the last 168 hours. CBC: Recent Labs  Lab 10/12/20 0227 10/12/20 1353  10/13/20 1037  WBC 13.9* 13.2* 13.2*  HGB 7.1* 6.4* 8.5*  HCT 20.8* 18.7* 24.3*  MCV 94.1 92.1 87.7  PLT 329 340 337   Cardiac Enzymes: Recent Labs  Lab 10/09/20 0658 10/10/20 0345 10/11/20 0423 10/12/20 0227 10/13/20 0619  CKTOTAL 3,879* 2,809* 1,697* 1,500* 899*   CBG: No results for input(s): GLUCAP in the last 168 hours.  Iron Studies: No results for input(s): IRON, TIBC, TRANSFERRIN, FERRITIN in the last 72 hours. Studies/Results: ECHOCARDIOGRAM COMPLETE  Result Date: 10/12/2020    ECHOCARDIOGRAM REPORT   Patient Name:   Brett Butler Date of Exam: 10/12/2020 Medical Rec #:  Marcell Barlow      Height:       68.0 in Accession #:    10/14/2020     Weight:       177.7 lb Date of Birth:  03/09/1965     BSA:          1.944 m Patient Age:    69 years  BP:           111/61 mmHg Patient Gender: M              HR:           83 bpm. Exam Location:  Inpatient Procedure: 2D Echo, Cardiac Doppler, Color Doppler and 3D Echo Indications:    Atrial Fibrillation I48.91  History:        Patient has no prior history of Echocardiogram examinations.                 Polysubstance abuse. Acute kidney injury, Elevated                 troponin/demand ischemia, Leukocytosis due to rhabdomyolysis.  Sonographer:    Leta Junglingiffany Cooper RDCS Referring Phys: [id number] ABRAHAM FELIZ ORTIZ IMPRESSIONS  1. Left ventricular ejection fraction, by estimation, is 60 to 65%. Left ventricular ejection fraction by 3D volume is 67 %. The left ventricle has normal function. The left ventricle has no regional wall motion abnormalities. Left ventricular diastolic  parameters were normal.  2. Right ventricular systolic function is normal. The right ventricular size is normal.  3. The mitral valve is normal in structure. No evidence of mitral valve regurgitation. No evidence of mitral stenosis.  4. The aortic valve is normal in structure. Aortic valve regurgitation is not visualized. No aortic stenosis is present.  5. There is borderline  dilatation of the aortic root, measuring 39 mm.  6. The inferior vena cava is normal in size with greater than 50% respiratory variability, suggesting right atrial pressure of 3 mmHg. FINDINGS  Left Ventricle: Left ventricular ejection fraction, by estimation, is 60 to 65%. Left ventricular ejection fraction by 3D volume is 67 %. The left ventricle has normal function. The left ventricle has no regional wall motion abnormalities. The left ventricular internal cavity size was normal in size. There is no left ventricular hypertrophy. Left ventricular diastolic parameters were normal. Normal left ventricular filling pressure. Right Ventricle: The right ventricular size is normal. No increase in right ventricular wall thickness. Right ventricular systolic function is normal. Left Atrium: Left atrial size was normal in size. Right Atrium: Right atrial size was normal in size. Pericardium: There is no evidence of pericardial effusion. Mitral Valve: The mitral valve is normal in structure. No evidence of mitral valve regurgitation. No evidence of mitral valve stenosis. Tricuspid Valve: The tricuspid valve is normal in structure. Tricuspid valve regurgitation is not demonstrated. No evidence of tricuspid stenosis. Aortic Valve: The aortic valve is normal in structure. Aortic valve regurgitation is not visualized. No aortic stenosis is present. Pulmonic Valve: The pulmonic valve was normal in structure. Pulmonic valve regurgitation is not visualized. No evidence of pulmonic stenosis. Aorta: The aortic root is normal in size and structure. There is borderline dilatation of the aortic root, measuring 39 mm. Venous: The inferior vena cava is normal in size with greater than 50% respiratory variability, suggesting right atrial pressure of 3 mmHg. IAS/Shunts: No atrial level shunt detected by color flow Doppler.  LEFT VENTRICLE PLAX 2D LVIDd:         5.10 cm         Diastology LVIDs:         3.50 cm         LV e' medial:    11.30  cm/s LV PW:         0.80 cm         LV E/e' medial:  6.0 LV  IVS:        0.80 cm         LV e' lateral:   18.50 cm/s LVOT diam:     2.30 cm         LV E/e' lateral: 3.7 LV SV:         72 LV SV Index:   37 LVOT Area:     4.15 cm        3D Volume EF                                LV 3D EF:    Left                                             ventricul                                             ar                                             ejection                                             fraction                                             by 3D                                             volume is                                             67 %.                                 3D Volume EF:                                3D EF:        67 %                                LV EDV:       195 ml                                LV ESV:       63 ml  LV SV:        131 ml RIGHT VENTRICLE TAPSE (M-mode): 1.9 cm LEFT ATRIUM             Index       RIGHT ATRIUM           Index LA diam:        3.30 cm 1.70 cm/m  RA Area:     10.90 cm LA Vol (A2C):   29.1 ml 14.97 ml/m RA Volume:   19.10 ml  9.83 ml/m LA Vol (A4C):   35.4 ml 18.21 ml/m LA Biplane Vol: 32.3 ml 16.62 ml/m  AORTIC VALVE LVOT Vmax:   94.60 cm/s LVOT Vmean:  61.600 cm/s LVOT VTI:    0.174 m  AORTA Ao Root diam: 3.90 cm MITRAL VALVE MV Area (PHT): 3.89 cm    SHUNTS MV Decel Time: 195 msec    Systemic VTI:  0.17 m MV E velocity: 67.90 cm/s  Systemic Diam: 2.30 cm MV A velocity: 52.10 cm/s MV E/A ratio:  1.30 Mihai Croitoru MD Electronically signed by Thurmon Fair MD Signature Date/Time: 10/12/2020/5:28:38 PM    Final     sodium chloride   Intravenous Once   calcium acetate  1,334 mg Oral TID WC   Chlorhexidine Gluconate Cloth  6 each Topical Q0600   docusate sodium  100 mg Oral BID   heparin  5,000 Units Subcutaneous Q8H   metoprolol tartrate  25 mg Oral BID   polyethylene glycol  17 g Oral Daily   Vitamin D  (Ergocalciferol)  50,000 Units Oral Q7 days    BMET    Component Value Date/Time   NA 132 (L) 10/13/2020 0619   K 3.8 10/13/2020 0619   CL 96 (L) 10/13/2020 0619   CO2 23 10/13/2020 0619   GLUCOSE 95 10/13/2020 0619   BUN 50 (H) 10/13/2020 0619   CREATININE 7.66 (H) 10/13/2020 0619   CALCIUM 8.1 (L) 10/13/2020 0619   GFRNONAA 8 (L) 10/13/2020 0619   GFRAA >60 07/30/2019 2135   CBC    Component Value Date/Time   WBC 13.2 (H) 10/13/2020 1037   RBC 2.77 (L) 10/13/2020 1037   HGB 8.5 (L) 10/13/2020 1037   HCT 24.3 (L) 10/13/2020 1037   PLT 337 10/13/2020 1037   MCV 87.7 10/13/2020 1037   MCH 30.7 10/13/2020 1037   MCHC 35.0 10/13/2020 1037   RDW 14.8 10/13/2020 1037   LYMPHSABS 1.9 10/06/2020 0704   MONOABS 1.5 (H) 10/06/2020 0704   EOSABS 0.7 (H) 10/06/2020 0704   BASOSABS 0.1 10/06/2020 0704    Assessment/Plan:   AKI due to rhabdomyolysis - oliguric, started HD 10/01/20.  Some increase in UOP but has been dialysis dependent so far.  He is on TTS schedule and is not candidate for outpatient AKI due to lack of insurance.  Will continue with inpatient care for now and follow for return of renal function. UOP increasing to 980 overnight, hopefully seeing renal recovery Plan for HD again tomorrow but keep even given increased UOP. Tachycardia - HR variable up to 130's, asymptomatic, IRR IRR concerning for new atrial fib.  Will order ECG.  Reduce UF goal and bfr. Rhabdomyolysis - nontraumatic due to prolonged lack of consciousness related to drug use. Polysubstance abuse - counsel  HTN - stable Constipation - start miralax and follow. Hyperphosphatemia - on calcium acetate as binder. Vascular access - RIJ TDC placed on 10/07/20 Disposition - possible CIR  Irena Cords, MD [facility] [phone]

## 2020-10-13 NOTE — Progress Notes (Signed)
Physical Therapy Treatment Patient Details Name: Brett Butler MRN: [id number] DOB: 1966-01-16 Today's Date: 10/13/2020    History of Present Illness Pt is a 55 y.o. male who presented 09/28/20 with lethargy s/p fall after cocaine use. pt found to have ARF and L lower extremity myositis with rhabdomyolosis and possible sciatic nerve damage. PMH: polysubstance abuse    PT Comments    Patient self limiting today due to LLE pain and need for bowel movement. Patient agreeable to room ambulation. Patient required min guard for slowed pace ambulation in room with RW. Difficulty with backwards stepping with L LE. Patient continues to  be limited by impaired cognition. D/c plan remains appropriate.     Follow Up Recommendations  Supervision for mobility/OOB;Outpatient PT;Home health PT (charity HHPT if able to receive)     Equipment Recommendations  Rolling Erling Arrazola with 5" wheels;Wheelchair (measurements PT);Wheelchair cushion (measurements PT);3in1 (PT)    Recommendations for Other Services       Precautions / Restrictions Precautions Precautions: Fall Precaution Comments: back precautions to manage pain Restrictions Weight Bearing Restrictions: No    Mobility  Bed Mobility Overal bed mobility: Needs Assistance Bed Mobility: Supine to Sit     Supine to sit: Supervision     General bed mobility comments: supervision for safety.    Transfers Overall transfer level: Needs assistance Equipment used: Rolling Breonia Kirstein (2 wheeled) Transfers: Sit to/from Stand Sit to Stand: Min guard         General transfer comment: increase time to complete. Cues for hand placement  Ambulation/Gait Ambulation/Gait assistance: Min guard Gait Distance (Feet): 20 Feet Assistive device: Rolling Emaad Nanna (2 wheeled) Gait Pattern/deviations: Step-to pattern;Decreased step length - left;Decreased weight shift to left;Decreased dorsiflexion - left;Decreased stride length;Trunk flexed Gait velocity:  decreased   General Gait Details: very slow pace. Difficulty with backwards stepping with L foot due to L LE weakness/pain. Improved weightbearing through L LE. No AFO present in room at time of treatment session   Stairs             Wheelchair Mobility    Modified Rankin (Stroke Patients Only) Modified Rankin (Stroke Patients Only) Pre-Morbid Rankin Score: No symptoms Modified Rankin: Moderately severe disability     Balance Overall balance assessment: Needs assistance Sitting-balance support: No upper extremity supported;Feet supported Sitting balance-Leahy Scale: Good     Standing balance support: Bilateral upper extremity supported;During functional activity Standing balance-Leahy Scale: Poor Standing balance comment: reliant on UE support and external assist                            Cognition Arousal/Alertness: Awake/alert Behavior During Therapy: Flat affect;Anxious Overall Cognitive Status: No family/caregiver present to determine baseline cognitive functioning                                        Exercises      General Comments        Pertinent Vitals/Pain Pain Assessment: Faces Faces Pain Scale: Hurts even more Pain Location: posterior L thigh Pain Descriptors / Indicators: Discomfort;Grimacing;Guarding Pain Intervention(s): Limited activity within patient's tolerance;Monitored during session;Repositioned    Home Living                      Prior Function            PT Goals (current goals can  now be found in the care plan section) Acute Rehab PT Goals Patient Stated Goal: return home and remain independent with ADLs PT Goal Formulation: With patient Time For Goal Achievement: 10/14/20 Potential to Achieve Goals: Good Progress towards PT goals: Progressing toward goals    Frequency    Min 3X/week      PT Plan Current plan remains appropriate    Co-evaluation              AM-PAC PT  "6 Clicks" Mobility   Outcome Measure  Help needed turning from your back to your side while in a flat bed without using bedrails?: A Little Help needed moving from lying on your back to sitting on the side of a flat bed without using bedrails?: A Little Help needed moving to and from a bed to a chair (including a wheelchair)?: A Little Help needed standing up from a chair using your arms (e.g., wheelchair or bedside chair)?: A Little Help needed to walk in hospital room?: A Little Help needed climbing 3-5 steps with a railing? : A Little 6 Click Score: 18    End of Session Equipment Utilized During Treatment: Gait belt Activity Tolerance: Patient tolerated treatment well Patient left: in chair;with chair alarm set;with call bell/phone within reach Nurse Communication: Mobility status PT Visit Diagnosis: Other abnormalities of gait and mobility (R26.89);Unsteadiness on feet (R26.81);Muscle weakness (generalized) (M62.81);History of falling (Z91.81);Difficulty in walking, not elsewhere classified (R26.2);Other symptoms and signs involving the nervous system (R29.898);Pain Pain - Right/Left: Left Pain - part of body: Hip     Time: 1191-4782 PT Time Calculation (min) (ACUTE ONLY): 27 min  Charges:  $Gait Training: 23-37 mins                     Ceceilia Cephus A. Dan Humphreys PT, DPT Acute Rehabilitation Services Pager [phone] Office [phone]    Viviann Spare 10/13/2020, 4:55 PM

## 2020-10-13 NOTE — Progress Notes (Signed)
Occupational Therapy Treatment Patient Details Name: Brett Butler MRN: [id number] DOB: 12/18/1965 Today's Date: 10/13/2020    History of present illness Pt is a 55 y.o. male who presented 09/28/20 with lethargy s/p fall after cocaine use. pt found to have ARF and L lower extremity myositis with rhabdomyolosis and possible sciatic nerve damage. PMH: polysubstance abuse   OT comments  Pt progressing overall towards acute OT goals. Pt c/o 6/10 FACEs L posterior thigh pain at rest. Pt also limited by fatigue and decreased activity tolerance this session. Noted that last Hgb reading yesterday was 6.4. Pt now s/p blood transfusion. Lab collection taken during OT session. Pt back in bed at end of session. D/c plan remains appropriate.    Follow Up Recommendations  Home health OT;Outpatient OT;Supervision - Intermittent    Equipment Recommendations  3 in 1 bedside commode;Wheelchair (measurements OT);Wheelchair cushion (measurements OT)    Recommendations for Other Services      Precautions / Restrictions Precautions Precautions: Fall Precaution Comments: back precautions to manage pain Restrictions Weight Bearing Restrictions: No       Mobility Bed Mobility Overal bed mobility: Modified Independent Bed Mobility: Supine to Sit;Sit to Supine     Supine to sit: Min guard Sit to supine: Supervision   General bed mobility comments: HOB elevated. decreased intitation. Intitally indiacting that he could not move his LLE. Better command following with functional cues and extra time.    Transfers Overall transfer level: Needs assistance Equipment used: Rolling walker (2 wheeled) Transfers: Sit to/from Stand Sit to Stand: Min guard         General transfer comment: to/from EOB. used rw. extra time and effort    Balance Overall balance assessment: Needs assistance Sitting-balance support: No upper extremity supported;Feet supported Sitting balance-Leahy Scale: Good   Postural  control: Right lateral lean Standing balance support: No upper extremity supported;Bilateral upper extremity supported Standing balance-Leahy Scale: Poor                             ADL either performed or assessed with clinical judgement   ADL Overall ADL's : Needs assistance/impaired                         Toilet Transfer: Min guard;Minimal assistance;Stand-pivot;Ambulation;BSC;RW Toilet Transfer Details (indicate cue type and reason): Pt able to walk about 5' this session before stating and initiating return to bed citing increased discomfort. Pt very focused on his goal of having a bowel movement and his solution was to try to stay in bed with the bed pan. Educated on the role of mobility in digestive health and reducing constipation. Pt unsure of this idea.           General ADL Comments: Attempted to walk bathroom distance but pt ended OOB activity after about 5' and inititated return to bed. Up to min A for stedying and clearing osbtacles in the environment.     Vision       Perception     Praxis      Cognition Arousal/Alertness: Awake/alert Behavior During Therapy: Flat affect;Anxious Overall Cognitive Status: No family/caregiver present to determine baseline cognitive functioning                                 General Comments: some internally inconsistent statements vs STM deficit?  Exercises     Shoulder Instructions       General Comments Last Hgb reading yesterday was 6.4. Pt now s/p blood transfusion.Lab collection taken during OT session.    Pertinent Vitals/ Pain       Pain Assessment: Faces Faces Pain Scale: Hurts even more Pain Location: posterior L thigh Pain Descriptors / Indicators: Discomfort;Grimacing;Guarding Pain Intervention(s): Limited activity within patient's tolerance;Monitored during session;Repositioned  Home Living                                          Prior  Functioning/Environment              Frequency  Min 2X/week        Progress Toward Goals  OT Goals(current goals can now be found in the care plan section)  Progress towards OT goals: Progressing toward goals  Acute Rehab OT Goals Patient Stated Goal: return home and remain independent with ADLs OT Goal Formulation: With patient Time For Goal Achievement: 10/16/20 Potential to Achieve Goals: Good ADL Goals Pt Will Perform Grooming: with min guard assist;standing Pt Will Perform Lower Body Bathing: with min assist;sitting/lateral leans;sit to/from stand Pt Will Perform Lower Body Dressing: with min assist;with adaptive equipment;sitting/lateral leans;sit to/from stand Pt Will Perform Toileting - Clothing Manipulation and hygiene: with min assist;sitting/lateral leans;sit to/from stand Pt Will Perform Tub/Shower Transfer: Tub transfer;Shower transfer;with min assist;shower seat;grab bars;rolling walker;ambulating Pt/caregiver will Perform Home Exercise Program: Increased strength;Both right and left upper extremity;Independently Additional ADL Goal #1: Pt will verbalize/demonstrate 3 fall prevention techniques to incorporate into ADLs/ADL mobility to increase safety awareness with min assist-mod I overall. Additional ADL Goal #2: Pt will verbalize/demonstrate 3 energy conservation techniques to incorporate into ADLs/ADL mobility to increase activity tolerance with min assist-mod I overall.  Plan Discharge plan remains appropriate    Co-evaluation                 AM-PAC OT "6 Clicks" Daily Activity     Outcome Measure   Help from another person eating meals?: None Help from another person taking care of personal grooming?: A Little Help from another person toileting, which includes using toliet, bedpan, or urinal?: A Little Help from another person bathing (including washing, rinsing, drying)?: A Little Help from another person to put on and taking off regular upper  body clothing?: A Little Help from another person to put on and taking off regular lower body clothing?: A Lot 6 Click Score: 18    End of Session Equipment Utilized During Treatment: Rolling walker  OT Visit Diagnosis: Unsteadiness on feet (R26.81);Muscle weakness (generalized) (M62.81);Pain Pain - Right/Left: Left Pain - part of body: Leg   Activity Tolerance Patient limited by pain;Patient limited by fatigue   Patient Left in bed;with call bell/phone within reach   Nurse Communication          Time: 9211-9417 OT Time Calculation (min): 29 min  Charges: OT General Charges $OT Visit: 1 Visit OT Treatments $Self Care/Home Management : 23-37 mins  Brett Butler, OT Acute Rehabilitation Services Pager: [phone] Office: [phone]    Brett Butler 10/13/2020, 11:09 AM

## 2020-10-13 NOTE — Progress Notes (Signed)
PROGRESS NOTE    Brett Butler  HAL:[id number] DOB: 10-Oct-1965 DOA: 09/28/2020 PCP: Lavinia Sharps, NP   Brief Narrative: Brett Butler is a 55 y.o. male with a history of polysubstance use including opiates, cocaine, cannabis in addition to schizoaffective disorder.  Patient presented secondary to being found to be lethargic by his friend.  Patient responded to Narcan when EMS arrived.  On arrival to the hospital, patient was noted to have a severe AKI and evidence of severe rhabdomyolysis requiring HD.    Assessment & Plan:   Principal Problem:   ARF (acute renal failure) (HCC) Active Problems:   Rhabdomyolysis   Cocaine abuse (HCC)   Hyperkalemia   Leukocytosis   Injury of left sciatic nerve   AKI Likely pigment-induced injury from rhabdomyolysis.  Baseline creatinine of about 1.  Creatinine of 4.95 on admission with continued increase.  Patient started on IV fluids at 150 ml/hr with increase to 200 ml/hr by nephrology with no improvement in creatinine or urine output. Urinalysis with significant hemoglobin/no RBCs. Temporary HD catheter placed on 8/18 and HD initiated the same day. Continues to require hemodialysis. UOP is increased; 530 mL documented over the last 24 hours, however this morning's urine output of 400 mL was from overnight. -Nephrology recommendations: Continued HD, calcium acetate for hyperphosphatemia -Strict ins/out  Rhabdomyolysis Secondary to to prolonged time down and resultant trauma.  Advanced imaging with evidence consistent with rhabdomyolysis.  With significant amount of edema noted on advanced imaging, orthopedic surgery was consulted with recommendations for no surgical management. CK trending down now. AST/ALT trending down. Resolved.  Acute anemia Unknown etiology. No evidence of bleed. Possibly related to prior diagnosed left thigh hematoma with possibly persistent bleed. Also in setting of AKI. Hemoglobin drop of 3.3 over 6 days down to 6.4  on 8/30. 2 units of PRBC transfused on 8/30. Hemoglobin up to 8.5 today. -Ultrasound of left upper thigh to assess hematoma -Daily CBC x3 days  Left leg weakness Sciatic neuropathy Neuropathic pain In setting of prolonged time down and likely sciatic nerve damage. Concern there may not be full recovery but he has had some improvement. PT/OT recommending CIR. Was given gabapentin which is now discontinued secondary to episodes of shaking concerning for gabapentin side effect.  Atrial fibrillation/flutter Patient started on diltiazem drip. Currently rate controlled. Transthoracic Echocardiogram unremarkable. -Repeat EKG -Discontinue Diltiazem  Hyperkalemia Secondary to AKI.  Peak potassium 6.7.  Associated peaked T waves.  Treated with Lokelma. Now resolved.  Hyponatremia Stable.  Hypocalcemia Correct calcium of about 9 today in setting of hypoalbuminemia. PTH elevated with low 1,25/25-vitamin D. Improved with calcium and vitamin D replacement. -Vitamin D 50,000 units weekly  Demand ischemia Elevated troponin in setting of rhabdomyolysis.  Peak troponin of 140 with delta troponin of 120.  No chest pain.  Cocaine abuse Patient states that he snorts cocaine.  He thinks that the cocaine may have been laced with fentanyl. Social work consulted.  Leukocytosis Likely reactive secondary to rhabdomyolysis.  Patient given Vancomycin, Flagyl and cefepime in the ED which have been discontinued. Blood cultures obtained on admission and are no growth to date.  Resolved.   DVT prophylaxis: SCDs Code Status:   Code Status: Full Code Family Communication: None at bedside Disposition Plan: Discharge to home with home health pending nephrology recommendations for AKI   Consultants:  Orthopedic surgery Neurology  Procedures:  None  Antimicrobials: Vancomycin IV Cefepime IV Flagyl IV    Subjective: Some trouble with urination this  morning (hesitancy) but eventually able to void. No  other concerns.  Objective: Vitals:   10/13/20 0244 10/13/20 0300 10/13/20 0752 10/13/20 1201  BP: 124/79 121/81 (!) 149/68 113/69  Pulse: 78 75 84 71  Resp: 12 14 17 14   Temp: 99.1 F (37.3 C) 99.4 F (37.4 C) 99.2 F (37.3 C) 98.3 F (36.8 C)  TempSrc: Oral Oral Oral Oral  SpO2: 93% 93% 94% 97%  Weight:      Height:        Intake/Output Summary (Last 24 hours) at 10/13/2020 1248 Last data filed at 10/13/2020 0916 Gross per 24 hour  Intake 1383.5 ml  Output 710 ml  Net 673.5 ml    Filed Weights   10/08/20 0222 10/12/20 0750 10/12/20 1133  Weight: 72.9 kg 80.6 kg 80.6 kg    Examination:  General exam: Appears calm and comfortable Respiratory system: Clear to auscultation. Respiratory effort normal. Cardiovascular system: S1 & S2 heard, RRR. No murmurs, rubs, gallops or clicks. Gastrointestinal system: Abdomen is nondistended, soft and nontender. No organomegaly or masses felt. Normal bowel sounds heard. Central nervous system: Alert and oriented. No focal neurological deficits. Musculoskeletal: left upper medial thigh significant for edema and mild tenderness. No calf tenderness Skin: No cyanosis. No rashes Psychiatry: Judgement and insight appear normal. Mood & affect appropriate.     Data Reviewed: I have personally reviewed following labs and imaging studies  CBC Lab Results  Component Value Date   WBC 13.2 (H) 10/13/2020   RBC 2.77 (L) 10/13/2020   HGB 8.5 (L) 10/13/2020   HCT 24.3 (L) 10/13/2020   MCV 87.7 10/13/2020   MCH 30.7 10/13/2020   PLT 337 10/13/2020   MCHC 35.0 10/13/2020   RDW 14.8 10/13/2020   LYMPHSABS 1.9 10/06/2020   MONOABS 1.5 (H) 10/06/2020   EOSABS 0.7 (H) 10/06/2020   BASOSABS 0.1 10/06/2020     Last metabolic panel Lab Results  Component Value Date   NA 132 (L) 10/13/2020   K 3.8 10/13/2020   CL 96 (L) 10/13/2020   CO2 23 10/13/2020   BUN 50 (H) 10/13/2020   CREATININE 7.66 (H) 10/13/2020   GLUCOSE 95 10/13/2020    GFRNONAA 8 (L) 10/13/2020   GFRAA >60 07/30/2019   CALCIUM 8.1 (L) 10/13/2020   PHOS 6.1 (H) 10/13/2020   PROT 5.2 (L) 10/13/2020   ALBUMIN 2.2 (L) 10/13/2020   ALBUMIN 2.2 (L) 10/13/2020   BILITOT 0.8 10/13/2020   ALKPHOS 38 10/13/2020   AST 57 (H) 10/13/2020   ALT 27 10/13/2020   ANIONGAP 13 10/13/2020    CBG (last 3)  No results for input(s): GLUCAP in the last 72 hours.   GFR: Estimated Creatinine Clearance: 10.4 mL/min (A) (by C-G formula based on SCr of 7.66 mg/dL (H)).  Coagulation Profile: No results for input(s): INR, PROTIME in the last 168 hours.  No results found for this or any previous visit (from the past 240 hour(s)).       Radiology Studies: ECHOCARDIOGRAM COMPLETE  Result Date: 10/12/2020    ECHOCARDIOGRAM REPORT   Patient Name:   DRAYLEN LOBUE Date of Exam: 10/12/2020 Medical Rec #:  10/14/2020      Height:       68.0 in Accession #:    [id number]     Weight:       177.7 lb Date of Birth:  12-Jun-1965     BSA:          1.944 m Patient Age:  54 years       BP:           111/61 mmHg Patient Gender: M              HR:           83 bpm. Exam Location:  Inpatient Procedure: 2D Echo, Cardiac Doppler, Color Doppler and 3D Echo Indications:    Atrial Fibrillation I48.91  History:        Patient has no prior history of Echocardiogram examinations.                 Polysubstance abuse. Acute kidney injury, Elevated                 troponin/demand ischemia, Leukocytosis due to rhabdomyolysis.  Sonographer:    Leta Junglingiffany Cooper RDCS Referring Phys: [phone] ABRAHAM FELIZ ORTIZ IMPRESSIONS  1. Left ventricular ejection fraction, by estimation, is 60 to 65%. Left ventricular ejection fraction by 3D volume is 67 %. The left ventricle has normal function. The left ventricle has no regional wall motion abnormalities. Left ventricular diastolic  parameters were normal.  2. Right ventricular systolic function is normal. The right ventricular size is normal.  3. The mitral valve is normal in  structure. No evidence of mitral valve regurgitation. No evidence of mitral stenosis.  4. The aortic valve is normal in structure. Aortic valve regurgitation is not visualized. No aortic stenosis is present.  5. There is borderline dilatation of the aortic root, measuring 39 mm.  6. The inferior vena cava is normal in size with greater than 50% respiratory variability, suggesting right atrial pressure of 3 mmHg. FINDINGS  Left Ventricle: Left ventricular ejection fraction, by estimation, is 60 to 65%. Left ventricular ejection fraction by 3D volume is 67 %. The left ventricle has normal function. The left ventricle has no regional wall motion abnormalities. The left ventricular internal cavity size was normal in size. There is no left ventricular hypertrophy. Left ventricular diastolic parameters were normal. Normal left ventricular filling pressure. Right Ventricle: The right ventricular size is normal. No increase in right ventricular wall thickness. Right ventricular systolic function is normal. Left Atrium: Left atrial size was normal in size. Right Atrium: Right atrial size was normal in size. Pericardium: There is no evidence of pericardial effusion. Mitral Valve: The mitral valve is normal in structure. No evidence of mitral valve regurgitation. No evidence of mitral valve stenosis. Tricuspid Valve: The tricuspid valve is normal in structure. Tricuspid valve regurgitation is not demonstrated. No evidence of tricuspid stenosis. Aortic Valve: The aortic valve is normal in structure. Aortic valve regurgitation is not visualized. No aortic stenosis is present. Pulmonic Valve: The pulmonic valve was normal in structure. Pulmonic valve regurgitation is not visualized. No evidence of pulmonic stenosis. Aorta: The aortic root is normal in size and structure. There is borderline dilatation of the aortic root, measuring 39 mm. Venous: The inferior vena cava is normal in size with greater than 50% respiratory  variability, suggesting right atrial pressure of 3 mmHg. IAS/Shunts: No atrial level shunt detected by color flow Doppler.  LEFT VENTRICLE PLAX 2D LVIDd:         5.10 cm         Diastology LVIDs:         3.50 cm         LV e' medial:    11.30 cm/s LV PW:         0.80 cm  LV E/e' medial:  6.0 LV IVS:        0.80 cm         LV e' lateral:   18.50 cm/s LVOT diam:     2.30 cm         LV E/e' lateral: 3.7 LV SV:         72 LV SV Index:   37 LVOT Area:     4.15 cm        3D Volume EF                                LV 3D EF:    Left                                             ventricul                                             ar                                             ejection                                             fraction                                             by 3D                                             volume is                                             67 %.                                 3D Volume EF:                                3D EF:        67 %                                LV EDV:       195 ml                                LV ESV:       63  ml                                LV SV:        131 ml RIGHT VENTRICLE TAPSE (M-mode): 1.9 cm LEFT ATRIUM             Index       RIGHT ATRIUM           Index LA diam:        3.30 cm 1.70 cm/m  RA Area:     10.90 cm LA Vol (A2C):   29.1 ml 14.97 ml/m RA Volume:   19.10 ml  9.83 ml/m LA Vol (A4C):   35.4 ml 18.21 ml/m LA Biplane Vol: 32.3 ml 16.62 ml/m  AORTIC VALVE LVOT Vmax:   94.60 cm/s LVOT Vmean:  61.600 cm/s LVOT VTI:    0.174 m  AORTA Ao Root diam: 3.90 cm MITRAL VALVE MV Area (PHT): 3.89 cm    SHUNTS MV Decel Time: 195 msec    Systemic VTI:  0.17 m MV E velocity: 67.90 cm/s  Systemic Diam: 2.30 cm MV A velocity: 52.10 cm/s MV E/A ratio:  1.30 Mihai Croitoru MD Electronically signed by Thurmon Fair MD Signature Date/Time: 10/12/2020/5:28:38 PM    Final         Scheduled Meds:  sodium chloride   Intravenous Once    calcium acetate  1,334 mg Oral TID WC   Chlorhexidine Gluconate Cloth  6 each Topical Q0600   docusate sodium  100 mg Oral BID   heparin  5,000 Units Subcutaneous Q8H   metoprolol tartrate  25 mg Oral BID   polyethylene glycol  17 g Oral Daily   Vitamin D (Ergocalciferol)  50,000 Units Oral Q7 days   Continuous Infusions:  sodium chloride     sodium chloride     diltiazem (CARDIZEM) infusion 5 mg/hr (10/13/20 1014)      LOS: 15 days     Jacquelin Hawking, MD [facility] 10/13/2020, 12:48 PM  If 7PM-7AM, please contact night-coverage [email or web address]

## 2020-10-14 LAB — RENAL FUNCTION PANEL
Albumin: 2.2 g/dL — ABNORMAL LOW (ref 3.5–5.0)
Anion gap: 17 — ABNORMAL HIGH (ref 5–15)
BUN: 61 mg/dL — ABNORMAL HIGH (ref 6–20)
CO2: 23 mmol/L (ref 22–32)
Calcium: 8.6 mg/dL — ABNORMAL LOW (ref 8.9–10.3)
Chloride: 94 mmol/L — ABNORMAL LOW (ref 98–111)
Creatinine, Ser: 9.44 mg/dL — ABNORMAL HIGH (ref 0.61–1.24)
GFR, Estimated: 6 mL/min — ABNORMAL LOW (ref >60)
Glucose, Bld: 90 mg/dL (ref 70–99)
Phosphorus: 6.7 mg/dL — ABNORMAL HIGH (ref 2.5–4.6)
Potassium: 4.1 mmol/L (ref 3.5–5.1)
Sodium: 134 mmol/L — ABNORMAL LOW (ref 135–145)

## 2020-10-14 LAB — CK: Total CK: 718 U/L — ABNORMAL HIGH (ref 49–397)

## 2020-10-14 LAB — CBC
HCT: 25.4 % — ABNORMAL LOW (ref 39.0–52.0)
HCT: 25.4 % — ABNORMAL LOW (ref 39.0–52.0)
Hemoglobin: 8.4 g/dL — ABNORMAL LOW (ref 13.0–17.0)
Hemoglobin: 8.6 g/dL — ABNORMAL LOW (ref 13.0–17.0)
MCH: 30.1 pg (ref 26.0–34.0)
MCH: 30.5 pg (ref 26.0–34.0)
MCHC: 33.1 g/dL (ref 30.0–36.0)
MCHC: 33.9 g/dL (ref 30.0–36.0)
MCV: 91 fL (ref 80.0–100.0)
MCV: 90.1 fL (ref 80.0–100.0)
Platelets: 374 10*3/uL (ref 150–400)
Platelets: 332 10*3/uL (ref 150–400)
RBC: 2.79 MIL/uL — ABNORMAL LOW (ref 4.22–5.81)
RBC: 2.82 MIL/uL — ABNORMAL LOW (ref 4.22–5.81)
RDW: 14.8 % (ref 11.5–15.5)
RDW: 14.5 % (ref 11.5–15.5)
WBC: 13.7 10*3/uL — ABNORMAL HIGH (ref 4.0–10.5)
WBC: 13.3 10*3/uL — ABNORMAL HIGH (ref 4.0–10.5)
nRBC: 0 % (ref 0.0–0.2)
nRBC: 0 % (ref 0.0–0.2)

## 2020-10-14 MED ORDER — POLYETHYLENE GLYCOL 3350 17 G PO PACK
17.0000 g | PACK | Freq: Two times a day (BID) | ORAL | Status: DC
  Administered 2020-10-14 – 2020-10-21 (×6): 17 g via ORAL
  Filled 2020-10-14 (×12): qty 1

## 2020-10-14 MED ORDER — BISACODYL 10 MG RE SUPP
10.0000 mg | Freq: Once | RECTAL | Status: AC
  Administered 2020-10-14: 10 mg via RECTAL
  Filled 2020-10-14: qty 1

## 2020-10-14 MED ORDER — HEPARIN SODIUM (PORCINE) 1000 UNIT/ML IJ SOLN
INTRAMUSCULAR | Status: AC
  Administered 2020-10-14: 1000 [IU]
  Filled 2020-10-14: qty 4

## 2020-10-14 NOTE — Progress Notes (Signed)
PROGRESS NOTE    Brett Butler  KYH:[id number] DOB: [birth year] DOA: 09/28/2020 PCP: Lavinia Sharps, NP   Brief Narrative: Brett Butler is a 55 y.o. male with a history of polysubstance use including opiates, cocaine, cannabis in addition to schizoaffective disorder.  Patient presented secondary to being found to be lethargic by his friend.  Patient responded to Narcan when EMS arrived.  On arrival to the hospital, patient was noted to have a severe AKI and evidence of severe rhabdomyolysis requiring HD.    Assessment & Plan:   Principal Problem:   ARF (acute renal failure) (HCC) Active Problems:   Rhabdomyolysis   Cocaine abuse (HCC)   Hyperkalemia   Leukocytosis   Injury of left sciatic nerve   AKI Likely pigment-induced injury from rhabdomyolysis.  Baseline creatinine of about 1.  Creatinine of 4.95 on admission with continued increase.  Patient started on IV fluids at 150 ml/hr with increase to 200 ml/hr by nephrology with no improvement in creatinine or urine output. Urinalysis with significant hemoglobin/no RBCs. Temporary HD catheter placed on 8/18 and HD initiated the same day. Continues to require hemodialysis. UOP continues to increase; 1740 mL over the last 24 hours including 400 mL that was left over from the night prior. -Nephrology recommendations: Continued HD (keeping even), calcium acetate for hyperphosphatemia -Strict ins/out  Rhabdomyolysis Secondary to to prolonged time down and resultant trauma.  Advanced imaging with evidence consistent with rhabdomyolysis.  With significant amount of edema noted on advanced imaging, orthopedic surgery was consulted with recommendations for no surgical management. CK trending down now. AST/ALT trending down. Resolved.  Acute anemia Unknown etiology. No evidence of bleed. Possibly related to prior diagnosed left thigh hematoma with possibly persistent bleed. Also in setting of AKI. Hemoglobin drop of 3.3 over 6 days down to  6.4 on 8/30. 2 units of PRBC transfused on 8/30. Hemoglobin up to 8.5 and stable. No hematoma on ultrasound. -Daily CBC to ensure stability  Left leg weakness Sciatic neuropathy Neuropathic pain In setting of prolonged time down and likely sciatic nerve damage. Concern there may not be full recovery but he has had some improvement. PT/OT recommending CIR. Was given gabapentin which is now discontinued secondary to episodes of shaking concerning for gabapentin side effect.  Atrial fibrillation/flutter Patient started on diltiazem drip. Currently rate controlled. Transthoracic Echocardiogram unremarkable. Appears to have been transient. Diltiazem discontinued. -Continue Telemetry  Hyperkalemia Secondary to AKI.  Peak potassium 6.7.  Associated peaked T waves.  Treated with Lokelma. Now resolved.  Hyponatremia Stable.  Hypocalcemia Correct calcium of about 9 today in setting of hypoalbuminemia. PTH elevated with low 1,25/25-vitamin D. Improved with calcium and vitamin D replacement. -Vitamin D 50,000 units weekly  Demand ischemia Elevated troponin in setting of rhabdomyolysis.  Peak troponin of 140 with delta troponin of 120.  No chest pain.  Cocaine abuse Patient states that he snorts cocaine.  He thinks that the cocaine may have been laced with fentanyl. Social work consulted.  Leukocytosis Likely reactive secondary to rhabdomyolysis.  Patient given Vancomycin, Flagyl and cefepime in the ED which have been discontinued. Blood cultures obtained on admission and are no growth to date.  Resolved.  Constipation -Increase to Miralax BID -If no bowel movement, will add a dulcolax suppository   DVT prophylaxis: SCDs Code Status:   Code Status: Full Code Family Communication: None at bedside Disposition Plan: Discharge to home with home health pending nephrology recommendations for AKI   Consultants:  Orthopedic surgery Neurology  Procedures:  None  Antimicrobials: Vancomycin  IV Cefepime IV Flagyl IV    Subjective: No issues overnight. Trying to have a bowel movement.  Objective: Vitals:   10/14/20 0600 10/14/20 0642 10/14/20 0755 10/14/20 1129  BP:   133/79 122/80  Pulse: 90 87 100 77  Resp: 18 10 16 15   Temp:   99.1 F (37.3 C) 99.1 F (37.3 C)  TempSrc:   Oral Oral  SpO2: 91% 91% 90% 93%  Weight:      Height:        Intake/Output Summary (Last 24 hours) at 10/14/2020 1344 Last data filed at 10/14/2020 0755 Gross per 24 hour  Intake 1000 ml  Output 1370 ml  Net -370 ml    Filed Weights   10/08/20 0222 10/12/20 0750 10/12/20 1133  Weight: 72.9 kg 80.6 kg 80.6 kg    Examination:  General exam: Appears calm and comfortable Respiratory system: Clear to auscultation. Respiratory effort normal. Cardiovascular system: S1 & S2 heard, RRR. No murmurs, rubs, gallops or clicks. Gastrointestinal system: Abdomen is mildly distended, soft and nontender. No organomegaly or masses felt. Normal bowel sounds heard. Central nervous system: Alert and oriented. Musculoskeletal: No edema. No calf tenderness Skin: No cyanosis. No rashes Psychiatry: Judgement and insight appear normal. Mood & affect appropriate.    Data Reviewed: I have personally reviewed following labs and imaging studies  CBC Lab Results  Component Value Date   WBC 13.7 (H) 10/14/2020   RBC 2.79 (L) 10/14/2020   HGB 8.4 (L) 10/14/2020   HCT 25.4 (L) 10/14/2020   MCV 91.0 10/14/2020   MCH 30.1 10/14/2020   PLT 374 10/14/2020   MCHC 33.1 10/14/2020   RDW 14.8 10/14/2020   LYMPHSABS 1.9 10/06/2020   MONOABS 1.5 (H) 10/06/2020   EOSABS 0.7 (H) 10/06/2020   BASOSABS 0.1 10/06/2020     Last metabolic panel Lab Results  Component Value Date   NA 134 (L) 10/14/2020   K 4.1 10/14/2020   CL 94 (L) 10/14/2020   CO2 23 10/14/2020   BUN 61 (H) 10/14/2020   CREATININE 9.44 (H) 10/14/2020   GLUCOSE 90 10/14/2020   GFRNONAA 6 (L) 10/14/2020   GFRAA >60 07/30/2019   CALCIUM 8.6  (L) 10/14/2020   PHOS 6.7 (H) 10/14/2020   PROT 5.2 (L) 10/13/2020   ALBUMIN 2.2 (L) 10/14/2020   BILITOT 0.8 10/13/2020   ALKPHOS 38 10/13/2020   AST 57 (H) 10/13/2020   ALT 27 10/13/2020   ANIONGAP 17 (H) 10/14/2020    CBG (last 3)  No results for input(s): GLUCAP in the last 72 hours.   GFR: Estimated Creatinine Clearance: 8.5 mL/min (A) (by C-G formula based on SCr of 9.44 mg/dL (H)).  Coagulation Profile: No results for input(s): INR, PROTIME in the last 168 hours.  No results found for this or any previous visit (from the past 240 hour(s)).       Radiology Studies: ECHOCARDIOGRAM COMPLETE  Result Date: 10/12/2020    ECHOCARDIOGRAM REPORT   Patient Name:   NOUR SCALISE Date of Exam: 10/12/2020 Medical Rec #:  10/14/2020      Height:       68.0 in Accession #:    [id number]     Weight:       177.7 lb Date of Birth:  Dec 21, 1965     BSA:          1.944 m Patient Age:    54 years       BP:  111/61 mmHg Patient Gender: M              HR:           83 bpm. Exam Location:  Inpatient Procedure: 2D Echo, Cardiac Doppler, Color Doppler and 3D Echo Indications:    Atrial Fibrillation I48.91  History:        Patient has no prior history of Echocardiogram examinations.                 Polysubstance abuse. Acute kidney injury, Elevated                 troponin/demand ischemia, Leukocytosis due to rhabdomyolysis.  Sonographer:    Leta Junglingiffany Cooper RDCS Referring Phys: [phone] ABRAHAM FELIZ ORTIZ IMPRESSIONS  1. Left ventricular ejection fraction, by estimation, is 60 to 65%. Left ventricular ejection fraction by 3D volume is 67 %. The left ventricle has normal function. The left ventricle has no regional wall motion abnormalities. Left ventricular diastolic  parameters were normal.  2. Right ventricular systolic function is normal. The right ventricular size is normal.  3. The mitral valve is normal in structure. No evidence of mitral valve regurgitation. No evidence of mitral stenosis.  4.  The aortic valve is normal in structure. Aortic valve regurgitation is not visualized. No aortic stenosis is present.  5. There is borderline dilatation of the aortic root, measuring 39 mm.  6. The inferior vena cava is normal in size with greater than 50% respiratory variability, suggesting right atrial pressure of 3 mmHg. FINDINGS  Left Ventricle: Left ventricular ejection fraction, by estimation, is 60 to 65%. Left ventricular ejection fraction by 3D volume is 67 %. The left ventricle has normal function. The left ventricle has no regional wall motion abnormalities. The left ventricular internal cavity size was normal in size. There is no left ventricular hypertrophy. Left ventricular diastolic parameters were normal. Normal left ventricular filling pressure. Right Ventricle: The right ventricular size is normal. No increase in right ventricular wall thickness. Right ventricular systolic function is normal. Left Atrium: Left atrial size was normal in size. Right Atrium: Right atrial size was normal in size. Pericardium: There is no evidence of pericardial effusion. Mitral Valve: The mitral valve is normal in structure. No evidence of mitral valve regurgitation. No evidence of mitral valve stenosis. Tricuspid Valve: The tricuspid valve is normal in structure. Tricuspid valve regurgitation is not demonstrated. No evidence of tricuspid stenosis. Aortic Valve: The aortic valve is normal in structure. Aortic valve regurgitation is not visualized. No aortic stenosis is present. Pulmonic Valve: The pulmonic valve was normal in structure. Pulmonic valve regurgitation is not visualized. No evidence of pulmonic stenosis. Aorta: The aortic root is normal in size and structure. There is borderline dilatation of the aortic root, measuring 39 mm. Venous: The inferior vena cava is normal in size with greater than 50% respiratory variability, suggesting right atrial pressure of 3 mmHg. IAS/Shunts: No atrial level shunt detected  by color flow Doppler.  LEFT VENTRICLE PLAX 2D LVIDd:         5.10 cm         Diastology LVIDs:         3.50 cm         LV e' medial:    11.30 cm/s LV PW:         0.80 cm         LV E/e' medial:  6.0 LV IVS:        0.80 cm  LV e' lateral:   18.50 cm/s LVOT diam:     2.30 cm         LV E/e' lateral: 3.7 LV SV:         72 LV SV Index:   37 LVOT Area:     4.15 cm        3D Volume EF                                LV 3D EF:    Left                                             ventricul                                             ar                                             ejection                                             fraction                                             by 3D                                             volume is                                             67 %.                                 3D Volume EF:                                3D EF:        67 %                                LV EDV:       195 ml                                LV ESV:       63 ml  LV SV:        131 ml RIGHT VENTRICLE TAPSE (M-mode): 1.9 cm LEFT ATRIUM             Index       RIGHT ATRIUM           Index LA diam:        3.30 cm 1.70 cm/m  RA Area:     10.90 cm LA Vol (A2C):   29.1 ml 14.97 ml/m RA Volume:   19.10 ml  9.83 ml/m LA Vol (A4C):   35.4 ml 18.21 ml/m LA Biplane Vol: 32.3 ml 16.62 ml/m  AORTIC VALVE LVOT Vmax:   94.60 cm/s LVOT Vmean:  61.600 cm/s LVOT VTI:    0.174 m  AORTA Ao Root diam: 3.90 cm MITRAL VALVE MV Area (PHT): 3.89 cm    SHUNTS MV Decel Time: 195 msec    Systemic VTI:  0.17 m MV E velocity: 67.90 cm/s  Systemic Diam: 2.30 cm MV A velocity: 52.10 cm/s MV E/A ratio:  1.30 Mihai Croitoru MD Electronically signed by Thurmon Fair MD Signature Date/Time: 10/12/2020/5:28:38 PM    Final    [place] LT LOWER EXTREM LTD SOFT TISSUE NON VASCULAR  Result Date: 10/13/2020 CLINICAL DATA:  Swelling left thigh. EXAM: ULTRASOUND left LOWER EXTREMITY LIMITED TECHNIQUE:  Ultrasound examination of the lower extremity soft tissues was performed in the area of clinical concern. COMPARISON:  None. FINDINGS: No mass or cystic lesion within the evaluated left medial thigh. IMPRESSION: No mass or cystic lesion within the evaluated left medial thigh. Electronically Signed   By: Tish Frederickson M.D.   On: 10/13/2020 20:11        Scheduled Meds:  sodium chloride   Intravenous Once   calcium acetate  1,334 mg Oral TID WC   Chlorhexidine Gluconate Cloth  6 each Topical Q0600   docusate sodium  100 mg Oral BID   heparin  5,000 Units Subcutaneous Q8H   metoprolol tartrate  25 mg Oral BID   polyethylene glycol  17 g Oral BID   Vitamin D (Ergocalciferol)  50,000 Units Oral Q7 days   Continuous Infusions:  sodium chloride     sodium chloride        LOS: 16 days     Jacquelin Hawking, MD [facility] 10/14/2020, 1:44 PM  If 7PM-7AM, please contact night-coverage [email or web address]

## 2020-10-14 NOTE — Progress Notes (Signed)
Patient ID: Brett Butler, male   DOB: October 17, 1965, 55 y.o.   MRN: [id number] S: Starting to make more urine, feels a little better. O:BP 122/80 (BP Location: Right Arm)   Pulse 77   Temp 99.1 F (37.3 C) (Oral)   Resp 15   Ht  (1.727 m)   Wt 80.6 kg   SpO2 93%   BMI 27.02 kg/m   Intake/Output Summary (Last 24 hours) at 10/14/2020 1157 Last data filed at 10/14/2020 0755 Gross per 24 hour  Intake 1000 ml  Output 1690 ml  Net -690 ml   Intake/Output: I/O last 3 completed shifts: In: 1733.5 [P.O.:1000; I.V.:95.5; Blood:638] Out: 1740 [Urine:1740]  Intake/Output this shift:  Total I/O In: 400 [P.O.:400] Out: 350 [Urine:350] Weight change:  Gen:NAD CVS: RRR Resp:CTA Abd: +BS, soft, Nt/ND ZDG:UYQIH edema of LLE  Recent Labs  Lab 10/08/20 0412 10/09/20 0658 10/10/20 0345 10/11/20 0423 10/12/20 0227 10/12/20 1353 10/13/20 0619 10/14/20 0431  NA 132* 129* 134* 131* 130* 134* 132* 134*  K 4.9 5.4* 4.9 4.6 4.7 3.3* 3.8 4.1  CL 94* 93* 98 94* 93* 97* 96* 94*  CO2 GLUCOSE 98 102* 112* 90 96 120* 95 90  BUN 56* 80* 44* 64* 87* 34* 50* 61*  CREATININE 7.71* 10.58* 7.11* 9.73* 11.26* 5.97* 7.66* 9.44*  ALBUMIN 2.2*  2.1* 2.2* 2.3*  2.3* 2.2*  2.2* 2.4* 2.2* 2.2*  2.2* 2.2*  CALCIUM 8.1* 7.9* 8.2* 8.4* 8.4* 8.3* 8.1* 8.6*  PHOS 6.5* 7.7* 6.1* 7.7* 8.3* 4.4 6.1* 6.7*  AST 150*  --  115* 82*  --   --  57*  --   ALT 118*  --  53* 37  --   --  27  --    Liver Function Tests: Recent Labs  Lab 10/10/20 0345 10/11/20 0423 10/12/20 0227 10/12/20 1353 10/13/20 0619 10/14/20 0431  AST 115* 82*  --   --  57*  --   ALT 53* 37  --   --  27  --   ALKPHOS 47 40  --   --  38  --   BILITOT 0.7 0.6  --   --  0.8  --   PROT 5.1* 5.0*  --   --  5.2*  --   ALBUMIN 2.3*  2.3* 2.2*  2.2*   < > 2.2* 2.2*  2.2* 2.2*   < > = values in this interval not displayed.   No results for input(s): LIPASE, AMYLASE in the last 168 hours. No results for input(s):  AMMONIA in the last 168 hours. CBC: Recent Labs  Lab 10/12/20 0227 10/12/20 1353 10/13/20 1037 10/14/20 0431  WBC 13.9* 13.2* 13.2* 13.7*  HGB 7.1* 6.4* 8.5* 8.4*  HCT 20.8* 18.7* 24.3* 25.4*  MCV 94.1 92.1 87.7 91.0  PLT 329 340 337 374   Cardiac Enzymes: Recent Labs  Lab 10/10/20 0345 10/11/20 0423 10/12/20 0227 10/13/20 0619 10/14/20 0431  CKTOTAL 2,809* 1,697* 1,500* 899* 718*   CBG: No results for input(s): GLUCAP in the last 168 hours.  Iron Studies: No results for input(s): IRON, TIBC, TRANSFERRIN, FERRITIN in the last 72 hours. Studies/Results: ECHOCARDIOGRAM COMPLETE  Result Date: 10/12/2020    ECHOCARDIOGRAM REPORT   Patient Name:   Brett Butler Date of Exam: 10/12/2020 Medical Rec #:  [id number]      Height:       68.0 in Accession #:    [id number]  Weight:       177.7 lb Date of Birth:  01-07-1966     BSA:          1.944 m Patient Age:    54 years       BP:           111/61 mmHg Patient Gender: M              HR:           83 bpm. Exam Location:  Inpatient Procedure: 2D Echo, Cardiac Doppler, Color Doppler and 3D Echo Indications:    Atrial Fibrillation I48.91  History:        Patient has no prior history of Echocardiogram examinations.                 Polysubstance abuse. Acute kidney injury, Elevated                 troponin/demand ischemia, Leukocytosis due to rhabdomyolysis.  Sonographer:    Leta Junglingiffany Cooper RDCS Referring Phys: [phone] ABRAHAM FELIZ ORTIZ IMPRESSIONS  1. Left ventricular ejection fraction, by estimation, is 60 to 65%. Left ventricular ejection fraction by 3D volume is 67 %. The left ventricle has normal function. The left ventricle has no regional wall motion abnormalities. Left ventricular diastolic  parameters were normal.  2. Right ventricular systolic function is normal. The right ventricular size is normal.  3. The mitral valve is normal in structure. No evidence of mitral valve regurgitation. No evidence of mitral stenosis.  4. The aortic valve is  normal in structure. Aortic valve regurgitation is not visualized. No aortic stenosis is present.  5. There is borderline dilatation of the aortic root, measuring 39 mm.  6. The inferior vena cava is normal in size with greater than 50% respiratory variability, suggesting right atrial pressure of 3 mmHg. FINDINGS  Left Ventricle: Left ventricular ejection fraction, by estimation, is 60 to 65%. Left ventricular ejection fraction by 3D volume is 67 %. The left ventricle has normal function. The left ventricle has no regional wall motion abnormalities. The left ventricular internal cavity size was normal in size. There is no left ventricular hypertrophy. Left ventricular diastolic parameters were normal. Normal left ventricular filling pressure. Right Ventricle: The right ventricular size is normal. No increase in right ventricular wall thickness. Right ventricular systolic function is normal. Left Atrium: Left atrial size was normal in size. Right Atrium: Right atrial size was normal in size. Pericardium: There is no evidence of pericardial effusion. Mitral Valve: The mitral valve is normal in structure. No evidence of mitral valve regurgitation. No evidence of mitral valve stenosis. Tricuspid Valve: The tricuspid valve is normal in structure. Tricuspid valve regurgitation is not demonstrated. No evidence of tricuspid stenosis. Aortic Valve: The aortic valve is normal in structure. Aortic valve regurgitation is not visualized. No aortic stenosis is present. Pulmonic Valve: The pulmonic valve was normal in structure. Pulmonic valve regurgitation is not visualized. No evidence of pulmonic stenosis. Aorta: The aortic root is normal in size and structure. There is borderline dilatation of the aortic root, measuring 39 mm. Venous: The inferior vena cava is normal in size with greater than 50% respiratory variability, suggesting right atrial pressure of 3 mmHg. IAS/Shunts: No atrial level shunt detected by color flow  Doppler.  LEFT VENTRICLE PLAX 2D LVIDd:         5.10 cm         Diastology LVIDs:         3.50  cm         LV e' medial:    11.30 cm/s LV PW:         0.80 cm         LV E/e' medial:  6.0 LV IVS:        0.80 cm         LV e' lateral:   18.50 cm/s LVOT diam:     2.30 cm         LV E/e' lateral: 3.7 LV SV:         72 LV SV Index:   37 LVOT Area:     4.15 cm        3D Volume EF                                LV 3D EF:    Left                                             ventricul                                             ar                                             ejection                                             fraction                                             by 3D                                             volume is                                             67 %.                                 3D Volume EF:                                3D EF:        67 %                                LV EDV:       195 ml  LV ESV:       63 ml                                LV SV:        131 ml RIGHT VENTRICLE TAPSE (M-mode): 1.9 cm LEFT ATRIUM             Index       RIGHT ATRIUM           Index LA diam:        3.30 cm 1.70 cm/m  RA Area:     10.90 cm LA Vol (A2C):   29.1 ml 14.97 ml/m RA Volume:   19.10 ml  9.83 ml/m LA Vol (A4C):   35.4 ml 18.21 ml/m LA Biplane Vol: 32.3 ml 16.62 ml/m  AORTIC VALVE LVOT Vmax:   94.60 cm/s LVOT Vmean:  61.600 cm/s LVOT VTI:    0.174 m  AORTA Ao Root diam: 3.90 cm MITRAL VALVE MV Area (PHT): 3.89 cm    SHUNTS MV Decel Time: 195 msec    Systemic VTI:  0.17 m MV E velocity: 67.90 cm/s  Systemic Diam: 2.30 cm MV A velocity: 52.10 cm/s MV E/A ratio:  1.30 Mihai Croitoru MD Electronically signed by Thurmon Fair MD Signature Date/Time: 10/12/2020/5:28:38 PM    Final    [place] LT LOWER EXTREM LTD SOFT TISSUE NON VASCULAR  Result Date: 10/13/2020 CLINICAL DATA:  Swelling left thigh. EXAM: ULTRASOUND left LOWER EXTREMITY LIMITED TECHNIQUE: Ultrasound  examination of the lower extremity soft tissues was performed in the area of clinical concern. COMPARISON:  None. FINDINGS: No mass or cystic lesion within the evaluated left medial thigh. IMPRESSION: No mass or cystic lesion within the evaluated left medial thigh. Electronically Signed   By: Tish Frederickson M.D.   On: 10/13/2020 20:11    sodium chloride   Intravenous Once   calcium acetate  1,334 mg Oral TID WC   Chlorhexidine Gluconate Cloth  6 each Topical Q0600   docusate sodium  100 mg Oral BID   heparin  5,000 Units Subcutaneous Q8H   metoprolol tartrate  25 mg Oral BID   polyethylene glycol  17 g Oral Daily   Vitamin D (Ergocalciferol)  50,000 Units Oral Q7 days    BMET    Component Value Date/Time   NA 134 (L) 10/14/2020 0431   K 4.1 10/14/2020 0431   CL 94 (L) 10/14/2020 0431   CO2 23 10/14/2020 0431   GLUCOSE 90 10/14/2020 0431   BUN 61 (H) 10/14/2020 0431   CREATININE 9.44 (H) 10/14/2020 0431   CALCIUM 8.6 (L) 10/14/2020 0431   GFRNONAA 6 (L) 10/14/2020 0431   GFRAA >60 07/30/2019 2135   CBC    Component Value Date/Time   WBC 13.7 (H) 10/14/2020 0431   RBC 2.79 (L) 10/14/2020 0431   HGB 8.4 (L) 10/14/2020 0431   HCT 25.4 (L) 10/14/2020 0431   PLT 374 10/14/2020 0431   MCV 91.0 10/14/2020 0431   MCH 30.1 10/14/2020 0431   MCHC 33.1 10/14/2020 0431   RDW 14.8 10/14/2020 0431   LYMPHSABS 1.9 10/06/2020 0704   MONOABS 1.5 (H) 10/06/2020 0704   EOSABS 0.7 (H) 10/06/2020 0704   BASOSABS 0.1 10/06/2020 0704    Assessment/Plan:   AKI due to rhabdomyolysis - oliguric, started HD 10/01/20.  Some increase in UOP but has been dialysis dependent so far.  He is on TTS  schedule and is not candidate for outpatient AKI due to lack of insurance.  Will continue with inpatient care for now and follow for return of renal function. UOP increasing to 1740 overnight, hopefully seeing renal recovery Plan for HD again today but keep even given increased UOP. Tachycardia - HR variable  up to 130's, asymptomatic, IRR IRR concerning for new atrial fib.  Will order ECG.  Reduce UF goal and bfr. Rhabdomyolysis - nontraumatic due to prolonged lack of consciousness related to drug use. Polysubstance abuse - counsel  HTN - stable Constipation - start miralax and follow. Hyperphosphatemia - on calcium acetate as binder. Vascular access - RIJ TDC placed on 10/07/20 Disposition - possible CIR  Irena Cords, MD [facility] [phone]

## 2020-10-14 NOTE — Progress Notes (Signed)
Physical Therapy Treatment Patient Details Name: Brett Butler MRN: [id number] DOB: 11-Aug-1965 Today's Date: 10/14/2020    History of Present Illness Pt is a 55 y.o. male who presented 09/28/20 with lethargy s/p fall after cocaine use. pt found to have ARF and L lower extremity myositis with rhabdomyolosis and possible sciatic nerve damage. PMH: polysubstance abuse    PT Comments    Patient initially not agreeable to therapy session. With encouragement, patient agreeable for short ambulation distance. Patient ambulated 33' with RW and supervision. Requires increased time due to very slowed pace. Cues for increasing Wbing through L LE with intermittent follow through due to pain. D/c plan remains appropriate.     Follow Up Recommendations  Supervision for mobility/OOB;Outpatient PT;Home health PT (charity HHPT if able to receive)     Equipment Recommendations  Rolling Jonee Lamore with 5" wheels;Wheelchair (measurements PT);Wheelchair cushion (measurements PT);3in1 (PT)    Recommendations for Other Services       Precautions / Restrictions Precautions Precautions: Fall Precaution Comments: back precautions to manage pain Restrictions Weight Bearing Restrictions: No    Mobility  Bed Mobility Overal bed mobility: Needs Assistance Bed Mobility: Supine to Sit;Sit to Supine     Supine to sit: Supervision Sit to supine: Supervision   General bed mobility comments: supervision for safety.    Transfers Overall transfer level: Needs assistance Equipment used: Rolling Mariaha Ellington (2 wheeled) Transfers: Sit to/from Stand Sit to Stand: Supervision         General transfer comment: treating L LE as NWB upon standing. Cues for Wbing through L LE  Ambulation/Gait Ambulation/Gait assistance: Supervision Gait Distance (Feet): 20 Feet Assistive device: Rolling Kenza Munar (2 wheeled) Gait Pattern/deviations: Step-to pattern;Decreased step length - left;Decreased weight shift to left;Decreased  dorsiflexion - left;Decreased stride length;Trunk flexed Gait velocity: decreased   General Gait Details: very slowed pace. Improved ability to step backwards this session. Patient continues to have pain in L LE with ambulating. No AFO present in room at time of session. Patient able to compensate for lack of DF on L   Stairs             Wheelchair Mobility    Modified Rankin (Stroke Patients Only) Modified Rankin (Stroke Patients Only) Pre-Morbid Rankin Score: No symptoms Modified Rankin: Moderately severe disability     Balance Overall balance assessment: Needs assistance Sitting-balance support: No upper extremity supported;Feet supported Sitting balance-Leahy Scale: Good     Standing balance support: Bilateral upper extremity supported;During functional activity Standing balance-Leahy Scale: Poor Standing balance comment: reliant on UE support and external assist                            Cognition Arousal/Alertness: Awake/alert Behavior During Therapy: Flat affect;Anxious Overall Cognitive Status: No family/caregiver present to determine baseline cognitive functioning                                        Exercises General Exercises - Lower Extremity Hip Flexion/Marching: Both;10 reps;Standing    General Comments        Pertinent Vitals/Pain Pain Assessment: Faces Faces Pain Scale: Hurts even more Pain Location: posterior L thigh Pain Descriptors / Indicators: Discomfort;Grimacing;Guarding Pain Intervention(s): Limited activity within patient's tolerance;Monitored during session;Repositioned    Home Living  Prior Function            PT Goals (current goals can now be found in the care plan section) Acute Rehab PT Goals Patient Stated Goal: to use the bathroom PT Goal Formulation: With patient Time For Goal Achievement: 10/28/20 Potential to Achieve Goals: Good Progress towards PT goals:  Progressing toward goals    Frequency    Min 3X/week      PT Plan Current plan remains appropriate    Co-evaluation              AM-PAC PT "6 Clicks" Mobility   Outcome Measure  Help needed turning from your back to your side while in a flat bed without using bedrails?: A Little Help needed moving from lying on your back to sitting on the side of a flat bed without using bedrails?: A Little Help needed moving to and from a bed to a chair (including a wheelchair)?: A Little Help needed standing up from a chair using your arms (e.g., wheelchair or bedside chair)?: A Little Help needed to walk in hospital room?: A Little Help needed climbing 3-5 steps with a railing? : A Little 6 Click Score: 18    End of Session Equipment Utilized During Treatment: Gait belt Activity Tolerance: Patient tolerated treatment well Patient left: in bed;with call bell/phone within reach;with bed alarm set Nurse Communication: Mobility status PT Visit Diagnosis: Other abnormalities of gait and mobility (R26.89);Unsteadiness on feet (R26.81);Muscle weakness (generalized) (M62.81);History of falling (Z91.81);Difficulty in walking, not elsewhere classified (R26.2);Other symptoms and signs involving the nervous system (R29.898);Pain Pain - Right/Left: Left Pain - part of body: Hip     Time: 1025-8527 PT Time Calculation (min) (ACUTE ONLY): 25 min  Charges:  $Gait Training: 23-37 mins                     Vishwa Dais A. Dan Humphreys PT, DPT Acute Rehabilitation Services Pager [phone] Office [phone]    Viviann Spare 10/14/2020, 2:07 PM

## 2020-10-14 NOTE — Progress Notes (Signed)
Out Patient Arrangements:   Per [facility] admissions they will not be able to place pt due to him being AKI & no insurance.    Andree Elk [facility] [phone]

## 2020-10-15 LAB — CBC
HCT: 25.1 % — ABNORMAL LOW (ref 39.0–52.0)
Hemoglobin: 8.4 g/dL — ABNORMAL LOW (ref 13.0–17.0)
MCH: 30.9 pg (ref 26.0–34.0)
MCHC: 33.5 g/dL (ref 30.0–36.0)
MCV: 92.3 fL (ref 80.0–100.0)
Platelets: 337 10*3/uL (ref 150–400)
RBC: 2.72 MIL/uL — ABNORMAL LOW (ref 4.22–5.81)
RDW: 14.6 % (ref 11.5–15.5)
WBC: 12 10*3/uL — ABNORMAL HIGH (ref 4.0–10.5)
nRBC: 0 % (ref 0.0–0.2)

## 2020-10-15 LAB — RENAL FUNCTION PANEL
Albumin: 2.2 g/dL — ABNORMAL LOW (ref 3.5–5.0)
Anion gap: 9 (ref 5–15)
BUN: 36 mg/dL — ABNORMAL HIGH (ref 6–20)
CO2: 28 mmol/L (ref 22–32)
Calcium: 8.6 mg/dL — ABNORMAL LOW (ref 8.9–10.3)
Chloride: 97 mmol/L — ABNORMAL LOW (ref 98–111)
Creatinine, Ser: 6.4 mg/dL — ABNORMAL HIGH (ref 0.61–1.24)
GFR, Estimated: 10 mL/min — ABNORMAL LOW (ref >60)
Glucose, Bld: 94 mg/dL (ref 70–99)
Phosphorus: 5.1 mg/dL — ABNORMAL HIGH (ref 2.5–4.6)
Potassium: 3.7 mmol/L (ref 3.5–5.1)
Sodium: 134 mmol/L — ABNORMAL LOW (ref 135–145)

## 2020-10-15 NOTE — Progress Notes (Signed)
PROGRESS NOTE    Brett Butler  LKG:[id number] DOB: 03-Mar-1965 DOA: 09/28/2020 PCP: Brett Sharps, NP   Brief Narrative: Brett Butler is a 55 y.o. male with a history of polysubstance use including opiates, cocaine, cannabis in addition to schizoaffective disorder.  Patient presented secondary to being found to be lethargic by his friend.  Patient responded to Narcan when EMS arrived.  On arrival to the hospital, patient was noted to have a severe AKI and evidence of severe rhabdomyolysis requiring HD.    Assessment & Plan:   Principal Problem:   ARF (acute renal failure) (HCC) Active Problems:   Rhabdomyolysis   Cocaine abuse (HCC)   Hyperkalemia   Leukocytosis   Injury of left sciatic nerve   AKI Likely pigment-induced injury from rhabdomyolysis.  Baseline creatinine of about 1.  Creatinine of 4.95 on admission with continued increase.  Patient started on IV fluids at 150 ml/hr with increase to 200 ml/hr by nephrology with no improvement in creatinine or urine output. Urinalysis with significant hemoglobin/no RBCs. Temporary HD catheter placed on 8/18 and HD initiated the same day. Continues to require hemodialysis. UOP continues to increase; 1740 mL over the last 24 hours including 400 mL that was left over from the night prior. -Nephrology recommendations: holding HD today, calcium acetate for hyperphosphatemia -Strict ins/out  Rhabdomyolysis Secondary to to prolonged time down and resultant trauma.  Advanced imaging with evidence consistent with rhabdomyolysis.  With significant amount of edema noted on advanced imaging, orthopedic surgery was consulted with recommendations for no surgical management. CK trending down now. AST/ALT trending down. Resolved.  Acute anemia Unknown etiology. No evidence of bleed. Possibly related to prior diagnosed left thigh hematoma with possibly persistent bleed. Also in setting of AKI. Hemoglobin drop of 3.3 over 6 days down to 6.4 on  8/30. 2 units of PRBC transfused on 8/30. Hemoglobin up to 8.5 and stable. No hematoma on ultrasound. Stabilized.  Left leg weakness Sciatic neuropathy Neuropathic pain In setting of prolonged time down and likely sciatic nerve damage. Concern there may not be full recovery but he has had some improvement. PT/OT recommending CIR. Was given gabapentin which is now discontinued secondary to episodes of shaking concerning for gabapentin side effect.  Atrial fibrillation/flutter Patient started on diltiazem drip. Currently rate controlled. Transthoracic Echocardiogram unremarkable. Appears to have been transient. Diltiazem discontinued. -Continue Telemetry  Hyperkalemia Secondary to AKI.  Peak potassium 6.7.  Associated peaked T waves.  Treated with Lokelma. Now resolved.  Hyponatremia Stable.  Hypocalcemia Correct calcium of about 9 today in setting of hypoalbuminemia. PTH elevated with low 1,25/25-vitamin D. Improved with calcium and vitamin D replacement. -Vitamin D 50,000 units weekly  Demand ischemia Elevated troponin in setting of rhabdomyolysis.  Peak troponin of 140 with delta troponin of 120.  No chest pain.  Cocaine abuse Patient states that he snorts cocaine.  He thinks that the cocaine may have been laced with fentanyl. Social work consulted.  Leukocytosis Likely reactive secondary to rhabdomyolysis.  Patient given Vancomycin, Flagyl and cefepime in the ED which have been discontinued. Blood cultures obtained on admission and are no growth to date.  Resolved.  Constipation Improvement with dulcolax suppository. Appears to be resolved.   DVT prophylaxis: SCDs Code Status:   Code Status: Full Code Family Communication: None at bedside Disposition Plan: Discharge to home with home health pending nephrology recommendations for AKI   Consultants:  Orthopedic surgery Neurology  Procedures:  None  Antimicrobials: Vancomycin IV Cefepime IV Flagyl  IV     Subjective: Bowel movement overnight.  Objective: Vitals:   10/15/20 0136 10/15/20 0300 10/15/20 0804 10/15/20 1231  BP:  131/77 140/81 140/77  Pulse:  76 81 79  Resp:  18 19 18   Temp:  99.2 F (37.3 C) 99.1 F (37.3 C) 98.7 F (37.1 C)  TempSrc:  Oral Oral Oral  SpO2:  91% 94% 96%  Weight: 72.5 kg     Height:        Intake/Output Summary (Last 24 hours) at 10/15/2020 1249 Last data filed at 10/15/2020 0803 Gross per 24 hour  Intake --  Output 1980 ml  Net -1980 ml    Filed Weights   10/14/20 1315 10/14/20 1620 10/15/20 0136  Weight: 76.2 kg 76.2 kg 72.5 kg    Examination:  General exam: Appears calm and comfortable Respiratory system: Clear to auscultation. Respiratory effort normal. Cardiovascular system: S1 & S2 heard, RRR. No murmurs, rubs, gallops or clicks. Gastrointestinal system: Abdomen is slightly distended, soft and nontender. No organomegaly or masses felt. Normal bowel sounds heard. Central nervous system: Alert and oriented. Musculoskeletal: No edema. No calf tenderness Skin: No cyanosis. No rashes Psychiatry: Judgement and insight appear normal. Mood & affect appropriate.    Data Reviewed: I have personally reviewed following labs and imaging studies  CBC Lab Results  Component Value Date   WBC 12.0 (H) 10/15/2020   RBC 2.72 (L) 10/15/2020   HGB 8.4 (L) 10/15/2020   HCT 25.1 (L) 10/15/2020   MCV 92.3 10/15/2020   MCH 30.9 10/15/2020   PLT 337 10/15/2020   MCHC 33.5 10/15/2020   RDW 14.6 10/15/2020   LYMPHSABS 1.9 10/06/2020   MONOABS 1.5 (H) 10/06/2020   EOSABS 0.7 (H) 10/06/2020   BASOSABS 0.1 10/06/2020     Last metabolic panel Lab Results  Component Value Date   NA 134 (L) 10/15/2020   K 3.7 10/15/2020   CL 97 (L) 10/15/2020   CO2 28 10/15/2020   BUN 36 (H) 10/15/2020   CREATININE 6.40 (H) 10/15/2020   GLUCOSE 94 10/15/2020   GFRNONAA 10 (L) 10/15/2020   GFRAA >60 07/30/2019   CALCIUM 8.6 (L) 10/15/2020   PHOS 5.1 (H)  10/15/2020   PROT 5.2 (L) 10/13/2020   ALBUMIN 2.2 (L) 10/15/2020   BILITOT 0.8 10/13/2020   ALKPHOS 38 10/13/2020   AST 57 (H) 10/13/2020   ALT 27 10/13/2020   ANIONGAP 9 10/15/2020   GFR: Estimated Creatinine Clearance: 12.5 mL/min (A) (by C-G formula based on SCr of 6.4 mg/dL (H)).  Radiology Studies: 12/15/2020 LT LOWER EXTREM LTD SOFT TISSUE NON VASCULAR  Result Date: 10/13/2020 CLINICAL DATA:  Swelling left thigh. EXAM: ULTRASOUND left LOWER EXTREMITY LIMITED TECHNIQUE: Ultrasound examination of the lower extremity soft tissues was performed in the area of clinical concern. COMPARISON:  None. FINDINGS: No mass or cystic lesion within the evaluated left medial thigh. IMPRESSION: No mass or cystic lesion within the evaluated left medial thigh. Electronically Signed   By: 10/15/2020 M.D.   On: 10/13/2020 20:11     Scheduled Meds:  sodium chloride   Intravenous Once   calcium acetate  1,334 mg Oral TID WC   Chlorhexidine Gluconate Cloth  6 each Topical Q0600   docusate sodium  100 mg Oral BID   heparin  5,000 Units Subcutaneous Q8H   metoprolol tartrate  25 mg Oral BID   polyethylene glycol  17 g Oral BID   Vitamin D (Ergocalciferol)  50,000 Units Oral Q7  days   Continuous Infusions:    LOS: 17 days    Jacquelin Hawking, MD [facility] 10/15/2020, 12:49 PM  If 7PM-7AM, please contact night-coverage [email or web address]

## 2020-10-15 NOTE — Progress Notes (Signed)
Patient ID: Brett Butler, male   DOB: 05-14-1965, 55 y.o.   MRN: [id number] S: still having adequate urine output, ~1.8L. he does not remember having HD yesterday (getting days mixed up). Per flowsheet, ran net even yesterday. No complaints. O:BP 140/81 (BP Location: Right Arm)   Pulse 81   Temp 99.1 F (37.3 C) (Oral)   Resp 19   Ht 5\' 8"  (1.727 m)   Wt 72.5 kg   SpO2 94%   BMI 24.30 kg/m   Intake/Output Summary (Last 24 hours) at 10/15/2020 1001 Last data filed at 10/15/2020 0803 Gross per 24 hour  Intake --  Output 1980 ml  Net -1980 ml   Intake/Output: I/O last 3 completed shifts: In: 400 [P.O.:400] Out: 2550 [Urine:2550]  Intake/Output this shift:  Total I/O In: -  Out: 500 [Urine:500] Weight change:  Gen:NAD CVS: RRR Resp:CTA Abd: +BS, soft, Nt/ND 12/15/2020 edema of LLE Neuro: awake, alert  Recent Labs  Lab 10/10/20 0345 10/11/20 0423 10/12/20 0227 10/12/20 1353 10/13/20 0619 10/14/20 0431 10/15/20 0232  NA 134* 131* 130* 134* 132* 134* 134*  K 4.9 4.6 4.7 3.3* 3.8 4.1 3.7  CL 98 94* 93* 97* 96* 94* 97*  CO2 24 23 22 28 23 23 28   GLUCOSE 112* 90 96 120* 95 90 94  BUN 44* 64* 87* 34* 50* 61* 36*  CREATININE 7.11* 9.73* 11.26* 5.97* 7.66* 9.44* 6.40*  ALBUMIN 2.3*  2.3* 2.2*  2.2* 2.4* 2.2* 2.2*  2.2* 2.2* 2.2*  CALCIUM 8.2* 8.4* 8.4* 8.3* 8.1* 8.6* 8.6*  PHOS 6.1* 7.7* 8.3* 4.4 6.1* 6.7* 5.1*  AST 115* 82*  --   --  57*  --   --   ALT 53* 37  --   --  27  --   --    Liver Function Tests: Recent Labs  Lab 10/10/20 0345 10/11/20 0423 10/12/20 0227 10/13/20 0619 10/14/20 0431 10/15/20 0232  AST 115* 82*  --  57*  --   --   ALT 53* 37  --  27  --   --   ALKPHOS 47 40  --  38  --   --   BILITOT 0.7 0.6  --  0.8  --   --   PROT 5.1* 5.0*  --  5.2*  --   --   ALBUMIN 2.3*  2.3* 2.2*  2.2*   < > 2.2*  2.2* 2.2* 2.2*   < > = values in this interval not displayed.   No results for input(s): LIPASE, AMYLASE in the last 168 hours. No results for  input(s): AMMONIA in the last 168 hours. CBC: Recent Labs  Lab 10/12/20 1353 10/13/20 1037 10/14/20 0431 10/14/20 1957 10/15/20 0232  WBC 13.2* 13.2* 13.7* 13.3* 12.0*  HGB 6.4* 8.5* 8.4* 8.6* 8.4*  HCT 18.7* 24.3* 25.4* 25.4* 25.1*  MCV 92.1 87.7 91.0 90.1 92.3  PLT 340 337 374 332 337   Cardiac Enzymes: Recent Labs  Lab 10/10/20 0345 10/11/20 0423 10/12/20 0227 10/13/20 0619 10/14/20 0431  CKTOTAL 2,809* 1,697* 1,500* 899* 718*   CBG: No results for input(s): GLUCAP in the last 168 hours.  Iron Studies: No results for input(s): IRON, TIBC, TRANSFERRIN, FERRITIN in the last 72 hours. Studies/Results: 12/14/20 LT LOWER EXTREM LTD SOFT TISSUE NON VASCULAR  Result Date: 10/13/2020 CLINICAL DATA:  Swelling left thigh. EXAM: ULTRASOUND left LOWER EXTREMITY LIMITED TECHNIQUE: Ultrasound examination of the lower extremity soft tissues was performed in the area of clinical concern. COMPARISON:  None. FINDINGS: No mass or cystic lesion within the evaluated left medial thigh. IMPRESSION: No mass or cystic lesion within the evaluated left medial thigh. Electronically Signed   By: Tish Frederickson M.D.   On: 10/13/2020 20:11    sodium chloride   Intravenous Once   calcium acetate  1,334 mg Oral TID WC   Chlorhexidine Gluconate Cloth  6 each Topical Q0600   docusate sodium  100 mg Oral BID   heparin  5,000 Units Subcutaneous Q8H   metoprolol tartrate  25 mg Oral BID   polyethylene glycol  17 g Oral BID   Vitamin D (Ergocalciferol)  50,000 Units Oral Q7 days    BMET    Component Value Date/Time   NA 134 (L) 10/15/2020 0232   K 3.7 10/15/2020 0232   CL 97 (L) 10/15/2020 0232   CO2 28 10/15/2020 0232   GLUCOSE 94 10/15/2020 0232   BUN 36 (H) 10/15/2020 0232   CREATININE 6.40 (H) 10/15/2020 0232   CALCIUM 8.6 (L) 10/15/2020 0232   GFRNONAA 10 (L) 10/15/2020 0232   GFRAA >60 07/30/2019 2135   CBC    Component Value Date/Time   WBC 12.0 (H) 10/15/2020 0232   RBC 2.72 (L)  10/15/2020 0232   HGB 8.4 (L) 10/15/2020 0232   HCT 25.1 (L) 10/15/2020 0232   PLT 337 10/15/2020 0232   MCV 92.3 10/15/2020 0232   MCH 30.9 10/15/2020 0232   MCHC 33.5 10/15/2020 0232   RDW 14.6 10/15/2020 0232   LYMPHSABS 1.9 10/06/2020 0704   MONOABS 1.5 (H) 10/06/2020 0704   EOSABS 0.7 (H) 10/06/2020 0704   BASOSABS 0.1 10/06/2020 0704    Assessment/Plan:   AKI due to rhabdomyolysis - oliguric, started HD 10/01/20.  Some increase in UOP but has been dialysis dependent so far.  He is on TTS schedule and is not candidate for outpatient AKI due to lack of insurance.  Will continue with inpatient care for now and follow for return of renal function. UOP adequate, renal recovery? Plan is to monitor labs, if kidney functioning worsens tomorrow, then may need to run again on HD (net even) Afib/flutter: was on cardizem gtt now on metoprolol Rhabdomyolysis - nontraumatic due to prolonged lack of consciousness related to drug use. Polysubstance abuse - counsel  HTN - stable Constipation - start miralax and follow. Hyperphosphatemia - on calcium acetate as binder. Vascular access - RIJ TDC placed on 10/07/20 Disposition - possible CIR  Anthony Sar, MD [facility]

## 2020-10-15 NOTE — Progress Notes (Signed)
Occupational Therapy Treatment Patient Details Name: Brett Butler MRN: [id number] DOB: 11-10-1965 Today's Date: 10/15/2020    History of present illness Pt is a 55 y.o. male who presented 09/28/20 with lethargy s/p fall after cocaine use. pt found to have ARF and L lower extremity myositis with rhabdomyolosis and possible sciatic nerve damage. PMH: polysubstance abuse   OT comments  This 55 yo male seen today to let him try his new LLE AFO. Attempted to help pt get his shoe/AFO on; however it would not work with current shoes (too small to accommodate pt's new AFO and his foot). Will continue to follow patient while on acute care.  Follow Up Recommendations  Home health OT;Outpatient OT;Supervision - Intermittent    Equipment Recommendations  3 in 1 bedside commode;Wheelchair (measurements OT);Wheelchair cushion (measurements OT)          Precautions / Restrictions Precautions Precautions: Fall Precaution Comments: back precautions to manage pain Restrictions Weight Bearing Restrictions: No       Mobility Bed Mobility Overal bed mobility: Modified Independent (sometimes needs to A his LLE with his arms to get in and OOB)                                Balance Overall balance assessment: Needs assistance Sitting-balance support: No upper extremity supported;Feet supported Sitting balance-Leahy Scale: Good                                     ADL either performed or assessed with clinical judgement   ADL Overall ADL's : Needs assistance/impaired                       Lower Body Dressing Details (indicate cue type and reason): total A to doff/donn left sock and attempt to donn left shoe with AFO (unable, shoe too small for AFO and pt's foot.)                     Vision Baseline Vision/History: 0 No visual deficits            Cognition Arousal/Alertness: Awake/alert Behavior During Therapy: WFL for tasks  assessed/performed Overall Cognitive Status: No family/caregiver present to determine baseline cognitive functioning                                                            Pertinent Vitals/ Pain       Pain Assessment: 0-10 Pain Score: 6  Pain Location: posterior L thigh Pain Descriptors / Indicators: Discomfort;Grimacing;Guarding Pain Intervention(s): Limited activity within patient's tolerance;Monitored during session;RN gave pain meds during session                   Frequency  Min 2X/week        Progress Toward Goals  OT Goals(current goals can now be found in the care plan section)  Progress towards OT goals: Not progressing toward goals - comment (increased pain and AFO/shoe not allowing pt to put his foot in his present shoe)  Acute Rehab OT Goals Patient Stated Goal: for pain to get better OT Goal Formulation: With patient Time For Goal Achievement:  10/16/20 Potential to Achieve Goals: Good  Plan Discharge plan remains appropriate                     AM-PAC OT "6 Clicks" Daily Activity     Outcome Measure   Help from another person eating meals?: None Help from another person taking care of personal grooming?: A Little Help from another person toileting, which includes using toliet, bedpan, or urinal?: A Little Help from another person bathing (including washing, rinsing, drying)?: A Little Help from another person to put on and taking off regular upper body clothing?: A Little Help from another person to put on and taking off regular lower body clothing?: A Lot 6 Click Score: 18    End of Session    OT Visit Diagnosis: Unsteadiness on feet (R26.81);Muscle weakness (generalized) (M62.81);Pain Pain - Right/Left: Left Pain - part of body: Leg   Activity Tolerance Patient tolerated treatment well   Patient Left in bed;with call bell/phone within reach;with bed alarm set             Time: 1115-5208 OT Time  Calculation (min): 20 min  Charges: OT General Charges $OT Visit: 1 Visit OT Treatments $Self Care/Home Management : 8-22 mins  Brett Butler, OTR/L Acute Rehab Services Pager [phone] Office [phone]     Evette Georges 10/15/2020, 6:45 PM

## 2020-10-16 LAB — RENAL FUNCTION PANEL
Albumin: 2.3 g/dL — ABNORMAL LOW (ref 3.5–5.0)
Anion gap: 11 (ref 5–15)
BUN: 45 mg/dL — ABNORMAL HIGH (ref 6–20)
CO2: 26 mmol/L (ref 22–32)
Calcium: 9.4 mg/dL (ref 8.9–10.3)
Chloride: 101 mmol/L (ref 98–111)
Creatinine, Ser: 7.65 mg/dL — ABNORMAL HIGH (ref 0.61–1.24)
GFR, Estimated: 8 mL/min — ABNORMAL LOW (ref >60)
Glucose, Bld: 96 mg/dL (ref 70–99)
Phosphorus: 6.2 mg/dL — ABNORMAL HIGH (ref 2.5–4.6)
Potassium: 3.7 mmol/L (ref 3.5–5.1)
Sodium: 138 mmol/L (ref 135–145)

## 2020-10-16 MED ORDER — SEVELAMER CARBONATE 800 MG PO TABS
800.0000 mg | ORAL_TABLET | Freq: Three times a day (TID) | ORAL | Status: DC
  Administered 2020-10-16 – 2020-10-22 (×19): 800 mg via ORAL
  Filled 2020-10-16 (×18): qty 1

## 2020-10-16 NOTE — Progress Notes (Signed)
PROGRESS NOTE    Brett Butler  PYK:[id number] DOB: [birth year] DOA: 09/28/2020 PCP: Lavinia Sharps, NP   Brief Narrative: Brett Butler is a 55 y.o. male with a history of polysubstance use including opiates, cocaine, cannabis in addition to schizoaffective disorder.  Patient presented secondary to being found to be lethargic by his friend.  Patient responded to Narcan when EMS arrived.  On arrival to the hospital, patient was noted to have a severe AKI and evidence of severe rhabdomyolysis requiring HD.    Assessment & Plan:   Principal Problem:   ARF (acute renal failure) (HCC) Active Problems:   Rhabdomyolysis   Cocaine abuse (HCC)   Hyperkalemia   Leukocytosis   Injury of left sciatic nerve   AKI Likely pigment-induced injury from rhabdomyolysis.  Baseline creatinine of about 1.  Creatinine of 4.95 on admission with continued increase and initially not responding to IV fluids. Urinalysis with significant hemoglobin/no RBCs. Temporary HD catheter placed on 8/18 and HD initiated the same day. UOP continues to increase; 2750 mL over the last 24 hours -Nephrology recommendations: HD plans pending today, calcium acetate for hyperphosphatemia -Strict ins/out  Rhabdomyolysis Secondary to prolonged time down and resultant trauma.  Advanced imaging with evidence consistent with rhabdomyolysis.  With significant amount of edema noted on advanced imaging, orthopedic surgery was consulted with recommendations for no surgical management. CK trending down now. AST/ALT trending down. Resolved.  Acute anemia Unknown etiology. No evidence of bleed. Possibly related to prior diagnosed left thigh hematoma with possibly persistent bleed. Also in setting of AKI. Hemoglobin drop of 3.3 over 6 days down to 6.4 on 8/30. 2 units of PRBC transfused on 8/30. Hemoglobin up to 8.5 and stable. No hematoma on ultrasound. Stabilized.  Left leg weakness Sciatic neuropathy Neuropathic pain In setting  of prolonged time down and likely sciatic nerve damage. Concern there may not be full recovery but he has had some improvement. PT/OT recommending CIR. Was given gabapentin which is now discontinued secondary to episodes of shaking concerning for gabapentin side effect.  Atrial fibrillation/flutter Patient started on diltiazem drip. Currently rate controlled. Transthoracic Echocardiogram unremarkable. Appears to have been transient. Diltiazem discontinued.  Hyperkalemia Secondary to AKI.  Peak potassium 6.7.  Associated peaked T waves.  Treated with Lokelma. Now resolved.  Hyponatremia Stable.  Hypocalcemia Correct calcium of about 9 today in setting of hypoalbuminemia. PTH elevated with low 1,25/25-vitamin D. Improved with calcium and vitamin D replacement. -Vitamin D 50,000 units weekly  Demand ischemia Elevated troponin in setting of rhabdomyolysis.  Peak troponin of 140 with delta troponin of 120.  No chest pain.  Cocaine abuse Patient states that he snorts cocaine.  He thinks that the cocaine may have been laced with fentanyl. Social work consulted.  Leukocytosis Likely reactive secondary to rhabdomyolysis.  Patient given Vancomycin, Flagyl and cefepime in the ED which have been discontinued. Blood cultures obtained on admission and are no growth to date.  Resolved.  Constipation Improvement with dulcolax suppository. -Continue Miralax   DVT prophylaxis: SCDs Code Status:   Code Status: Full Code Family Communication: None at bedside Disposition Plan: Discharge to home with home health pending nephrology recommendations for AKI   Consultants:  Orthopedic surgery Neurology  Procedures:  None  Antimicrobials: Vancomycin IV Cefepime IV Flagyl IV    Subjective: No issues overnight. Continues to have good urine output  Objective: Vitals:   10/15/20 2127 10/15/20 2340 10/16/20 0336 10/16/20 0500  BP: 139/80 (!) 145/86 132/77   Pulse:  68 75 78   Resp:  20 15    Temp:  98.4 F (36.9 C) 98.4 F (36.9 C)   TempSrc:  Oral Oral   SpO2:  99% 97%   Weight:    72.5 kg  Height:        Intake/Output Summary (Last 24 hours) at 10/16/2020 0833 Last data filed at 10/16/2020 0755 Gross per 24 hour  Intake 417 ml  Output 2550 ml  Net -2133 ml    Filed Weights   10/14/20 1620 10/15/20 0136 10/16/20 0500  Weight: 76.2 kg 72.5 kg 72.5 kg    Examination:  General exam: Appears calm and comfortable Respiratory system: Clear to auscultation. Respiratory effort normal. Cardiovascular system: S1 & S2 heard, RRR. No murmurs, rubs, gallops or clicks. Gastrointestinal system: Abdomen is slightly distended, soft and nontender. No organomegaly or masses felt. Normal bowel sounds heard. Central nervous system: Alert and oriented. No focal neurological deficits. Musculoskeletal: No edema. No calf tenderness Skin: No cyanosis. No rashes Psychiatry: Judgement and insight appear normal. Mood & affect appropriate.    Data Reviewed: I have personally reviewed following labs and imaging studies  CBC Lab Results  Component Value Date   WBC 12.0 (H) 10/15/2020   RBC 2.72 (L) 10/15/2020   HGB 8.4 (L) 10/15/2020   HCT 25.1 (L) 10/15/2020   MCV 92.3 10/15/2020   MCH 30.9 10/15/2020   PLT 337 10/15/2020   MCHC 33.5 10/15/2020   RDW 14.6 10/15/2020   LYMPHSABS 1.9 10/06/2020   MONOABS 1.5 (H) 10/06/2020   EOSABS 0.7 (H) 10/06/2020   BASOSABS 0.1 10/06/2020     Last metabolic panel Lab Results  Component Value Date   NA 138 10/16/2020   K 3.7 10/16/2020   CL 101 10/16/2020   CO2 26 10/16/2020   BUN 45 (H) 10/16/2020   CREATININE 7.65 (H) 10/16/2020   GLUCOSE 96 10/16/2020   GFRNONAA 8 (L) 10/16/2020   GFRAA >60 07/30/2019   CALCIUM 9.4 10/16/2020   PHOS 6.2 (H) 10/16/2020   PROT 5.2 (L) 10/13/2020   ALBUMIN 2.3 (L) 10/16/2020   BILITOT 0.8 10/13/2020   ALKPHOS 38 10/13/2020   AST 57 (H) 10/13/2020   ALT 27 10/13/2020   ANIONGAP 11 10/16/2020    GFR: Estimated Creatinine Clearance: 10.4 mL/min (A) (by C-G formula based on SCr of 7.65 mg/dL (H)).  Radiology Studies: No results found.   Scheduled Meds:  sodium chloride   Intravenous Once   Chlorhexidine Gluconate Cloth  6 each Topical Q0600   docusate sodium  100 mg Oral BID   heparin  5,000 Units Subcutaneous Q8H   metoprolol tartrate  25 mg Oral BID   polyethylene glycol  17 g Oral BID   sevelamer carbonate  800 mg Oral TID WC   Vitamin D (Ergocalciferol)  50,000 Units Oral Q7 days   Continuous Infusions:    LOS: 18 days    Jacquelin Hawking, MD [facility] 10/16/2020, 8:33 AM  If 7PM-7AM, please contact night-coverage [email or web address]

## 2020-10-16 NOTE — Progress Notes (Signed)
Patient ID: Brett Butler, male   DOB: Aug 25, 1965, 55 y.o.   MRN: [id number] S: adequate uop, 2.7L. Cr up to 7.7. No complaints.  O:BP 132/77 (BP Location: Right Arm)   Pulse 78   Temp 98.4 F (36.9 C) (Oral)   Resp 15   Ht 5\' 8"  (1.727 m)   Wt 72.5 kg   SpO2 97%   BMI 24.30 kg/m   Intake/Output Summary (Last 24 hours) at 10/16/2020 1143 Last data filed at 10/16/2020 0755 Gross per 24 hour  Intake 417 ml  Output 2550 ml  Net -2133 ml   Intake/Output: I/O last 3 completed shifts: In: 377 [P.O.:377] Out: 3950 [Urine:3950]  Intake/Output this shift:  Total I/O In: 240 [P.O.:240] Out: 300 [Urine:300] Weight change: -3.7 kg Gen:NAD CVS: RRR Resp:CTA Abd: +BS, soft, Nt/ND Ext: no sig edema Neuro: awake, alert Dialysis access: [facility]  Recent Labs  Lab 10/10/20 0345 10/11/20 0423 10/12/20 0227 10/12/20 1353 10/13/20 0619 10/14/20 0431 10/15/20 0232 10/16/20 0036  NA 134* 131* 130* 134* 132* 134* 134* 138  K 4.9 4.6 4.7 3.3* 3.8 4.1 3.7 3.7  CL 98 94* 93* 97* 96* 94* 97* 101  CO2 24 23 22 28 23 23 28 26   GLUCOSE 112* 90 96 120* 95 90 94 96  BUN 44* 64* 87* 34* 50* 61* 36* 45*  CREATININE 7.11* 9.73* 11.26* 5.97* 7.66* 9.44* 6.40* 7.65*  ALBUMIN 2.3*  2.3* 2.2*  2.2* 2.4* 2.2* 2.2*  2.2* 2.2* 2.2* 2.3*  CALCIUM 8.2* 8.4* 8.4* 8.3* 8.1* 8.6* 8.6* 9.4  PHOS 6.1* 7.7* 8.3* 4.4 6.1* 6.7* 5.1* 6.2*  AST 115* 82*  --   --  57*  --   --   --   ALT 53* 37  --   --  27  --   --   --    Liver Function Tests: Recent Labs  Lab 10/10/20 0345 10/11/20 0423 10/12/20 0227 10/13/20 0619 10/14/20 0431 10/15/20 0232 10/16/20 0036  AST 115* 82*  --  57*  --   --   --   ALT 53* 37  --  27  --   --   --   ALKPHOS 47 40  --  38  --   --   --   BILITOT 0.7 0.6  --  0.8  --   --   --   PROT 5.1* 5.0*  --  5.2*  --   --   --   ALBUMIN 2.3*  2.3* 2.2*  2.2*   < > 2.2*  2.2* 2.2* 2.2* 2.3*   < > = values in this interval not displayed.   No results for input(s): LIPASE,  AMYLASE in the last 168 hours. No results for input(s): AMMONIA in the last 168 hours. CBC: Recent Labs  Lab 10/12/20 1353 10/13/20 1037 10/14/20 0431 10/14/20 1957 10/15/20 0232  WBC 13.2* 13.2* 13.7* 13.3* 12.0*  HGB 6.4* 8.5* 8.4* 8.6* 8.4*  HCT 18.7* 24.3* 25.4* 25.4* 25.1*  MCV 92.1 87.7 91.0 90.1 92.3  PLT 340 337 374 332 337   Cardiac Enzymes: Recent Labs  Lab 10/10/20 0345 10/11/20 0423 10/12/20 0227 10/13/20 0619 10/14/20 0431  CKTOTAL 2,809* 1,697* 1,500* 899* 718*   CBG: No results for input(s): GLUCAP in the last 168 hours.  Iron Studies: No results for input(s): IRON, TIBC, TRANSFERRIN, FERRITIN in the last 72 hours. Studies/Results: No results found.  sodium chloride   Intravenous Once   Chlorhexidine Gluconate Cloth  6 each Topical Q0600   docusate sodium  100 mg Oral BID   heparin  5,000 Units Subcutaneous Q8H   metoprolol tartrate  25 mg Oral BID   polyethylene glycol  17 g Oral BID   sevelamer carbonate  800 mg Oral TID WC   Vitamin D (Ergocalciferol)  50,000 Units Oral Q7 days    BMET    Component Value Date/Time   NA 138 10/16/2020 0036   K 3.7 10/16/2020 0036   CL 101 10/16/2020 0036   CO2 26 10/16/2020 0036   GLUCOSE 96 10/16/2020 0036   BUN 45 (H) 10/16/2020 0036   CREATININE 7.65 (H) 10/16/2020 0036   CALCIUM 9.4 10/16/2020 0036   GFRNONAA 8 (L) 10/16/2020 0036   GFRAA >60 07/30/2019 2135   CBC    Component Value Date/Time   WBC 12.0 (H) 10/15/2020 0232   RBC 2.72 (L) 10/15/2020 0232   HGB 8.4 (L) 10/15/2020 0232   HCT 25.1 (L) 10/15/2020 0232   PLT 337 10/15/2020 0232   MCV 92.3 10/15/2020 0232   MCH 30.9 10/15/2020 0232   MCHC 33.5 10/15/2020 0232   RDW 14.6 10/15/2020 0232   LYMPHSABS 1.9 10/06/2020 0704   MONOABS 1.5 (H) 10/06/2020 0704   EOSABS 0.7 (H) 10/06/2020 0704   BASOSABS 0.1 10/06/2020 0704    Assessment/Plan:   AKI due to rhabdomyolysis - oliguric, started HD 10/01/20.  Some increase in UOP but has been  dialysis dependent so far.  He is on TTS schedule and is not candidate for outpatient AKI due to lack of insurance.  Will continue with inpatient care for now and follow for return of renal function. UOP adequate, renal recovery? However no signs of recovery on labs, will plan for HD today but will run net even. Afib/flutter: was on cardizem gtt now on metoprolol Rhabdomyolysis - nontraumatic due to prolonged lack of consciousness related to drug use. Polysubstance abuse - counsel  HTN - stable Constipation - start miralax and follow. Hyperphosphatemia - on calcium acetate as binder. Vascular access - R[facility] TDC placed on 10/07/20 Disposition - possible CIR  Anthony Sar, MD [facility]

## 2020-10-17 NOTE — Progress Notes (Signed)
PROGRESS NOTE    Brett Butler  KZS:[id number] DOB: 30-Apr-1965 DOA: 09/28/2020 PCP: Lavinia Sharps, NP   Brief Narrative: Brett Butler is a 55 y.o. male with a history of polysubstance use including opiates, cocaine, cannabis in addition to schizoaffective disorder.  Patient presented secondary to being found to be lethargic by his friend.  Patient responded to Narcan when EMS arrived.  On arrival to the hospital, patient was noted to have a severe AKI and evidence of severe rhabdomyolysis requiring HD.    Assessment & Plan:   Principal Problem:   ARF (acute renal failure) (HCC) Active Problems:   Rhabdomyolysis   Cocaine abuse (HCC)   Hyperkalemia   Leukocytosis   Injury of left sciatic nerve   AKI Likely pigment-induced injury from rhabdomyolysis.  Baseline creatinine of about 1.  Creatinine of 4.95 on admission with continued increase and initially not responding to IV fluids. Urinalysis with significant hemoglobin/no RBCs. Temporary HD catheter placed on 8/18 and HD initiated the same day. UOP of 3200 mL over the last 24 hours -Nephrology recommendations: HD on hold until 9/6, calcium acetate for hyperphosphatemia -Strict ins/out  Rhabdomyolysis Secondary to prolonged time down and resultant trauma.  Advanced imaging with evidence consistent with rhabdomyolysis.  With significant amount of edema noted on advanced imaging, orthopedic surgery was consulted with recommendations for no surgical management. CK trending down now. AST/ALT trending down. Resolved.  Acute anemia Unknown etiology. No evidence of bleed. Possibly related to prior diagnosed left thigh hematoma with possibly persistent bleed. Also in setting of AKI. Hemoglobin drop of 3.3 over 6 days down to 6.4 on 8/30. 2 units of PRBC transfused on 8/30. Hemoglobin up to 8.5 and stable. No hematoma on ultrasound. Stabilized.  Left leg weakness Sciatic neuropathy Neuropathic pain In setting of prolonged time down  and likely sciatic nerve damage. Concern there may not be full recovery but he has had some improvement. PT/OT recommending CIR. Was given gabapentin which is now discontinued secondary to episodes of shaking concerning for gabapentin side effect.  Atrial fibrillation/flutter Patient started on diltiazem drip. Currently rate controlled. Transthoracic Echocardiogram unremarkable. Appears to have been transient. Diltiazem discontinued. -Continue metoprolol  Hyperkalemia Secondary to AKI.  Peak potassium 6.7.  Associated peaked T waves.  Treated with Lokelma. Now resolved.  Hyponatremia Stable.  Hypocalcemia Correct calcium of about 9 today in setting of hypoalbuminemia. PTH elevated with low 1,25/25-vitamin D. Improved with calcium and vitamin D replacement. -Vitamin D 50,000 units weekly  Demand ischemia Elevated troponin in setting of rhabdomyolysis.  Peak troponin of 140 with delta troponin of 120.  No chest pain.  Cocaine abuse Patient states that he snorts cocaine.  He thinks that the cocaine may have been laced with fentanyl. Social work consulted.  Leukocytosis Likely reactive secondary to rhabdomyolysis.  Patient given Vancomycin, Flagyl and cefepime in the ED which have been discontinued. Blood cultures obtained on admission and are no growth to date.  Resolved.  Constipation Improvement with dulcolax suppository. -Continue Miralax   DVT prophylaxis: SCDs Code Status:   Code Status: Full Code Family Communication: None at bedside Disposition Plan: Discharge to home with home health pending nephrology recommendations for AKI   Consultants:  Orthopedic surgery Neurology  Procedures:  None  Antimicrobials: Vancomycin IV Cefepime IV Flagyl IV    Subjective: Asking about the Prevlon boot but mentions that he is still having issues with left leg mobility.  Objective: Vitals:   10/16/20 2112 10/17/20 0500 10/17/20 0821 10/17/20 1128  BP: 132/80  132/75 134/83   Pulse: 86  90 72  Resp:   18 18  Temp:   98.2 F (36.8 C) 98.3 F (36.8 C)  TempSrc:   Oral Oral  SpO2:   91% 93%  Weight:  71.1 kg    Height:        Intake/Output Summary (Last 24 hours) at 10/17/2020 1231 Last data filed at 10/17/2020 1228 Gross per 24 hour  Intake 480 ml  Output 3475 ml  Net -2995 ml    Filed Weights   10/16/20 1310 10/16/20 1630 10/17/20 0500  Weight: 71.1 kg 71.1 kg 71.1 kg    Examination:  General exam: Appears calm and comfortable Respiratory system: Clear to auscultation. Respiratory effort normal. Cardiovascular system: S1 & S2 heard, RRR. No murmurs, rubs, gallops or clicks. Gastrointestinal system: Abdomen is nondistended, soft and nontender. No organomegaly or masses felt. Normal bowel sounds heard. Central nervous system: Alert and oriented. Musculoskeletal: No edema. No calf tenderness Skin: No cyanosis. No rashes Psychiatry: Judgement and insight appear normal. Mood & affect appropriate.    Data Reviewed: I have personally reviewed following labs and imaging studies  CBC Lab Results  Component Value Date   WBC 12.0 (H) 10/15/2020   RBC 2.72 (L) 10/15/2020   HGB 8.4 (L) 10/15/2020   HCT 25.1 (L) 10/15/2020   MCV 92.3 10/15/2020   MCH 30.9 10/15/2020   PLT 337 10/15/2020   MCHC 33.5 10/15/2020   RDW 14.6 10/15/2020   LYMPHSABS 1.9 10/06/2020   MONOABS 1.5 (H) 10/06/2020   EOSABS 0.7 (H) 10/06/2020   BASOSABS 0.1 10/06/2020     Last metabolic panel Lab Results  Component Value Date   NA 138 10/16/2020   K 3.7 10/16/2020   CL 101 10/16/2020   CO2 26 10/16/2020   BUN 45 (H) 10/16/2020   CREATININE 7.65 (H) 10/16/2020   GLUCOSE 96 10/16/2020   GFRNONAA 8 (L) 10/16/2020   GFRAA >60 07/30/2019   CALCIUM 9.4 10/16/2020   PHOS 6.2 (H) 10/16/2020   PROT 5.2 (L) 10/13/2020   ALBUMIN 2.3 (L) 10/16/2020   BILITOT 0.8 10/13/2020   ALKPHOS 38 10/13/2020   AST 57 (H) 10/13/2020   ALT 27 10/13/2020   ANIONGAP 11 10/16/2020    GFR: Estimated Creatinine Clearance: 10.4 mL/min (A) (by C-G formula based on SCr of 7.65 mg/dL (H)).  Radiology Studies: No results found.   Scheduled Meds:  sodium chloride   Intravenous Once   Chlorhexidine Gluconate Cloth  6 each Topical Q0600   docusate sodium  100 mg Oral BID   heparin  5,000 Units Subcutaneous Q8H   metoprolol tartrate  25 mg Oral BID   polyethylene glycol  17 g Oral BID   sevelamer carbonate  800 mg Oral TID WC   Vitamin D (Ergocalciferol)  50,000 Units Oral Q7 days   Continuous Infusions:    LOS: 19 days    Jacquelin Hawking, MD [facility] 10/17/2020, 12:31 PM  If 7PM-7AM, please contact night-coverage [email or web address]

## 2020-10-17 NOTE — Progress Notes (Addendum)
Patient ID: Brett Butler, male   DOB: 1966/01/31, 55 y.o.   MRN: [id number] S: adequate uop, 3.2L. no complaints. Did well with HD yesterday, ran net even. O:BP 132/75 (BP Location: Right Arm)   Pulse 90   Temp 98.2 F (36.8 C) (Oral)   Resp 18   Ht 5\' 8"  (1.727 m)   Wt 71.1 kg   SpO2 91%   BMI 23.83 kg/m   Intake/Output Summary (Last 24 hours) at 10/17/2020 0926 Last data filed at 10/17/2020 12/17/2020 Gross per 24 hour  Intake 480 ml  Output 3200 ml  Net -2720 ml   Intake/Output: I/O last 3 completed shifts: In: 480 [P.O.:480] Out: 5000 [Urine:5000]  Intake/Output this shift:  Total I/O In: 240 [P.O.:240] Out: 300 [Urine:300] Weight change: -1.4 kg Gen:NAD CVS: RRR Resp:CTA Abd: +BS, soft, Nt/ND Ext: no sig edema Neuro: awake, alert Dialysis access: [facility]  Recent Labs  Lab 10/11/20 0423 10/12/20 0227 10/12/20 1353 10/13/20 0619 10/14/20 0431 10/15/20 0232 10/16/20 0036  NA 131* 130* 134* 132* 134* 134* 138  K 4.6 4.7 3.3* 3.8 4.1 3.7 3.7  CL 94* 93* 97* 96* 94* 97* 101  CO2 23 22 28 23 23 28 26   GLUCOSE 90 96 120* 95 90 94 96  BUN 64* 87* 34* 50* 61* 36* 45*  CREATININE 9.73* 11.26* 5.97* 7.66* 9.44* 6.40* 7.65*  ALBUMIN 2.2*  2.2* 2.4* 2.2* 2.2*  2.2* 2.2* 2.2* 2.3*  CALCIUM 8.4* 8.4* 8.3* 8.1* 8.6* 8.6* 9.4  PHOS 7.7* 8.3* 4.4 6.1* 6.7* 5.1* 6.2*  AST 82*  --   --  57*  --   --   --   ALT 37  --   --  27  --   --   --    Liver Function Tests: Recent Labs  Lab 10/11/20 0423 10/12/20 0227 10/13/20 0619 10/14/20 0431 10/15/20 0232 10/16/20 0036  AST 82*  --  57*  --   --   --   ALT 37  --  27  --   --   --   ALKPHOS 40  --  38  --   --   --   BILITOT 0.6  --  0.8  --   --   --   PROT 5.0*  --  5.2*  --   --   --   ALBUMIN 2.2*  2.2*   < > 2.2*  2.2* 2.2* 2.2* 2.3*   < > = values in this interval not displayed.   No results for input(s): LIPASE, AMYLASE in the last 168 hours. No results for input(s): AMMONIA in the last 168  hours. CBC: Recent Labs  Lab 10/12/20 1353 10/13/20 1037 10/14/20 0431 10/14/20 1957 10/15/20 0232  WBC 13.2* 13.2* 13.7* 13.3* 12.0*  HGB 6.4* 8.5* 8.4* 8.6* 8.4*  HCT 18.7* 24.3* 25.4* 25.4* 25.1*  MCV 92.1 87.7 91.0 90.1 92.3  PLT 340 337 374 332 337   Cardiac Enzymes: Recent Labs  Lab 10/11/20 0423 10/12/20 0227 10/13/20 0619 10/14/20 0431  CKTOTAL 1,697* 1,500* 899* 718*   CBG: No results for input(s): GLUCAP in the last 168 hours.  Iron Studies: No results for input(s): IRON, TIBC, TRANSFERRIN, FERRITIN in the last 72 hours. Studies/Results: No results found.  sodium chloride   Intravenous Once   Chlorhexidine Gluconate Cloth  6 each Topical Q0600   docusate sodium  100 mg Oral BID   heparin  5,000 Units Subcutaneous Q8H   metoprolol tartrate  25 mg Oral BID   polyethylene glycol  17 g Oral BID   sevelamer carbonate  800 mg Oral TID WC   Vitamin D (Ergocalciferol)  50,000 Units Oral Q7 days    BMET    Component Value Date/Time   NA 138 10/16/2020 0036   K 3.7 10/16/2020 0036   CL 101 10/16/2020 0036   CO2 26 10/16/2020 0036   GLUCOSE 96 10/16/2020 0036   BUN 45 (H) 10/16/2020 0036   CREATININE 7.65 (H) 10/16/2020 0036   CALCIUM 9.4 10/16/2020 0036   GFRNONAA 8 (L) 10/16/2020 0036   GFRAA >60 07/30/2019 2135   CBC    Component Value Date/Time   WBC 12.0 (H) 10/15/2020 0232   RBC 2.72 (L) 10/15/2020 0232   HGB 8.4 (L) 10/15/2020 0232   HCT 25.1 (L) 10/15/2020 0232   PLT 337 10/15/2020 0232   MCV 92.3 10/15/2020 0232   MCH 30.9 10/15/2020 0232   MCHC 33.5 10/15/2020 0232   RDW 14.6 10/15/2020 0232   LYMPHSABS 1.9 10/06/2020 0704   MONOABS 1.5 (H) 10/06/2020 0704   EOSABS 0.7 (H) 10/06/2020 0704   BASOSABS 0.1 10/06/2020 0704    Assessment/Plan:   AKI due to rhabdomyolysis - oliguric, started HD 10/01/20.  Some increase in UOP but has been dialysis dependent so far.  He is on TTS schedule and is not candidate for outpatient AKI due to lack of  insurance.   UOP adequate, renal recovery? Plan is to hold on HD until Tuesday (provided there are no indications in the meantime). Will evaluate labs that day and then will decide on HD Afib/flutter: was on cardizem gtt now on metoprolol Rhabdomyolysis - nontraumatic due to prolonged lack of consciousness related to drug use. Polysubstance abuse - counsel  HTN - stable Constipation - per priamry Hyperphosphatemia - corrected cal was around 10.3, changes phoslo to renvela Vascular access - RIJ TDC placed on 10/07/20 Disposition - possible CIR  Anthony Sar, MD [facility]

## 2020-10-18 LAB — RENAL FUNCTION PANEL
Albumin: 2.5 g/dL — ABNORMAL LOW (ref 3.5–5.0)
Anion gap: 11 (ref 5–15)
BUN: 38 mg/dL — ABNORMAL HIGH (ref 6–20)
CO2: 27 mmol/L (ref 22–32)
Calcium: 9.4 mg/dL (ref 8.9–10.3)
Chloride: 102 mmol/L (ref 98–111)
Creatinine, Ser: 5.82 mg/dL — ABNORMAL HIGH (ref 0.61–1.24)
GFR, Estimated: 11 mL/min — ABNORMAL LOW (ref >60)
Glucose, Bld: 93 mg/dL (ref 70–99)
Phosphorus: 5.6 mg/dL — ABNORMAL HIGH (ref 2.5–4.6)
Potassium: 4 mmol/L (ref 3.5–5.1)
Sodium: 140 mmol/L (ref 135–145)

## 2020-10-18 LAB — CBC
HCT: 24.7 % — ABNORMAL LOW (ref 39.0–52.0)
Hemoglobin: 8 g/dL — ABNORMAL LOW (ref 13.0–17.0)
MCH: 30.1 pg (ref 26.0–34.0)
MCHC: 32.4 g/dL (ref 30.0–36.0)
MCV: 92.9 fL (ref 80.0–100.0)
Platelets: 397 10*3/uL (ref 150–400)
RBC: 2.66 MIL/uL — ABNORMAL LOW (ref 4.22–5.81)
RDW: 14.3 % (ref 11.5–15.5)
WBC: 10.4 10*3/uL (ref 4.0–10.5)
nRBC: 0 % (ref 0.0–0.2)

## 2020-10-18 NOTE — Progress Notes (Addendum)
PROGRESS NOTE    Brett Butler  WYO:[id number] DOB: 1965/03/07 DOA: 09/28/2020 PCP: Lavinia Sharps, NP   Brief Narrative:  This 55 years old male with history of polysubstance abuse including opiates, cocaine, cannabis, schizoaffective disorder presented in the ED after he was being found lethargic by his friend.  Patient was on the floor for unknown duration.  Patient responded to Narcan when EMS arrived.  On arrival to the hospital patient was noted to have severe AKI and evidence of severe rhabdomyolysis. AKI didn't improve with IV hydration.  Nephrology consulted and patient started on hemodialysis.  Assessment & Plan:   Principal Problem:   ARF (acute renal failure) (HCC) Active Problems:   Rhabdomyolysis   Cocaine abuse (HCC)   Hyperkalemia   Leukocytosis   Injury of left sciatic nerve  Acute kidney injury: Likely from rhabdomyolysis, baseline creatinine around 1.0 Presented with serum creatinine 4.95 which did not respond to IV fluids. Urinalysis with significant hemoglobin, no RBCs. Nephrology consulted,  temporary HD catheter placed on 8/18 followed by dialysis. Urine output of 3200 mL over last 24 hours Nephrology recommended to hold on hemodialysis , will reassess on 9/6.  Rhabdomyolysis : Secondary to prolonged time lying down. Patient had significant amount of edema noted on imaging, orthopedics was consulted recommended no surgical management. CK is trending down,  AST ALT trending down.  Acute anemia: Unknown etiology, no evidence of bleed. Possibly related to prior left thigh hematoma with possible persistent bleed. Patient has received 2 units of PRBCs. Hemoglobin remained stable.  No hematoma on ultrasound.  Left leg weakness/ Neuropathic pain: This could be in the setting of prolonged time on the floor,  likely sciatic nerve damage. PT and OT recommending CIR,  Patient was given gabapentin which caused some episodes of shaking it was  discontinued.  Atrial fibrillation: Heart rate now controlled, TTE unremarkable.   Continue metoprolol.  Hyperkalemia:  Due to AKI, treated with [facility], now resolved.  Hyponatremia: Resolved.  Hypocalcemia:  Improved with calcium and vitamin D supplementation.  Elevated troponin: In the setting of rhabdomyolysis.  Possibly demand ischemia. Patient denied any chest pain.  Cocaine abuse: Patient reports he snorts cocaine.  He thinks the cocaine may have been laced with fentanyl.  Social worker consulted.  Constipation Continue MiraLAX and Dulcolax suppository:  DVT prophylaxis: SCDs Code Status: full code Family Communication: No family at bedside Disposition Plan:    Status is: Inpatient  Remains inpatient appropriate because:Inpatient level of care appropriate due to severity of illness  Dispo: The patient is from: Home              Anticipated d/c is to: Home              Patient currently is not medically stable to d/c.   Difficult to place patient No  Consultants:  Orthopedic surgery Neurology  Procedures:  TTE.  Antimicrobials:  Anti-infectives (From admission, onward)    Start     Dose/Rate Route Frequency Ordered Stop   10/06/20 0800  ceFAZolin (ANCEF) IVPB 2g/100 mL premix        2 g 200 mL/hr over 30 Minutes Intravenous To Radiology 10/05/20 1623 10/07/20 0911   09/28/20 2130  ceFEPIme (MAXIPIME) 2 g in sodium chloride 0.9 % 100 mL IVPB        2 g 200 mL/hr over 30 Minutes Intravenous  Once 09/28/20 2116 09/28/20 2336   09/28/20 2130  metroNIDAZOLE (FLAGYL) IVPB 500 mg  500 mg 100 mL/hr over 60 Minutes Intravenous  Once 09/28/20 2116 09/28/20 2336   09/28/20 2130  vancomycin (VANCOCIN) IVPB 1000 mg/200 mL premix        1,000 mg 200 mL/hr over 60 Minutes Intravenous  Once 09/28/20 2116 09/29/20 0003       Subjective: Patient was seen and examined at bedside.  Overnight events noted.  Patient reports feeling better. Left thigh swelling  noted, reports pain is improving.  He is not having dialysis today.  Objective: Vitals:   10/18/20 0500 10/18/20 0700 10/18/20 0926 10/18/20 1300  BP:  (!) 140/99 129/78 (!) 143/85  Pulse:   86   Resp:      Temp:  99.1 F (37.3 C)  98.5 F (36.9 C)  TempSrc:  Oral  Oral  SpO2:      Weight: 71.1 kg     Height:        Intake/Output Summary (Last 24 hours) at 10/18/2020 1603 Last data filed at 10/18/2020 1500 Gross per 24 hour  Intake 720 ml  Output 2810 ml  Net -2090 ml   Filed Weights   10/16/20 1630 10/17/20 0500 10/18/20 0500  Weight: 71.1 kg 71.1 kg 71.1 kg    Examination:  General exam: Appears comfortable, not in any acute distress.  Right TDC noted in right chest. Respiratory system: Clear to auscultation bilaterally, respiratory effort normal,  RR 15. Cardiovascular system: S1 & S2 heard, RRR. No JVD, murmurs, rubs, gallops or clicks. No pedal edema. Gastrointestinal system: Abdomen is soft, nontender, nondistended, BS+ Central nervous system: Alert and oriented  x 3. No focal neurological deficits. Extremities: No edema, no cyanosis, no clubbing.  Left thigh swelling noted mildly tender. Skin: No rashes, lesions or ulcers Psychiatry: Judgement and insight appear normal. Mood & affect appropriate.     Data Reviewed: I have personally reviewed following labs and imaging studies  CBC: Recent Labs  Lab 10/13/20 1037 10/14/20 0431 10/14/20 1957 10/15/20 0232 10/18/20 0339  WBC 13.2* 13.7* 13.3* 12.0* 10.4  HGB 8.5* 8.4* 8.6* 8.4* 8.0*  HCT 24.3* 25.4* 25.4* 25.1* 24.7*  MCV 87.7 91.0 90.1 92.3 92.9  PLT 337 374 332 337 397   Basic Metabolic Panel: Recent Labs  Lab 10/13/20 0619 10/14/20 0431 10/15/20 0232 10/16/20 0036 10/18/20 0339  NA 132* 134* 134* 138 140  K 3.8 4.1 3.7 3.7 4.0  CL 96* 94* 97* 101 102  CO2 23 23 28 26 27   GLUCOSE 95 90 94 96 93  BUN 50* 61* 36* 45* 38*  CREATININE 7.66* 9.44* 6.40* 7.65* 5.82*  CALCIUM 8.1* 8.6* 8.6* 9.4 9.4   PHOS 6.1* 6.7* 5.1* 6.2* 5.6*   GFR: Estimated Creatinine Clearance: 13.7 mL/min (A) (by C-G formula based on SCr of 5.82 mg/dL (H)). Liver Function Tests: Recent Labs  Lab 10/13/20 10/15/20 10/14/20 0431 10/15/20 0232 10/16/20 0036 10/18/20 0339  AST 57*  --   --   --   --   ALT 27  --   --   --   --   ALKPHOS 38  --   --   --   --   BILITOT 0.8  --   --   --   --   PROT 5.2*  --   --   --   --   ALBUMIN 2.2*  2.2* 2.2* 2.2* 2.3* 2.5*   No results for input(s): LIPASE, AMYLASE in the last 168 hours. No results for input(s): AMMONIA in the last  168 hours. Coagulation Profile: No results for input(s): INR, PROTIME in the last 168 hours. Cardiac Enzymes: Recent Labs  Lab 10/12/20 0227 10/13/20 0619 10/14/20 0431  CKTOTAL 1,500* 899* 718*   BNP (last 3 results) No results for input(s): PROBNP in the last 8760 hours. HbA1C: No results for input(s): HGBA1C in the last 72 hours. CBG: No results for input(s): GLUCAP in the last 168 hours. Lipid Profile: No results for input(s): CHOL, HDL, LDLCALC, TRIG, CHOLHDL, LDLDIRECT in the last 72 hours. Thyroid Function Tests: No results for input(s): TSH, T4TOTAL, FREET4, T3FREE, THYROIDAB in the last 72 hours. Anemia Panel: No results for input(s): VITAMINB12, FOLATE, FERRITIN, TIBC, IRON, RETICCTPCT in the last 72 hours. Sepsis Labs: No results for input(s): PROCALCITON, LATICACIDVEN in the last 168 hours.  No results found for this or any previous visit (from the past 240 hour(s)).    Radiology Studies: No results found.   Scheduled Meds:  sodium chloride   Intravenous Once   Chlorhexidine Gluconate Cloth  6 each Topical Q0600   docusate sodium  100 mg Oral BID   heparin  5,000 Units Subcutaneous Q8H   metoprolol tartrate  25 mg Oral BID   polyethylene glycol  17 g Oral BID   sevelamer carbonate  800 mg Oral TID WC   Vitamin D (Ergocalciferol)  50,000 Units Oral Q7 days   Continuous Infusions:   LOS: 20 days     Time spent: 35 mins    Fathima Bartl, MD [facility]   If 7PM-7AM, please contact night-coverage

## 2020-10-18 NOTE — Progress Notes (Signed)
OT Cancellation Note  Patient Details Name: Choya Tornow MRN: [id number] DOB: 1966-01-30   Cancelled Treatment:    Reason Eval/Treat Not Completed: Other (comment) Per PT abnormal appearance of calf muscle and hamstring on LLE. PT notified by PT. Will hold until cleared by PA.  Thornell Mule, OT/L   Acute OT Clinical Specialist Acute Rehabilitation Services Pager [phone] Office [phone]  10/18/2020, 9:28 AM

## 2020-10-18 NOTE — Progress Notes (Addendum)
Progress Note   Called by PT for reevaluation due to large knot palpated in left calf and left hamstring when working with PT today with worry for possible left achilles rupture. Patient was evaluated by ortho trauma service and Dr. Lajoyce Corners on 8/17 where they recommended non-surgical management. Unclear how long these have been present. I asked patient how long he felt like left hamstring knot had been there and he states "I don't know, maybe from the beginning".   Exam: LLE - No active ability to dorsiflex or plantarflex at left ankle. AROM of left knee 0-100 deg. Unable to perform straight leg raise. Palpable knot at left hamstrings, TTP. No TTP to left calf, no knot felt. No significant edema, compartments compressible. Negative Thompson's, achilles feels intact in palpation, no TTP to achilles insertion. Unable to differentiate any touch to foot or ankle, with light touch patient screams out that he feels a burning sensation.  Imaging:  CT Left lower extremity 8/16:  1. Marked enlargement of the left adductor and hamstring muscle compartments with surrounding inflammatory changes and intermuscular fluid. This has the appearance of edema/myositis. No definite hematoma. Findings could be due to viral myositis, other post infectious myositis, muscle infarcts or drug related. Could not exclude compartment syndrome given the amount of swelling and edema. However, this is a clinical diagnosis. 2. No significant bony findings. Fracture, septic arthritis or osteomyelitis.  MR left femur 8/16:  1. Severe muscle edema involving the medial and posterior muscle compartments of the left thigh, most consistent with rhabdomyolysis given clinical history.  [place] left lower extremity 8/31: No mass or cystic lesion within the evaluated left medial thigh.  Patient seen and evaluated, found to have left hamstring knot, unknown when it originally presented. No urgent needs at this time. Dr. Dion Saucier has reached  out to Dr. Lajoyce Corners who plans to see patient tomorrow.    Armida Sans 10/18/2020, 1:20 PM

## 2020-10-18 NOTE — Progress Notes (Signed)
[facility] NEPHROLOGY PROGRESS NOTE  Assessment/ Plan:  # Acute kidney injury: This is secondary to pigment nephropathy/rhabdomyolysis.  Started HD on 8/19.  Right IJ TDC placed on 8/25.  He has been getting dialysis per TTS schedule and the last treatment was on Saturday.  Noted he has increased urine output and euvolemic on exam.  I will monitor urine output and lab in the morning to decide if he needs dialysis tomorrow. Recommend strict ins and out, daily lab and avoid nephrotoxins.  # Hyperkalemia: Secondary to rhabdomyolysis and associated acute kidney injury.  Improved now.  #  Rhabdomyolysis, nontraumatic: Secondary to prolonged dependent posturing of the left lower extremity following drug use.    #A. fib/flutter, on metoprolol.  Monitor heart rate.  #Hypertension: Monitor blood pressure.  # Polysubstance abuse: Educated about the need for cessation/abstinence.  Subjective: Seen and examined at bedside.  Urine output 3.5 L in 24 hours.  He reports doing well without any complaint.  Denies nausea, vomiting, chest pain, shortness of breath. Objective Vital signs in last 24 hours: Vitals:   10/18/20 0315 10/18/20 0500 10/18/20 0700 10/18/20 0926  BP: (!) 153/85  (!) 140/99 129/78  Pulse: 81   86  Resp: 20     Temp: 98.6 F (37 C)  99.1 F (37.3 C)   TempSrc: Oral  Oral   SpO2: 93%     Weight:  71.1 kg    Height:       Weight change: 0 kg  Intake/Output Summary (Last 24 hours) at 10/18/2020 1059 Last data filed at 10/18/2020 0940 Gross per 24 hour  Intake 720 ml  Output 3135 ml  Net -2415 ml        Labs: Basic Metabolic Panel: Recent Labs  Lab 10/15/20 0232 10/16/20 0036 10/18/20 0339  NA 134* 138 140  K 3.7 3.7 4.0  CL 97* 101 102  CO2 28 26 27   GLUCOSE 94 96 93  BUN 36* 45* 38*  CREATININE 6.40* 7.65* 5.82*  CALCIUM 8.6* 9.4 9.4  PHOS 5.1* 6.2* 5.6*    Liver Function Tests: Recent Labs  Lab 10/13/20 0619 10/14/20 0431  10/15/20 0232 10/16/20 0036 10/18/20 0339  AST 57*  --   --   --   --   ALT 27  --   --   --   --   ALKPHOS 38  --   --   --   --   BILITOT 0.8  --   --   --   --   PROT 5.2*  --   --   --   --   ALBUMIN 2.2*  2.2*   < > 2.2* 2.3* 2.5*   < > = values in this interval not displayed.    No results for input(s): LIPASE, AMYLASE in the last 168 hours. No results for input(s): AMMONIA in the last 168 hours. CBC: Recent Labs  Lab 10/13/20 1037 10/14/20 0431 10/14/20 1957 10/15/20 0232 10/18/20 0339  WBC 13.2* 13.7* 13.3* 12.0* 10.4  HGB 8.5* 8.4* 8.6* 8.4* 8.0*  HCT 24.3* 25.4* 25.4* 25.1* 24.7*  MCV 87.7 91.0 90.1 92.3 92.9  PLT 337 374 332 337 397    Cardiac Enzymes: Recent Labs  Lab 10/12/20 0227 10/13/20 0619 10/14/20 0431  CKTOTAL 1,500* 899* 718*    CBG: No results for input(s): GLUCAP in the last 168 hours.  Iron Studies: No results for input(s): IRON, TIBC, TRANSFERRIN, FERRITIN in the last 72 hours. Studies/Results: No  results found.  Medications: Infusions:    Scheduled Medications:  sodium chloride   Intravenous Once   Chlorhexidine Gluconate Cloth  6 each Topical Q0600   docusate sodium  100 mg Oral BID   heparin  5,000 Units Subcutaneous Q8H   metoprolol tartrate  25 mg Oral BID   polyethylene glycol  17 g Oral BID   sevelamer carbonate  800 mg Oral TID WC   Vitamin D (Ergocalciferol)  50,000 Units Oral Q7 days    have reviewed scheduled and prn medications.  Physical Exam: General:NAD, comfortable, able to lie flat Heart:RRR, s1s2 nl Lungs: Clear b/l, no crackle Abdomen:soft, Non-tender, non-distended Extremities: No lower extremity edema. Dialysis Access: Right IJ TDC in place  Brett Butler [facility] 10/18/2020,10:59 AM  LOS: 20 days

## 2020-10-18 NOTE — Progress Notes (Signed)
Occupational Therapy Treatment Patient Details Name: Brett Butler MRN: [id number] DOB: April 17, 1965 Today's Date: 10/18/2020    History of present illness Pt is a 55 y.o. male who presented 09/28/20 with lethargy s/p fall after cocaine use. pt found to have ARF and L lower extremity myositis with rhabdomyolosis and possible sciatic nerve damage. PMH: polysubstance abuse   OT comments  Attempted mobility and ADL. Pt self-limiting this session and asking for a whirlpool treatment for his feet prior to mobilizing with OT. Attempted to assess new tennis shoe to use with AFO. Pt not agreeable to try this session. Will follow up later date.   Follow Up Recommendations  Home health OT    Equipment Recommendations  3 in 1 bedside commode;Wheelchair (measurements OT);Wheelchair cushion (measurements OT)    Recommendations for Other Services Other (comment)    Precautions / Restrictions Precautions Precautions: Fall       Mobility Bed Mobility Overal bed mobility: Modified Independent                  Transfers                      Balance Overall balance assessment: Needs assistance Sitting-balance support: No upper extremity supported;Feet supported Sitting balance-Leahy Scale: Good Sitting balance - Comments: no leaning noted                                   ADL either performed or assessed with clinical judgement   ADL                                         General ADL Comments: Attempted to engage pt in ADL task however once seated bedside pt declined further work. Attempted to donn AFO however pt refused.     Vision       Perception     Praxis      Cognition Arousal/Alertness: Awake/alert Behavior During Therapy: Agitated Overall Cognitive Status: No family/caregiver present to determine baseline cognitive functioning                                          Exercises Other Exercises Other  Exercises: educated on importance of completing heel cord stretches   Shoulder Instructions       General Comments      Pertinent Vitals/ Pain       Pain Assessment: 0-10 Pain Score: 10-Worst pain ever Pain Location: posterior L thigh/hamstring and L calf Pain Descriptors / Indicators: Discomfort;Grimacing;Guarding Pain Intervention(s): Limited activity within patient's tolerance  Home Living                                          Prior Functioning/Environment              Frequency  Min 2X/week        Progress Toward Goals  OT Goals(current goals can now be found in the care plan section)  Progress towards OT goals:  (not progressing due to limited participation)  Acute Rehab OT Goals Patient Stated Goal: for pain to get better OT Goal Formulation:  With patient Time For Goal Achievement: 10/16/20 Potential to Achieve Goals: Good ADL Goals Pt Will Perform Grooming: with modified independence;standing Pt Will Perform Lower Body Bathing: with modified independence;sit to/from stand;with adaptive equipment Pt Will Perform Lower Body Dressing: with modified independence;with adaptive equipment;sit to/from stand Pt Will Perform Toileting - Clothing Manipulation and hygiene: with modified independence;sit to/from stand;sitting/lateral leans Pt Will Perform Tub/Shower Transfer: with min assist Pt/caregiver will Perform Home Exercise Program: Both right and left upper extremity;Increased strength;With theraband;Independently Additional ADL Goal #1: Pt will independently verbalzie 3 strategies to reduce risk of falls Additional ADL Goal #2: Pt will verbalize/demonstrate 3 energy conservation techniques to incorporate into ADLs/ADL mobility to increase activity tolerance with min assist-mod I overall.  Plan Discharge plan remains appropriate    Co-evaluation                 AM-PAC OT "6 Clicks" Daily Activity     Outcome Measure                     End of Session    OT Visit Diagnosis: Unsteadiness on feet (R26.81);Muscle weakness (generalized) (M62.81);Pain Pain - Right/Left: Left Pain - part of body: Leg   Activity Tolerance Other (comment) (pt limited participation)   Patient Left in bed;with call bell/phone within reach;with bed alarm set   Nurse Communication Mobility status        Time: 0539-7673 OT Time Calculation (min): 15 min  Charges: OT General Charges $OT Visit: 1 Visit OT Treatments $Self Care/Home Management : 8-22 mins  Luisa Dago, OT/L   Acute OT Clinical Specialist Acute Rehabilitation Services Pager [phone] Office [phone]    [facility] 10/18/2020, 3:42 PM

## 2020-10-18 NOTE — Progress Notes (Signed)
Physical Therapy Treatment Patient Details Name: Brett Butler MRN: [id number] DOB: 17-Jul-1965 Today's Date: 10/18/2020    History of Present Illness Pt is a 55 y.o. male who presented 09/28/20 with lethargy s/p fall after cocaine use. pt found to have ARF and L lower extremity myositis with rhabdomyolosis and possible sciatic nerve damage. PMH: polysubstance abuse    PT Comments    Pt seen to fit with L AFO and shoes that were provided by PT/OT as the AFO and foot didn't fit in patients high top sneaker.  Upon attempting to fit L AFO pt found to have very painful L calf which was balled up just below knee and had noted hard/firm knot in middle of L hamstring. Pt unable to straighten L knee due to pain at posterior thigh/calf, and knee. Pt unable to tolerate L AFO and shoe on due to pain and was unable to ambulate today. RN, Dr. Lucianne Muss, and on-call ortho was consulted to address L posterior thigh and L calf deformities and pain.Acute PT to cont to follow.   Follow Up Recommendations  Supervision for mobility/OOB;Outpatient PT;Home health PT     Equipment Recommendations  Rolling walker with 5" wheels;Wheelchair (measurements PT);Wheelchair cushion (measurements PT);3in1 (PT)    Recommendations for Other Services       Precautions / Restrictions Precautions Precautions: Fall Restrictions Weight Bearing Restrictions: Yes Other Position/Activity Restrictions: Pt self WBAT on L LE due to pain    Mobility  Bed Mobility Overal bed mobility: Needs Assistance Bed Mobility: Supine to Sit;Sit to Supine     Supine to sit: Supervision Sit to supine: Supervision   General bed mobility comments: increased time, definite use of bed rail, c/o L LE pain, unable to straighten L knee out, pt with L DF, relied alot on L hip flexion to lift L LE back into bed    Transfers Overall transfer level: Needs assistance Equipment used: Rolling walker (2 wheeled) Transfers: Sit to/from Stand Sit to  Stand: Min guard         General transfer comment: pt with minimal L LE WBing tolerance due to pain, pt unable to straighten L knee due to pain in posterior thigh, knee, and calf, attempted x2 to stand and pt unable to tolerate  Ambulation/Gait             General Gait Details: unable to tolerate   Stairs             Wheelchair Mobility    Modified Rankin (Stroke Patients Only) Modified Rankin (Stroke Patients Only) Pre-Morbid Rankin Score: No symptoms Modified Rankin: Moderately severe disability     Balance Overall balance assessment: Needs assistance Sitting-balance support: No upper extremity supported;Feet supported Sitting balance-Leahy Scale: Good Sitting balance - Comments: no leaning noted   Standing balance support: Bilateral upper extremity supported;During functional activity Standing balance-Leahy Scale: Poor Standing balance comment: reliant on UE support and external assist                            Cognition Arousal/Alertness: Awake/alert Behavior During Therapy: WFL for tasks assessed/performed Overall Cognitive Status: No family/caregiver present to determine baseline cognitive functioning                                 General Comments: pt particular and doesn't listen to advice but then states "I can't do" it but didn't trial PT  recommendations ie. standing up into walker      Exercises      General Comments General comments (skin integrity, edema, etc.): pt with noted firm/knot in middle of L hamstring and L calf balled up just below back of L knee, RN, MD, and Ortho on call, Thermon Leyland, notified. Pt with point tenderness at middle of L hamstring at sight of knot and L calf      Pertinent Vitals/Pain Pain Assessment: 0-10 Pain Score: 8  Pain Location: posterior L thigh/hamstring and L calf Pain Descriptors / Indicators: Discomfort;Grimacing;Guarding Pain Intervention(s): Limited activity within patient's  tolerance    Home Living                      Prior Function            PT Goals (current goals can now be found in the care plan section) Progress towards PT goals: Progressing toward goals    Frequency    Min 3X/week      PT Plan Current plan remains appropriate    Co-evaluation              AM-PAC PT "6 Clicks" Mobility   Outcome Measure  Help needed turning from your back to your side while in a flat bed without using bedrails?: A Little Help needed moving from lying on your back to sitting on the side of a flat bed without using bedrails?: A Little Help needed moving to and from a bed to a chair (including a wheelchair)?: A Little Help needed standing up from a chair using your arms (e.g., wheelchair or bedside chair)?: A Little Help needed to walk in hospital room?: A Little Help needed climbing 3-5 steps with a railing? : A Little 6 Click Score: 18    End of Session Equipment Utilized During Treatment: Gait belt Activity Tolerance: Patient tolerated treatment well Patient left: in bed;with call bell/phone within reach;with bed alarm set Nurse Communication: Mobility status PT Visit Diagnosis: Other abnormalities of gait and mobility (R26.89);Unsteadiness on feet (R26.81);Muscle weakness (generalized) (M62.81);History of falling (Z91.81);Difficulty in walking, not elsewhere classified (R26.2);Other symptoms and signs involving the nervous system (R29.898);Pain Pain - Right/Left: Left Pain - part of body: Leg     Time: 0732-0802 PT Time Calculation (min) (ACUTE ONLY): 30 min  Charges:  $Therapeutic Activity: 23-37 mins                     Lewis Shock, PT, DPT Acute Rehabilitation Services Pager #: [phone] Office #: [phone]    Iona Hansen 10/18/2020, 10:10 AM

## 2020-10-19 LAB — RENAL FUNCTION PANEL
Albumin: 2.4 g/dL — ABNORMAL LOW (ref 3.5–5.0)
Anion gap: 9 (ref 5–15)
BUN: 40 mg/dL — ABNORMAL HIGH (ref 6–20)
CO2: 28 mmol/L (ref 22–32)
Calcium: 9.9 mg/dL (ref 8.9–10.3)
Chloride: 104 mmol/L (ref 98–111)
Creatinine, Ser: 5.89 mg/dL — ABNORMAL HIGH (ref 0.61–1.24)
GFR, Estimated: 11 mL/min — ABNORMAL LOW (ref >60)
Glucose, Bld: 96 mg/dL (ref 70–99)
Phosphorus: 6.7 mg/dL — ABNORMAL HIGH (ref 2.5–4.6)
Potassium: 3.8 mmol/L (ref 3.5–5.1)
Sodium: 141 mmol/L (ref 135–145)

## 2020-10-19 LAB — CK: Total CK: 272 U/L (ref 49–397)

## 2020-10-19 NOTE — Progress Notes (Addendum)
[facility] NEPHROLOGY PROGRESS NOTE  Assessment/ Plan:  # Acute kidney injury: This is secondary to pigment nephropathy/rhabdomyolysis.  Started HD on 8/19.  Right IJ TDC placed on 8/25.  He hwas getting dialysis per TTS schedule and the last treatment was on 9/3.  It seems like he is now having renal recovery with stable creatinine level and good urine output.  He is not uremic and volume status acceptable.  No plan for dialysis today.  Recommend strict ins and out, daily lab and daily assessment for dialysis need.  # Hyperkalemia: Secondary to rhabdomyolysis and associated acute kidney injury.  Improved now.  #  Rhabdomyolysis, nontraumatic: Secondary to prolonged dependent posturing of the left lower extremity following drug use.    #A. fib/flutter, on metoprolol.  Monitor heart rate.  #Hypertension: Monitor blood pressure.  # Polysubstance abuse: Educated about the need for cessation/abstinence.  Discussed with the primary team and nurse.  Subjective: Seen and examined at bedside.  No new event.  Denies nausea, vomiting, chest pain, shortness of breath.  Urine output is around 2.8 L.  Objective Vital signs in last 24 hours: Vitals:   10/18/20 2327 10/19/20 0335 10/19/20 0500 10/19/20 0800  BP: (!) 143/80 (!) 142/80  135/82  Pulse: 71 70    Resp: 20     Temp: 98.1 F (36.7 C) 98.1 F (36.7 C)  98.7 F (37.1 C)  TempSrc: Oral Oral  Oral  SpO2: 94% 92%    Weight:   71.1 kg   Height:       Weight change: 0 kg  Intake/Output Summary (Last 24 hours) at 10/19/2020 1139 Last data filed at 10/19/2020 0932 Gross per 24 hour  Intake 720 ml  Output 3490 ml  Net -2770 ml        Labs: Basic Metabolic Panel: Recent Labs  Lab 10/16/20 0036 10/18/20 0339 10/19/20 0201  NA 138 140 141  K 3.7 4.0 3.8  CL 101 102 104  CO2 26 27 28   GLUCOSE 96 93 96  BUN 45* 38* 40*  CREATININE 7.65* 5.82* 5.89*  CALCIUM 9.4 9.4 9.9  PHOS 6.2* 5.6* 6.7*    Liver  Function Tests: Recent Labs  Lab 10/13/20 0619 10/14/20 0431 10/16/20 0036 10/18/20 0339 10/19/20 0201  AST 57*  --   --   --   --   ALT 27  --   --   --   --   ALKPHOS 38  --   --   --   --   BILITOT 0.8  --   --   --   --   PROT 5.2*  --   --   --   --   ALBUMIN 2.2*  2.2*   < > 2.3* 2.5* 2.4*   < > = values in this interval not displayed.    No results for input(s): LIPASE, AMYLASE in the last 168 hours. No results for input(s): AMMONIA in the last 168 hours. CBC: Recent Labs  Lab 10/13/20 1037 10/14/20 0431 10/14/20 1957 10/15/20 0232 10/18/20 0339  WBC 13.2* 13.7* 13.3* 12.0* 10.4  HGB 8.5* 8.4* 8.6* 8.4* 8.0*  HCT 24.3* 25.4* 25.4* 25.1* 24.7*  MCV 87.7 91.0 90.1 92.3 92.9  PLT 337 374 332 337 397    Cardiac Enzymes: Recent Labs  Lab 10/13/20 0619 10/14/20 0431 10/19/20 0201  CKTOTAL 899* 718* 272    CBG: No results for input(s): GLUCAP in the last 168 hours.  Iron Studies: No  results for input(s): IRON, TIBC, TRANSFERRIN, FERRITIN in the last 72 hours. Studies/Results: No results found.  Medications: Infusions:    Scheduled Medications:  sodium chloride   Intravenous Once   Chlorhexidine Gluconate Cloth  6 each Topical Q0600   docusate sodium  100 mg Oral BID   heparin  5,000 Units Subcutaneous Q8H   metoprolol tartrate  25 mg Oral BID   polyethylene glycol  17 g Oral BID   sevelamer carbonate  800 mg Oral TID WC   Vitamin D (Ergocalciferol)  50,000 Units Oral Q7 days    have reviewed scheduled and prn medications.  Physical Exam: General:NAD, able to lie flat and comfortable Heart:RRR, s1s2 nl Lungs: Clear b/l, no crackle Abdomen:soft, Non-tender, non-distended Extremities: No lower extremity edema. Dialysis Access: Right IJ TDC in place  Janele Lague [facility] 10/19/2020,11:39 AM  LOS: 21 days

## 2020-10-19 NOTE — Progress Notes (Signed)
PROGRESS NOTE    Brett Butler  SWF:[id number] DOB: 03/20/1965 DOA: 09/28/2020 PCP: Lavinia Sharps, NP   Brief Narrative:  This 55 years old male with history of polysubstance abuse including opiates, cocaine, cannabis, schizoaffective disorder presented in the ED after he was being found lethargic by his friend.  Patient was on the floor for unknown duration.  Patient responded to Narcan when EMS arrived.  On arrival to the hospital patient was noted to have severe AKI and evidence of severe rhabdomyolysis. AKI didn't improve with IV hydration.  Nephrology consulted and patient started on hemodialysis.  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 Urinalysis with significant hemoglobin, no RBCs. Nephrology consulted,  temporary HD catheter placed on 8/18 followed by dialysis 8/19. He has been getting dialysis per TTS schedule. Urine output of 2770 mL over last 24 hours. He appears euvolemic on exam. Nephrology recommended to pause on hemodialysis , will reassess on 9/7. It appears like he is showing renal recovery with stable creatinine level and good urine output.  Rhabdomyolysis :  Resolved. Secondary to prolonged time lying down. Patient had significant amount of edema noted on imaging,  Orthopedics was consulted recommended no surgical management. CK trended down 272 ,  AST ALT trending down.  Acute anemia: Unknown etiology, No evidence of bleed. Possibly related to prior left thigh hematoma with possible persistent bleed. Patient has received 2 units of PRBCs. Hemoglobin remained stable.  No hematoma on ultrasound.  Left leg weakness / Neuropathic  pain: This could be in the setting of prolonged time on the floor,  likely sciatic nerve damage. PT and OT recommending CIR,  Patient was given gabapentin which caused some episodes of shaking, it was discontinued. There is no Achilles contracture, Ortho recommended PRAFO (foot drop splint).  Atrial fibrillation: Heart rate now controlled, TTE unremarkable.   Continue metoprolol.  Hyperkalemia:  Due to AKI, treated with [employer], now resolved.  Hyponatremia: Resolved.  Hypocalcemia:  Improved with calcium and vitamin D supplementation.  Elevated troponin: In the setting of rhabdomyolysis.  Possibly demand ischemia. Patient denied any chest pain.  Cocaine abuse: Patient reports he snorts cocaine.  He thinks the cocaine may have been laced with fentanyl.   Social worker consulted.  Constipation Continue MiraLAX and Dulcolax suppository:  DVT prophylaxis: SCDs Code Status: full code Family Communication: No family at bedside Disposition Plan:    Status is: Inpatient  Remains inpatient appropriate because:Inpatient level of care appropriate due to severity of illness  Dispo: The patient is from: Home              Anticipated d/c is to: Home              Patient currently is not medically stable to d/c.   Difficult to place patient No  Consultants:  Orthopedic surgery Neurology  Procedures:  TTE.  Antimicrobials:  Anti-infectives (From admission, onward)    Start     Dose/Rate Route Frequency Ordered Stop   10/06/20 0800  ceFAZolin (ANCEF) IVPB 2g/100 mL premix        2 g 200 mL/hr over 30 Minutes Intravenous To Radiology 10/05/20 1623 10/07/20 0911   09/28/20  2130  ceFEPIme (MAXIPIME) 2 g in sodium chloride 0.9 % 100 mL IVPB        2 g 200 mL/hr over 30 Minutes Intravenous  Once 09/28/20 2116 09/28/20 2336   09/28/20 2130  metroNIDAZOLE (FLAGYL) IVPB 500 mg        500 mg 100 mL/hr over 60 Minutes Intravenous  Once 09/28/20 2116 09/28/20 2336   09/28/20 2130   vancomycin (VANCOCIN) IVPB 1000 mg/200 mL premix        1,000 mg 200 mL/hr over 60 Minutes Intravenous  Once 09/28/20 2116 09/29/20 0003       Subjective: Patient was seen and examined at bedside.  Overnight events noted.   Patient reports feeling better.  He reports pain is improving. Left thigh swelling is improving.  He is not having dialysis today. He is producing good amount of urine.  Objective: Vitals:   10/18/20 2327 10/19/20 0335 10/19/20 0500 10/19/20 0800  BP: (!) 143/80 (!) 142/80  135/82  Pulse: 71 70    Resp: 20     Temp: 98.1 F (36.7 C) 98.1 F (36.7 C)  98.7 F (37.1 C)  TempSrc: Oral Oral  Oral  SpO2: 94% 92%    Weight:   71.1 kg   Height:        Intake/Output Summary (Last 24 hours) at 10/19/2020 1154 Last data filed at 10/19/2020 0932 Gross per 24 hour  Intake 720 ml  Output 3490 ml  Net -2770 ml   Filed Weights   10/17/20 0500 10/18/20 0500 10/19/20 0500  Weight: 71.1 kg 71.1 kg 71.1 kg    Examination:  General exam: Appears comfortable, not in any acute distress.  Right TDC noted in right chest. Respiratory system: Clear to auscultation bilaterally, respiratory effort normal,  RR 15. Cardiovascular system: S1-S2 heard, irregular rate and rhythm, no murmur.   Gastrointestinal system: Abdomen is soft, nontender, nondistended, BS+ Central nervous system: Alert and oriented  x 3. No focal neurological deficits. Extremities: Left thigh swelling noted,  mildly tender,  Left foot :dorsiflexion negative. Skin: No rashes, lesions or ulcers Psychiatry: Judgement and insight appear normal. Mood & affect appropriate.     Data Reviewed: I have personally reviewed following labs and imaging studies  CBC: Recent Labs  Lab 10/13/20 1037 10/14/20 0431 10/14/20 1957 10/15/20 0232 10/18/20 0339  WBC 13.2* 13.7* 13.3* 12.0* 10.4  HGB 8.5* 8.4* 8.6* 8.4* 8.0*  HCT 24.3* 25.4* 25.4* 25.1* 24.7*  MCV 87.7 91.0 90.1 92.3 92.9  PLT 337 374 332 337 397    Basic Metabolic Panel: Recent Labs  Lab 10/14/20 0431 10/15/20 0232 10/16/20 0036 10/18/20 0339 10/19/20 0201  NA 134* 134* 138 140 141  K 4.1 3.7 3.7 4.0 3.8  CL 94* 97* 101 102 104  CO2 23 28 26 27 28   GLUCOSE 90 94 96 93 96  BUN 61* 36* 45* 38* 40*  CREATININE 9.44* 6.40* 7.65* 5.82* 5.89*  CALCIUM 8.6* 8.6* 9.4 9.4 9.9  PHOS 6.7* 5.1* 6.2* 5.6* 6.7*   GFR: Estimated Creatinine Clearance: 13.5 mL/min (A) (by C-G formula based on SCr of 5.89 mg/dL (H)). Liver Function Tests: Recent Labs  Lab 10/13/20 10/15/20 10/14/20 0431 10/15/20 0232 10/16/20 0036 10/18/20 0339 10/19/20 0201  AST 57*  --   --   --   --   --   ALT 27  --   --   --   --   --   ALKPHOS 38  --   --   --   --   --  BILITOT 0.8  --   --   --   --   --   PROT 5.2*  --   --   --   --   --   ALBUMIN 2.2*  2.2* 2.2* 2.2* 2.3* 2.5* 2.4*   No results for input(s): LIPASE, AMYLASE in the last 168 hours. No results for input(s): AMMONIA in the last 168 hours. Coagulation Profile: No results for input(s): INR, PROTIME in the last 168 hours. Cardiac Enzymes: Recent Labs  Lab 10/13/20 0619 10/14/20 0431 10/19/20 0201  CKTOTAL 899* 718* 272   BNP (last 3 results) No results for input(s): PROBNP in the last 8760 hours. HbA1C: No results for input(s): HGBA1C in the last 72 hours. CBG: No results for input(s): GLUCAP in the last 168 hours. Lipid Profile: No results for input(s): CHOL, HDL, LDLCALC, TRIG, CHOLHDL, LDLDIRECT in the last 72 hours. Thyroid Function Tests: No results for input(s): TSH, T4TOTAL, FREET4, T3FREE, THYROIDAB in the last 72 hours. Anemia Panel: No results for input(s): VITAMINB12, FOLATE, FERRITIN, TIBC, IRON, RETICCTPCT in the last 72 hours. Sepsis Labs: No results for input(s): PROCALCITON, LATICACIDVEN in the last 168 hours.  No results found for this or any previous visit (from the past 240 hour(s)).    Radiology Studies: No results found.   Scheduled Meds:  sodium  chloride   Intravenous Once   Chlorhexidine Gluconate Cloth  6 each Topical Q0600   docusate sodium  100 mg Oral BID   heparin  5,000 Units Subcutaneous Q8H   metoprolol tartrate  25 mg Oral BID   polyethylene glycol  17 g Oral BID   sevelamer carbonate  800 mg Oral TID WC   Vitamin D (Ergocalciferol)  50,000 Units Oral Q7 days   Continuous Infusions:   LOS: 21 days    Time spent: 25 mins    Cipriano Bunker, MD [facility]   If 7PM-7AM, please contact night-coverage

## 2020-10-19 NOTE — Plan of Care (Signed)

## 2020-10-19 NOTE — Progress Notes (Signed)
Orthopedic Tech Progress Note Patient Details:  Brett Butler 1965-07-05 [id number]  Patient did not want to wear the PRAFO BOOT at this time. I did notify the RN   Ortho Devices Type of Ortho Device: Prafo boot/shoe Ortho Device/Splint Location: LLE Ortho Device/Splint Interventions: Ordered   Post Interventions Patient Tolerated: Well Instructions Provided: Care of device  [name] 10/19/2020, 11:47 AM

## 2020-10-19 NOTE — Progress Notes (Signed)
Patient ID: Brett Butler, male   DOB: 1965/06/09, 55 y.o.   MRN: [id number] Patient is seen in follow-up status post crush injury with rhabdomyolysis involving the entire left lower extremity.  Patient now has good plantarflexion strength of the toes and ankle but does not have active dorsiflexion strength of the toes or ankle.  The thigh and calf are soft.  No palpable defects, he does have globalized tenderness to palpation along the hamstrings and posterior calf.  He states they both hurt equally.  There is no palpable defect of the Achilles.  No Achilles contracture, however, patient will need a foot drop splint.  I have ordered a PRAFO while he is in the bed.

## 2020-10-20 LAB — RENAL FUNCTION PANEL
Albumin: 2.7 g/dL — ABNORMAL LOW (ref 3.5–5.0)
Anion gap: 12 (ref 5–15)
BUN: 45 mg/dL — ABNORMAL HIGH (ref 6–20)
CO2: 27 mmol/L (ref 22–32)
Calcium: 10.1 mg/dL (ref 8.9–10.3)
Chloride: 101 mmol/L (ref 98–111)
Creatinine, Ser: 5.55 mg/dL — ABNORMAL HIGH (ref 0.61–1.24)
GFR, Estimated: 11 mL/min — ABNORMAL LOW (ref >60)
Glucose, Bld: 90 mg/dL (ref 70–99)
Phosphorus: 6.9 mg/dL — ABNORMAL HIGH (ref 2.5–4.6)
Potassium: 4 mmol/L (ref 3.5–5.1)
Sodium: 140 mmol/L (ref 135–145)

## 2020-10-20 MED ORDER — GABAPENTIN 100 MG PO CAPS
100.0000 mg | ORAL_CAPSULE | Freq: Every day | ORAL | Status: DC
  Administered 2020-10-20: 100 mg via ORAL
  Filled 2020-10-20: qty 1

## 2020-10-20 NOTE — Progress Notes (Signed)
Physical Therapy Treatment Patient Details Name: Brett Butler MRN: [id number] DOB: [birth year] Today's Date: 10/20/2020    History of Present Illness Pt is a 55 y.o. male who presented 09/28/20 with lethargy s/p fall after cocaine use. pt found to have ARF and L lower extremity myositis with rhabdomyolosis and possible sciatic nerve damage. PMH: polysubstance abuse    PT Comments    Patient resistive to ambulation this session, however agreeable to exercises at EOB. Patient performed repeated sit to stands with min guard. Increased time to complete due to L LE pain. Performed LAQ with pain reduction and stretching of hamstrings. Educated patient to continue stretching calf and hamstrings to assist with pain relief, patient verbalized understanding. D/c plan remains appropriate.     Follow Up Recommendations  Supervision for mobility/OOB;Outpatient PT;Home health PT     Equipment Recommendations  Rolling Brett Butler with 5" wheels;Wheelchair (measurements PT);Wheelchair cushion (measurements PT);3in1 (PT)    Recommendations for Other Services       Precautions / Restrictions Precautions Precautions: Fall Precaution Comments: back precautions to manage pain Restrictions Weight Bearing Restrictions: No Other Position/Activity Restrictions: Pt self WBAT on L LE due to pain    Mobility  Bed Mobility Overal bed mobility: Modified Independent                  Transfers Overall transfer level: Needs assistance Equipment used: Rolling Brett Butler (2 wheeled) Transfers: Sit to/from Stand Sit to Stand: Min guard         General transfer comment: minimal weightbearing through L LE due to pain. Unable to extend L knee in standing but able to perform LAQ with pain reduction. Sit to stand x 7  Ambulation/Gait             General Gait Details: Declined ambulation this session   Stairs             Wheelchair Mobility    Modified Rankin (Stroke Patients Only) Modified  Rankin (Stroke Patients Only) Pre-Morbid Rankin Score: No symptoms Modified Rankin: Moderately severe disability     Balance Overall balance assessment: Needs assistance Sitting-balance support: No upper extremity supported;Feet supported Sitting balance-Brett Butler Scale: Good     Standing balance support: Bilateral upper extremity supported;During functional activity Standing balance-Brett Butler Scale: Poor Standing balance comment: reliant on UE support and external assist                            Cognition Arousal/Alertness: Awake/alert Behavior During Therapy: WFL for tasks assessed/performed Overall Cognitive Status: No family/caregiver present to determine baseline cognitive functioning                                        Exercises General Exercises - Lower Extremity Long Arc Quad: Left;10 reps;Seated Mini-Sqauts: Strengthening;Both;5 reps;Standing Other Exercises Other Exercises: Educated on hamstring stretching while in bed    General Comments        Pertinent Vitals/Pain Pain Assessment: Faces Faces Pain Scale: Hurts even more Pain Location: posterior L thigh/hamstring and L calf Pain Descriptors / Indicators: Discomfort;Grimacing;Guarding Pain Intervention(s): Premedicated before session;Monitored during session;Repositioned    Home Living                      Prior Function            PT Goals (current goals can now be  found in the care plan section) Acute Rehab PT Goals Patient Stated Goal: for pain to get better PT Goal Formulation: With patient Time For Goal Achievement: 10/28/20 Potential to Achieve Goals: Good Progress towards PT goals: Progressing toward goals    Frequency    Min 3X/week      PT Plan Current plan remains appropriate    Co-evaluation              AM-PAC PT "6 Clicks" Mobility   Outcome Measure  Help needed turning from your back to your side while in a flat bed without using  bedrails?: A Little Help needed moving from lying on your back to sitting on the side of a flat bed without using bedrails?: A Little Help needed moving to and from a bed to a chair (including a wheelchair)?: A Little Help needed standing up from a chair using your arms (e.g., wheelchair or bedside chair)?: A Little Help needed to walk in hospital room?: A Little Help needed climbing 3-5 steps with a railing? : A Little 6 Click Score: 18    End of Session Equipment Utilized During Treatment: Gait belt Activity Tolerance: Patient tolerated treatment well Patient left: in bed;with call bell/phone within reach;with bed alarm set Nurse Communication: Mobility status PT Visit Diagnosis: Other abnormalities of gait and mobility (R26.89);Unsteadiness on feet (R26.81);Muscle weakness (generalized) (M62.81);History of falling (Z91.81);Difficulty in walking, not elsewhere classified (R26.2);Other symptoms and signs involving the nervous system (R29.898);Pain Pain - Right/Left: Left Pain - part of body: Leg     Time: 1517-6160 PT Time Calculation (min) (ACUTE ONLY): 23 min  Charges:  $Therapeutic Exercise: 23-37 mins                     Brett Butler A. Dan Humphreys PT, DPT Acute Rehabilitation Services Pager [phone] Office [phone]    Viviann Spare 10/20/2020, 5:41 PM

## 2020-10-20 NOTE — Progress Notes (Signed)
PROGRESS NOTE    Brett Butler  WUJ:[id number] DOB: [birth year] DOA: 09/28/2020 PCP: Lavinia Sharps, NP   Brief Narrative:  This 55 years old male with history of polysubstance abuse including opiates, cocaine, cannabis, schizoaffective disorder presented in the ED after he was being found lethargic by his friend.  Patient was on the floor for unknown duration.  Patient responded to Narcan when EMS arrived.  On arrival to the hospital patient was noted to have severe AKI and evidence of severe rhabdomyolysis. AKI didn't improve with IV hydration.  Nephrology consulted and patient started on hemodialysis.  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 Urinalysis with significant hemoglobin, no RBCs. Nephrology consulted,  temporary HD catheter placed on 8/18 followed by dialysis 8/19. He has been getting dialysis per TTS schedule. Urine output of 2770 mL over last 24 hours. He appears euvolemic on exam. Nephrology recommended to pause on hemodialysis , will reassess on 9/7. It appears like he is showing renal recovery with stable creatinine level and good urine output.  Rhabdomyolysis :  Resolved. Secondary to prolonged time lying down. Patient had significant amount of edema noted on imaging,  Orthopedics was consulted recommended no surgical management. CK trended down 272 ,  AST ALT trending down.  Acute anemia: Unknown etiology, No evidence of bleed. Possibly related to prior left thigh hematoma with possible persistent bleed. Patient has received 2 units of PRBCs. Hemoglobin remained stable.  No hematoma on ultrasound.  Left leg weakness / Neuropathic pain: This could be in the setting of prolonged time on the floor,  likely sciatic nerve damage. PT and OT recommending CIR,  Patient was given gabapentin which caused some episodes  of shaking, it was discontinued.  We will retry this medication again. There is no Achilles contracture, Ortho recommended PRAFO (foot drop splint).  Atrial fibrillation: Heart rate now controlled, TTE unremarkable.   Continue metoprolol.  Hyperkalemia:  Due to AKI, treated with [facility], now resolved.  Hyponatremia: Resolved.  Hypocalcemia:  Improved with calcium and vitamin D supplementation.  Elevated troponin: In the setting of rhabdomyolysis.  Possibly demand ischemia. Patient denied any chest pain.  Cocaine abuse: Patient reports he snorts cocaine.  He thinks the cocaine may have been laced with fentanyl.   Social worker consulted.  Constipation Continue MiraLAX and Dulcolax suppository:  DVT prophylaxis: SCDs Code Status: full code Family Communication: No family at bedside Disposition Plan:    Status is: Inpatient  Remains inpatient appropriate because:Inpatient level of care appropriate due to severity of illness  Dispo: The patient is from: Home              Anticipated d/c is to: Home              Patient currently is not medically stable to d/c.   Difficult to place patient No  Consultants:  Orthopedic surgery Neurology  Procedures:  TTE.  Antimicrobials:  Anti-infectives (From admission, onward)    Start     Dose/Rate Route Frequency Ordered Stop   10/06/20 0800  ceFAZolin (ANCEF) IVPB 2g/100 mL premix        2 g 200 mL/hr over 30 Minutes Intravenous To Radiology 10/05/20 1623 10/07/20 0911   09/28/20 2130  ceFEPIme (MAXIPIME) 2 g in sodium chloride 0.9 % 100 mL IVPB        2 g 200 mL/hr over 30 Minutes  Intravenous  Once 09/28/20 2116 09/28/20 2336   09/28/20 2130  metroNIDAZOLE (FLAGYL) IVPB 500 mg        500 mg 100 mL/hr over 60 Minutes Intravenous  Once 09/28/20 2116 09/28/20 2336   09/28/20 2130  vancomycin (VANCOCIN) IVPB 1000 mg/200 mL premix        1,000 mg 200 mL/hr over 60 Minutes Intravenous  Once 09/28/20 2116 09/29/20 0003        Subjective: Reports left ankle neuropathy leg pain.  No nausea no vomiting.  No fever no chills.  Objective: Vitals:   10/20/20 0739 10/20/20 1157 10/20/20 1549 10/20/20 1908  BP: 131/75 (!) 146/92 129/80 127/84  Pulse: 72 77 66 68  Resp: 13 14  20   Temp: 98.7 F (37.1 C) 98.6 F (37 C) 98.1 F (36.7 C) 98.2 F (36.8 C)  TempSrc: Oral Oral Oral Oral  SpO2: 95% 94% 95% 95%  Weight:      Height:        Intake/Output Summary (Last 24 hours) at 10/20/2020 1957 Last data filed at 10/20/2020 1158 Gross per 24 hour  Intake --  Output 3220 ml  Net -3220 ml    Filed Weights   10/18/20 0500 10/19/20 0500 10/20/20 0500  Weight: 71.1 kg 71.1 kg 71.1 kg    Examination:  General: Appear in mild distress, no Rash; Oral Mucosa Clear, moist. no Abnormal Neck Mass Or lumps, Conjunctiva normal  Cardiovascular: S1 and S2 Present, no Murmur, Respiratory: good respiratory effort, Bilateral Air entry present and CTA, no Crackles, no wheezes Abdomen: Bowel Sound present, Soft and no tenderness Extremities: no Pedal edema Neurology: alert and oriented to time, place, and person affect appropriate. no new focal deficit Gait not checked due to patient safety concerns   Data Reviewed: I have personally reviewed following labs and imaging studies  CBC: Recent Labs  Lab 10/14/20 0431 10/14/20 1957 10/15/20 0232 10/18/20 0339  WBC 13.7* 13.3* 12.0* 10.4  HGB 8.4* 8.6* 8.4* 8.0*  HCT 25.4* 25.4* 25.1* 24.7*  MCV 91.0 90.1 92.3 92.9  PLT 374 332 337 397    Basic Metabolic Panel: Recent Labs  Lab 10/15/20 0232 10/16/20 0036 10/18/20 0339 10/19/20 0201 10/20/20 0203  NA 134* 138 140 141 140  K 3.7 3.7 4.0 3.8 4.0  CL 97* 101 102 104 101  CO2 28 26 27 28 27   GLUCOSE 94 96 93 96 90  BUN 36* 45* 38* 40* 45*  CREATININE 6.40* 7.65* 5.82* 5.89* 5.55*  CALCIUM 8.6* 9.4 9.4 9.9 10.1  PHOS 5.1* 6.2* 5.6* 6.7* 6.9*    GFR: Estimated Creatinine Clearance: 14.4 mL/min (A) (by C-G  formula based on SCr of 5.55 mg/dL (H)). Liver Function Tests: Recent Labs  Lab 10/15/20 0232 10/16/20 0036 10/18/20 0339 10/19/20 0201 10/20/20 0203  ALBUMIN 2.2* 2.3* 2.5* 2.4* 2.7*    No results for input(s): LIPASE, AMYLASE in the last 168 hours. No results for input(s): AMMONIA in the last 168 hours. Coagulation Profile: No results for input(s): INR, PROTIME in the last 168 hours. Cardiac Enzymes: Recent Labs  Lab 10/14/20 0431 10/19/20 0201  CKTOTAL 718* 272    BNP (last 3 results) No results for input(s): PROBNP in the last 8760 hours. HbA1C: No results for input(s): HGBA1C in the last 72 hours. CBG: No results for input(s): GLUCAP in the last 168 hours. Lipid Profile: No results for input(s): CHOL, HDL, LDLCALC, TRIG, CHOLHDL, LDLDIRECT in the last 72 hours. Thyroid Function Tests: No results  for input(s): TSH, T4TOTAL, FREET4, T3FREE, THYROIDAB in the last 72 hours. Anemia Panel: No results for input(s): VITAMINB12, FOLATE, FERRITIN, TIBC, IRON, RETICCTPCT in the last 72 hours. Sepsis Labs: No results for input(s): PROCALCITON, LATICACIDVEN in the last 168 hours.  No results found for this or any previous visit (from the past 240 hour(s)).    Radiology Studies: No results found.   Scheduled Meds:  sodium chloride   Intravenous Once   Chlorhexidine Gluconate Cloth  6 each Topical Q0600   docusate sodium  100 mg Oral BID   gabapentin  100 mg Oral QHS   heparin  5,000 Units Subcutaneous Q8H   metoprolol tartrate  25 mg Oral BID   polyethylene glycol  17 g Oral BID   sevelamer carbonate  800 mg Oral TID WC   Vitamin D (Ergocalciferol)  50,000 Units Oral Q7 days   Continuous Infusions:   LOS: 22 days    Time spent: 25 mins    Lynden Oxford, MD [facility]   If 7PM-7AM, please contact night-coverage

## 2020-10-20 NOTE — Progress Notes (Signed)
[facility] NEPHROLOGY PROGRESS NOTE  Assessment/ Plan:  # Acute kidney injury: This is secondary to pigment nephropathy/rhabdomyolysis.  Started HD on 8/19.  Right IJ TDC placed on 8/25.  He hwas getting dialysis per TTS schedule and the last treatment was on 9/3.  He is having renal recovery with increasing urine output and creatinine level trending down.  Euvolemic and he has no uremic symptoms.  No need for dialysis today and we will continue to monitor with daily lab, strict ins and out.  May be able to remove HD catheter after few days of close monitoring.  # Hyperkalemia: Secondary to rhabdomyolysis and associated acute kidney injury.  Improved now.  #  Rhabdomyolysis, nontraumatic: Secondary to prolonged dependent posturing of the left lower extremity following drug use.    #A. fib/flutter, on metoprolol.  Monitor heart rate.  #Hypertension: Monitor blood pressure.  # Polysubstance abuse: Educated about the need for cessation/abstinence.   Subjective: Seen and examined at bedside.  Urine output 4.2 L in 24 hours.  Denies nausea, vomiting, chest pain, shortness of breath.  No new event.  Objective Vital signs in last 24 hours: Vitals:   10/19/20 2335 10/20/20 0348 10/20/20 0500 10/20/20 0739  BP: (!) 143/81 (!) 154/83  131/75  Pulse: 64 67  72  Resp: 20 20  13   Temp: 98.5 F (36.9 C) 98.2 F (36.8 C)  98.7 F (37.1 C)  TempSrc: Oral Oral  Oral  SpO2: 94% 92%  95%  Weight:   71.1 kg   Height:       Weight change: 0 kg  Intake/Output Summary (Last 24 hours) at 10/20/2020 1027 Last data filed at 10/20/2020 12/20/2020 Gross per 24 hour  Intake 1040 ml  Output 4170 ml  Net -3130 ml        Labs: Basic Metabolic Panel: Recent Labs  Lab 10/18/20 0339 10/19/20 0201 10/20/20 0203  NA 140 141 140  K 4.0 3.8 4.0  CL 102 104 101  CO2 27 28 27   GLUCOSE 93 96 90  BUN 38* 40* 45*  CREATININE 5.82* 5.89* 5.55*  CALCIUM 9.4 9.9 10.1  PHOS 5.6* 6.7* 6.9*     Liver Function Tests: Recent Labs  Lab 10/18/20 0339 10/19/20 0201 10/20/20 0203  ALBUMIN 2.5* 2.4* 2.7*    No results for input(s): LIPASE, AMYLASE in the last 168 hours. No results for input(s): AMMONIA in the last 168 hours. CBC: Recent Labs  Lab 10/13/20 1037 10/14/20 0431 10/14/20 1957 10/15/20 0232 10/18/20 0339  WBC 13.2* 13.7* 13.3* 12.0* 10.4  HGB 8.5* 8.4* 8.6* 8.4* 8.0*  HCT 24.3* 25.4* 25.4* 25.1* 24.7*  MCV 87.7 91.0 90.1 92.3 92.9  PLT 337 374 332 337 397    Cardiac Enzymes: Recent Labs  Lab 10/14/20 0431 10/19/20 0201  CKTOTAL 718* 272    CBG: No results for input(s): GLUCAP in the last 168 hours.  Iron Studies: No results for input(s): IRON, TIBC, TRANSFERRIN, FERRITIN in the last 72 hours. Studies/Results: No results found.  Medications: Infusions:    Scheduled Medications:  sodium chloride   Intravenous Once   Chlorhexidine Gluconate Cloth  6 each Topical Q0600   docusate sodium  100 mg Oral BID   heparin  5,000 Units Subcutaneous Q8H   metoprolol tartrate  25 mg Oral BID   polyethylene glycol  17 g Oral BID   sevelamer carbonate  800 mg Oral TID WC   Vitamin D (Ergocalciferol)  50,000 Units Oral Q7 days  have reviewed scheduled and prn medications.  Physical Exam: General: Looks comfortable, able to lie flat Heart:RRR, s1s2 nl Lungs: Clear b/l, no crackle Abdomen:soft, Non-tender, non-distended Extremities: No lower extremity edema. Dialysis Access: Right IJ TDC in place  Indiah Heyden [facility] 10/20/2020,10:27 AM  LOS: 22 days

## 2020-10-21 LAB — RENAL FUNCTION PANEL
Albumin: 2.7 g/dL — ABNORMAL LOW (ref 3.5–5.0)
Anion gap: 12 (ref 5–15)
BUN: 42 mg/dL — ABNORMAL HIGH (ref 6–20)
CO2: 27 mmol/L (ref 22–32)
Calcium: 10.7 mg/dL — ABNORMAL HIGH (ref 8.9–10.3)
Chloride: 101 mmol/L (ref 98–111)
Creatinine, Ser: 4.88 mg/dL — ABNORMAL HIGH (ref 0.61–1.24)
GFR, Estimated: 13 mL/min — ABNORMAL LOW (ref >60)
Glucose, Bld: 88 mg/dL (ref 70–99)
Phosphorus: 6.2 mg/dL — ABNORMAL HIGH (ref 2.5–4.6)
Potassium: 3.6 mmol/L (ref 3.5–5.1)
Sodium: 140 mmol/L (ref 135–145)

## 2020-10-21 MED ORDER — GABAPENTIN 100 MG PO CAPS
200.0000 mg | ORAL_CAPSULE | Freq: Every day | ORAL | Status: DC
  Administered 2020-10-21: 200 mg via ORAL
  Filled 2020-10-21: qty 2

## 2020-10-21 NOTE — Progress Notes (Signed)
[facility] NEPHROLOGY PROGRESS NOTE  Assessment/ Plan:  # Acute kidney injury: This is secondary to pigment nephropathy/rhabdomyolysis.  Started HD on 8/19.  Right IJ TDC placed on 8/25.  He was getting dialysis per TTS schedule and the last treatment was on 9/3.  He is having renal recovery with increasing urine output and creatinine level trending down.  Euvolemic and he has no uremic symptoms.  No need for dialysis today and we will continue to monitor with daily lab, strict ins and out.   If labs continue to improve tomorrow then we will plan to discontinue tunneled catheter and discharge home.  I have discussed this with the patient.  # Hyperkalemia: Secondary to rhabdomyolysis and associated acute kidney injury.  Improved now.  #  Rhabdomyolysis, nontraumatic: Secondary to prolonged dependent posturing of the left lower extremity following drug use.    #A. fib/flutter, on metoprolol.  Monitor heart rate.  #Hypertension: Monitor blood pressure.  # Polysubstance abuse: Educated about the need for cessation/abstinence.  Subjective: Seen and examined at bedside.  Urine output 2.7 L in 24 hours.  He reports doing well without any new complaint.  No new event.  Objective Vital signs in last 24 hours: Vitals:   10/20/20 1908 10/20/20 2323 10/21/20 0407 10/21/20 0751  BP: 127/84 130/79 134/83 (!) 146/80  Pulse: 68 73 67 76  Resp: 20 20  13   Temp: 98.2 F (36.8 C) 98.2 F (36.8 C) 98.4 F (36.9 C) 99.3 F (37.4 C)  TempSrc: Oral Oral Oral Oral  SpO2: 95% 95% 93% 94%  Weight:      Height:       Weight change:   Intake/Output Summary (Last 24 hours) at 10/21/2020 1100 Last data filed at 10/21/2020 0748 Gross per 24 hour  Intake 240 ml  Output 2275 ml  Net -2035 ml        Labs: Basic Metabolic Panel: Recent Labs  Lab 10/19/20 0201 10/20/20 0203 10/21/20 0345  NA 141 140 140  K 3.8 4.0 3.6  CL 104 101 101  CO2 28 27 27   GLUCOSE 96 90 88  BUN 40* 45*  42*  CREATININE 5.89* 5.55* 4.88*  CALCIUM 9.9 10.1 10.7*  PHOS 6.7* 6.9* 6.2*    Liver Function Tests: Recent Labs  Lab 10/19/20 0201 10/20/20 0203 10/21/20 0345  ALBUMIN 2.4* 2.7* 2.7*    No results for input(s): LIPASE, AMYLASE in the last 168 hours. No results for input(s): AMMONIA in the last 168 hours. CBC: Recent Labs  Lab 10/14/20 1957 10/15/20 0232 10/18/20 0339  WBC 13.3* 12.0* 10.4  HGB 8.6* 8.4* 8.0*  HCT 25.4* 25.1* 24.7*  MCV 90.1 92.3 92.9  PLT 332 337 397    Cardiac Enzymes: Recent Labs  Lab 10/19/20 0201  CKTOTAL 272    CBG: No results for input(s): GLUCAP in the last 168 hours.  Iron Studies: No results for input(s): IRON, TIBC, TRANSFERRIN, FERRITIN in the last 72 hours. Studies/Results: No results found.  Medications: Infusions:    Scheduled Medications:  sodium chloride   Intravenous Once   Chlorhexidine Gluconate Cloth  6 each Topical Q0600   docusate sodium  100 mg Oral BID   gabapentin  100 mg Oral QHS   heparin  5,000 Units Subcutaneous Q8H   metoprolol tartrate  25 mg Oral BID   polyethylene glycol  17 g Oral BID   sevelamer carbonate  800 mg Oral TID WC   Vitamin D (Ergocalciferol)  50,000 Units  Oral Q7 days    have reviewed scheduled and prn medications.  Physical Exam: General: Able to lie flat, comfortable, not in distress Heart:RRR, s1s2 nl Lungs: Clear bilateral, no wheezing or crackle Abdomen:soft, Non-tender, non-distended Extremities: No lower extremity edema. Dialysis Access: Right IJ TDC in place  Mikaeel Petrow [facility] 10/21/2020,11:00 AM  LOS: 23 days

## 2020-10-21 NOTE — Progress Notes (Signed)
[facility] Progress Note  Patient: Brett Butler    UTM:[id number]  DOA: 09/28/2020     Date of Service: the patient was seen and examined on 10/21/2020  Brief hospital course: This 55 years old male with history of polysubstance abuse including opiates, cocaine, cannabis, schizoaffective disorder presented in the ED after he was being found lethargic by his friend.  Patient was on the floor for unknown duration.  Patient responded to Narcan when EMS arrived.  On arrival to the hospital patient was noted to have severe AKI and evidence of severe rhabdomyolysis. AKI didn't improve with IV hydration.  Nephrology consulted and patient started on hemodialysis.  Currently plan is watchful waiting for renal recovery without HD.  Subjective: No acute complaint.  No nausea no vomiting.  Continues to have pain on his left leg.  No chest pain abdominal pain.  No diarrhea no constipation.  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 Urinalysis with significant hemoglobin, no RBCs. Nephrology consulted,  temporary HD catheter placed on 8/18 followed by dialysis 8/19. He has been getting dialysis per TTS schedule. Euvolemic on exam.  Output significant for last few days Nephrology currently holding HD given no indication. Patient currently likely going from postobstructive diuresis. Renal function improving as well.  Monitor. Nephrology planning to remove HD catheter if renal function continues to improve.   Nontraumatic rhabdomyolysis :  Resolved. Secondary to prolonged time lying down. Patient had significant amount of edema noted on imaging,  Orthopedics was consulted recommended no surgical management. CK trended down 272 ,  AST ALT trending down.   Acute anemia: Unknown etiology, No evidence of bleed. Possibly related to prior left  thigh hematoma with possible persistent bleed. Patient has received 2 units of PRBCs. Hemoglobin remained stable.  No hematoma on ultrasound.   Left leg weakness / Neuropathic pain: This could be in the setting of prolonged time on the floor,  likely sciatic nerve damage. PT and OT recommending CIR,  Tolerating gabapentin now.  Continue. There is no Achilles contracture, Ortho recommended PRAFO (foot drop splint).   Paroxysmal atrial fibrillation: Heart rate now controlled, TTE unremarkable.   Continue metoprolol.  No prior history.  Likely in the setting of dehydration on admission.  Currently no need for anticoagulation.   Hyperkalemia:  Due to AKI, treated with [facility], now resolved.   Hyponatremia: Resolved.   Hypocalcemia: Now hypercalcemia Improved with calcium and vitamin D supplementation. Discontinue vitamin D supplementation as well.   Elevated troponin: In the setting of rhabdomyolysis.  Possibly demand ischemia. Patient denied any chest pain.   Cocaine abuse: Patient reports he snorts cocaine.  He thinks the cocaine may have been laced with fentanyl.   Social worker consulted.   Constipation Continue MiraLAX and Dulcolax suppository:  Scheduled Meds:  sodium chloride   Intravenous Once   Chlorhexidine Gluconate Cloth  6 each Topical Q0600   docusate sodium  100 mg Oral BID   gabapentin  200 mg Oral QHS   heparin  5,000 Units Subcutaneous Q8H   metoprolol tartrate  25 mg Oral BID   polyethylene glycol  17 g Oral BID   sevelamer carbonate  800 mg Oral TID WC   Vitamin D (Ergocalciferol)  50,000 Units Oral Q7 days   Continuous Infusions: PRN Meds: LORazepam, metoprolol tartrate, oxyCODONE  Body mass index is 23.83 kg/m.  DVT Prophylaxis:   heparin injection 5,000 Units Start: 10/02/20 1500 Place and maintain sequential compression device Start: 09/29/20 1548    Advance goals of care discussion: Pt is Full code.  Family Communication: no  family was present at bedside, at the time of interview.   Data Reviewed: I have personally reviewed and interpreted daily labs, tele strips, imaging. Serum creatinine improving.  Calcium level elevated.  Continues to have excess urine output.  Physical Exam:  General: Appear in mild distress, no Rash; Oral Mucosa Clear, moist. no Abnormal Neck Mass Or lumps, Conjunctiva normal  Cardiovascular: S1 and S2 Present, no Murmur, Respiratory: good respiratory effort, Bilateral Air entry present and CTA, no Crackles, no wheezes Abdomen: Bowel Sound present, Soft and no tenderness Extremities: no Pedal edema Neurology: alert and oriented to time, place, and person affect appropriate. no new focal deficit Gait not checked due to patient safety concerns   Vitals:   10/21/20 0407 10/21/20 0751 10/21/20 1208 10/21/20 1605  BP: 134/83 (!) 146/80 137/83 116/77  Pulse: 67 76 67 68  Resp:  13 16 15   Temp: 98.4 F (36.9 C) 99.3 F (37.4 C) 98.5 F (36.9 C) 98.4 F (36.9 C)  TempSrc: Oral Oral Oral Oral  SpO2: 93% 94% 93% 98%  Weight:      Height:        Disposition:  Status is: Inpatient  Remains inpatient appropriate because:Ongoing diagnostic testing needed not appropriate for outpatient work up  Dispo: The patient is from: Home              Anticipated d/c is to: Home              Patient currently is not medically stable to d/c.   Difficult to place patient No  Time spent: 35 minutes. I reviewed all nursing notes, pharmacy notes, vitals, pertinent old records. I have discussed plan of care as described above with RN.  Author: , MD [facility] 10/21/2020 5:43 PM  To reach On-call, see care teams to locate the attending and reach out via [email or web address]. Between 7PM-7AM, please contact night-coverage If you still have difficulty reaching the attending provider, please page the [facility] (Director on Call) for [facility] on amion for assistance.

## 2020-10-22 ENCOUNTER — Other Ambulatory Visit (HOSPITAL_COMMUNITY): Payer: Self-pay

## 2020-10-22 ENCOUNTER — Inpatient Hospital Stay (HOSPITAL_COMMUNITY): Payer: Medicaid Other

## 2020-10-22 HISTORY — PX: IR REMOVAL TUN CV CATH W/O FL: IMG2289

## 2020-10-22 LAB — RENAL FUNCTION PANEL
Albumin: 2.9 g/dL — ABNORMAL LOW (ref 3.5–5.0)
Anion gap: 11 (ref 5–15)
BUN: 43 mg/dL — ABNORMAL HIGH (ref 6–20)
CO2: 26 mmol/L (ref 22–32)
Calcium: 10.4 mg/dL — ABNORMAL HIGH (ref 8.9–10.3)
Chloride: 102 mmol/L (ref 98–111)
Creatinine, Ser: 4.52 mg/dL — ABNORMAL HIGH (ref 0.61–1.24)
GFR, Estimated: 14 mL/min — ABNORMAL LOW (ref >60)
Glucose, Bld: 85 mg/dL (ref 70–99)
Phosphorus: 5.6 mg/dL — ABNORMAL HIGH (ref 2.5–4.6)
Potassium: 3.8 mmol/L (ref 3.5–5.1)
Sodium: 139 mmol/L (ref 135–145)

## 2020-10-22 MED ORDER — SEVELAMER CARBONATE 800 MG PO TABS
800.0000 mg | ORAL_TABLET | Freq: Three times a day (TID) | ORAL | 0 refills | Status: DC
  Filled 2020-10-22: qty 90, 30d supply, fill #0

## 2020-10-22 MED ORDER — DOCUSATE SODIUM 100 MG PO CAPS
100.0000 mg | ORAL_CAPSULE | Freq: Two times a day (BID) | ORAL | 0 refills | Status: DC | PRN
  Filled 2020-10-22: qty 10, 5d supply, fill #0

## 2020-10-22 MED ORDER — LIDOCAINE HCL (PF) 1 % IJ SOLN
INTRAMUSCULAR | Status: DC | PRN
  Administered 2020-10-22: 5 mL via SUBCUTANEOUS

## 2020-10-22 MED ORDER — LIDOCAINE HCL 1 % IJ SOLN
INTRAMUSCULAR | Status: AC
  Filled 2020-10-22: qty 20

## 2020-10-22 MED ORDER — METOPROLOL TARTRATE 25 MG PO TABS
25.0000 mg | ORAL_TABLET | Freq: Two times a day (BID) | ORAL | 0 refills | Status: DC
  Filled 2020-10-22: qty 60, 30d supply, fill #0

## 2020-10-22 MED ORDER — GABAPENTIN 100 MG PO CAPS
200.0000 mg | ORAL_CAPSULE | Freq: Every day | ORAL | 0 refills | Status: DC
  Filled 2020-10-22: qty 30, 15d supply, fill #0

## 2020-10-22 NOTE — Procedures (Signed)
Successful removal of right IJ tunneled HD catheter.   After obtaining consent and performing a time-out, the right upper chest was prepped and draped in the normal sterile fashion. The heparin was removed from both ports. 1% lidocaine was used for local anesthesia. Using gentle blunt dissection and moderate manual traction the cuff of the catheter was exposed and the catheter was removed in its entirety. Pressure was held until hemostasis was obtained. A sterile dressing was applied. The patient tolerated the procedure well with no immediate complications.   Alwyn Ren, [place] [phone] 10/22/2020, 9:34 AM

## 2020-10-22 NOTE — Plan of Care (Signed)
  Problem: Education: Goal: Knowledge of General Education information will improve Description: Including pain rating scale, medication(s)/side effects and non-pharmacologic comfort measures 10/22/2020 0823 by Mayford Knife, RN Outcome: Progressing 10/22/2020 0823 by Mayford Knife, RN Outcome: Progressing   Problem: Health Behavior/Discharge Planning: Goal: Ability to manage health-related needs will improve 10/22/2020 0823 by Mayford Knife, RN Outcome: Progressing 10/22/2020 0823 by Mayford Knife, RN Outcome: Progressing   Problem: Clinical Measurements: Goal: Ability to maintain clinical measurements within normal limits will improve 10/22/2020 0823 by Mayford Knife, RN Outcome: Progressing 10/22/2020 0823 by Mayford Knife, RN Outcome: Progressing Goal: Will remain free from infection 10/22/2020 0823 by Mayford Knife, RN Outcome: Progressing 10/22/2020 0823 by Mayford Knife, RN Outcome: Progressing Goal: Diagnostic test results will improve 10/22/2020 0823 by Mayford Knife, RN Outcome: Progressing 10/22/2020 0823 by Mayford Knife, RN Outcome: Progressing Goal: Respiratory complications will improve 10/22/2020 0823 by Mayford Knife, RN Outcome: Progressing 10/22/2020 0823 by Mayford Knife, RN Outcome: Progressing Goal: Cardiovascular complication will be avoided 10/22/2020 0823 by Mayford Knife, RN Outcome: Progressing 10/22/2020 0823 by Mayford Knife, RN Outcome: Progressing   Problem: Activity: Goal: Risk for activity intolerance will decrease 10/22/2020 0823 by Mayford Knife, RN Outcome: Progressing 10/22/2020 0823 by Mayford Knife, RN Outcome: Progressing   Problem: Nutrition: Goal: Adequate nutrition will be maintained 10/22/2020 0823 by Mayford Knife, RN Outcome: Progressing 10/22/2020 0823 by Mayford Knife, RN Outcome: Progressing   Problem: Coping: Goal: Level of anxiety will decrease 10/22/2020 0823 by Mayford Knife, RN Outcome: Progressing 10/22/2020 0823 by Mayford Knife,  RN Outcome: Progressing   Problem: Elimination: Goal: Will not experience complications related to bowel motility 10/22/2020 0823 by Mayford Knife, RN Outcome: Progressing 10/22/2020 0823 by Mayford Knife, RN Outcome: Progressing Goal: Will not experience complications related to urinary retention 10/22/2020 0823 by Mayford Knife, RN Outcome: Progressing 10/22/2020 0823 by Mayford Knife, RN Outcome: Progressing   Problem: Pain Managment: Goal: General experience of comfort will improve 10/22/2020 0823 by Mayford Knife, RN Outcome: Progressing 10/22/2020 0823 by Mayford Knife, RN Outcome: Progressing   Problem: Safety: Goal: Ability to remain free from injury will improve 10/22/2020 0823 by Mayford Knife, RN Outcome: Progressing 10/22/2020 0823 by Mayford Knife, RN Outcome: Progressing   Problem: Skin Integrity: Goal: Risk for impaired skin integrity will decrease 10/22/2020 0823 by Mayford Knife, RN Outcome: Progressing 10/22/2020 0823 by Mayford Knife, RN Outcome: Progressing

## 2020-10-22 NOTE — Progress Notes (Signed)
Occupational Therapy Treatment Patient Details Name: Paxton Binns MRN: [id number] DOB: 1966-01-01 Today's Date: 10/22/2020    History of present illness Pt is a 55 y.o. male who presented 09/28/20 with lethargy s/p fall after cocaine use. pt found to have ARF and L lower extremity myositis with rhabdomyolosis and possible sciatic nerve damage. PMH: polysubstance abuse   OT comments  This 55 yo male admitted with above presents to acute OT making progress with new AFO/tennis shoe (but unable to donn/doff it himself--reports sister can help him). He will continue to benefit from OT.  Follow Up Recommendations  Home health OT;Supervision/Assistance - 24 hour    Equipment Recommendations  3 in 1 bedside commode;Wheelchair (measurements OT);Wheelchair cushion (measurements OT)       Precautions / Restrictions Precautions Precautions: Fall Precaution Comments: back precautions to manage pain Restrictions Weight Bearing Restrictions: No       Mobility Bed Mobility Overal bed mobility: Independent                  Transfers Overall transfer level: Needs assistance Equipment used: Rolling walker (2 wheeled) Transfers: Sit to/from Stand Sit to Stand: Supervision         General transfer comment: Pt appeared to able to put more weight through LLE today and did not c/o of pain today--using new AFO with tennis shoes today    Balance Overall balance assessment: Needs assistance Sitting-balance support: No upper extremity supported;Feet supported Sitting balance-Leahy Scale: Good     Standing balance support: Bilateral upper extremity supported;During functional activity Standing balance-Leahy Scale: Poor                             ADL either performed or assessed with clinical judgement   ADL Overall ADL's : Needs assistance/impaired     Grooming: Set up;Sitting Grooming Details (indicate cue type and reason): Tried to get pt to go to bathroom and stand  to brush his teeth--"I'm too lazy"               Lower Body Dressing Details (indicate cue type and reason): total A to don left sock, AFO, and shoe (says his sister can A him with this)--he prefers to put AFO on, then slip it into his shoe. He said he would just sleep in it--he was told he should not do this due to potential skin breakdown and pressure areas--he verbalized understanding Toilet Transfer: Supervision/safety;Ambulation;RW Toilet Transfer Details (indicate cue type and reason): Left AFO with tennis shoes (simulated bed>window>back to bed)                 Vision Baseline Vision/History: 0 No visual deficits Patient Visual Report: No change from baseline            Cognition Arousal/Alertness: Awake/alert Behavior During Therapy: WFL for tasks assessed/performed Overall Cognitive Status: No family/caregiver present to determine baseline cognitive functioning                                 General Comments: Pt has his "own way" of doing things--sometimes not the best or safest way but it usually does work.                   Pertinent Vitals/ Pain       Pain Assessment:  (no c/o today)         Frequency  Min  2X/week        Progress Toward Goals  OT Goals(current goals can now be found in the care plan section)  Progress towards OT goals: Progressing toward goals  Acute Rehab OT Goals Patient Stated Goal: to go home today OT Goal Formulation: With patient Time For Goal Achievement: 10/16/20 Potential to Achieve Goals: Good  Plan Discharge plan remains appropriate    AM-PAC OT "6 Clicks" Daily Activity     Outcome Measure   Help from another person eating meals?: None Help from another person taking care of personal grooming?: A Little Help from another person toileting, which includes using toliet, bedpan, or urinal?: A Little Help from another person bathing (including washing, rinsing, drying)?: A Little Help from  another person to put on and taking off regular upper body clothing?: A Little Help from another person to put on and taking off regular lower body clothing?: A Lot 6 Click Score: 18    End of Session Equipment Utilized During Treatment: Rolling walker;Gait belt  OT Visit Diagnosis: Unsteadiness on feet (R26.81);Muscle weakness (generalized) (M62.81)   Activity Tolerance Patient tolerated treatment well   Patient Left in bed;with call bell/phone within reach   Nurse Communication Mobility status        Time: 1950-9326 OT Time Calculation (min): 24 min  Charges: OT General Charges $OT Visit: 1 Visit OT Treatments $Self Care/Home Management : 8-22 mins  Ignacia Palma, OTR/L Acute [employer] Pager [phone] Office [phone]     Evette Georges 10/22/2020, 3:58 PM

## 2020-10-22 NOTE — Progress Notes (Signed)
Physical Therapy Treatment Patient Details Name: Brett Butler MRN: [id number] DOB: Jun 27, 1965 Today's Date: 10/22/2020    History of Present Illness Pt is a 55 y.o. male who presented 09/28/20 with lethargy s/p fall after cocaine use. pt found to have ARF and L lower extremity myositis with rhabdomyolosis and possible sciatic nerve damage. PMH: polysubstance abuse    PT Comments    Session focused on gait training with L AFO and tennis shoes. Patient requires supervision for mobility for safety. Patient in good spirits about returning home this date. D/c plan remains appropriate.     Follow Up Recommendations  Supervision for mobility/OOB;Outpatient PT;Home health PT     Equipment Recommendations  Rolling Mayes Sangiovanni with 5" wheels;Wheelchair (measurements PT);Wheelchair cushion (measurements PT);3in1 (PT)    Recommendations for Other Services       Precautions / Restrictions Precautions Precautions: Fall Precaution Comments: back precautions to manage pain Restrictions Weight Bearing Restrictions: No    Mobility  Bed Mobility Overal bed mobility: Independent                  Transfers Overall transfer level: Needs assistance Equipment used: Rolling [place]annah Morford (2 wheeled) Transfers: Sit to/from Stand Sit to Stand: Supervision         General transfer comment: Pt appeared to able to put more weight through LLE today and did not c/o of pain today--using new AFO with tennis shoes today  Ambulation/Gait Ambulation/Gait assistance: Supervision Gait Distance (Feet): 20 Feet Assistive device: Rolling Jacquelene Kopecky (2 wheeled) Gait Pattern/deviations: Step-to pattern;Decreased step length - left;Decreased weight shift to left;Decreased dorsiflexion - left;Decreased stride length;Trunk flexed Gait velocity: decreased   General Gait Details: improved gait quality with use of L AFO and tennis shoes. Supervision for safety, no physical assistance required   Stairs              Wheelchair Mobility    Modified Rankin (Stroke Patients Only)       Balance Overall balance assessment: Needs assistance Sitting-balance support: No upper extremity supported;Feet supported Sitting balance-Leahy Scale: Good     Standing balance support: Bilateral upper extremity supported;During functional activity Standing balance-Leahy Scale: Poor Standing balance comment: reliant on UE support and external assist                            Cognition Arousal/Alertness: Awake/alert Behavior During Therapy: WFL for tasks assessed/performed Overall Cognitive Status: No family/caregiver present to determine baseline cognitive functioning                                 General Comments: Pt has his "own way" of doing things--sometimes not the best or safest way but it usually does work.      Exercises      General Comments        Pertinent Vitals/Pain Pain Assessment: No/denies pain    Home Living                      Prior Function            PT Goals (current goals can now be found in the care plan section) Acute Rehab PT Goals Patient Stated Goal: to go home today PT Goal Formulation: With patient Time For Goal Achievement: 10/28/20 Potential to Achieve Goals: Good Progress towards PT goals: Progressing toward goals    Frequency    Min 3X/week  PT Plan Current plan remains appropriate    Co-evaluation              AM-PAC PT "6 Clicks" Mobility   Outcome Measure  Help needed turning from your back to your side while in a flat bed without using bedrails?: A Little Help needed moving from lying on your back to sitting on the side of a flat bed without using bedrails?: A Little Help needed moving to and from a bed to a chair (including a wheelchair)?: A Little Help needed standing up from a chair using your arms (e.g., wheelchair or bedside chair)?: A Little Help needed to walk in hospital room?: A  Little Help needed climbing 3-5 steps with a railing? : A Little 6 Click Score: 18    End of Session   Activity Tolerance: Patient tolerated treatment well Patient left: in bed;with call bell/phone within reach Nurse Communication: Mobility status PT Visit Diagnosis: Other abnormalities of gait and mobility (R26.89);Unsteadiness on feet (R26.81);Muscle weakness (generalized) (M62.81);History of falling (Z91.81);Difficulty in walking, not elsewhere classified (R26.2);Other symptoms and signs involving the nervous system (R29.898);Pain Pain - Right/Left: Left Pain - part of body: Leg     Time: 5520-8022 PT Time Calculation (min) (ACUTE ONLY): 24 min  Charges:  $Gait Training: 8-22 mins                     Brett Butler A. Dan Humphreys PT, DPT Acute Rehabilitation Services Pager [phone] Office [phone]    Brett Butler 10/22/2020, 5:01 PM

## 2020-10-22 NOTE — Progress Notes (Addendum)
[facility] NEPHROLOGY PROGRESS NOTE  Assessment/ Plan:  # Acute kidney injury: This is secondary to pigment nephropathy/rhabdomyolysis.  Started HD on 8/19.  Right IJ TDC placed on 8/25.  He was getting dialysis per TTS schedule and the last treatment was on 9/3.  He is having renal recovery with increasing urine output and creatinine level continue to improve to 4.5 today.  He is euvolemic on exam and has no uremic symptoms.  No need for dialysis.  IR was consulted today and tunneled catheter was discontinued.  I have made appointment for him at [facility] office with me on 11/11/2020 to arrive at 8 AM.  Recommend to check renal panel in a week with PCP.  # Hyperkalemia: Secondary to rhabdomyolysis and associated acute kidney injury.  Improved now.  #  Rhabdomyolysis, nontraumatic: Secondary to prolonged dependent posturing of the left lower extremity following drug use.    #A. fib/flutter, on metoprolol.  Monitor heart rate.  #Hypertension: Monitor blood pressure.  # Polysubstance abuse: Educated about the need for cessation/abstinence.  Sign off, please call back with question.  Discussed with the primary team.  Subjective: Seen and examined at bedside.  Urine output 2.5 L.  Came back from IR after removal of tunneled catheter.  He denies nausea, vomiting, chest pain, shortness of breath.  Objective Vital signs in last 24 hours: Vitals:   10/21/20 2347 10/21/20 2350 10/22/20 0312 10/22/20 0857  BP:  136/75 133/75 125/76  Pulse:  (!) 57 63 66  Resp: 20 18  14   Temp: 98.2 F (36.8 C) 98.2 F (36.8 C) 98.2 F (36.8 C) 98.7 F (37.1 C)  TempSrc: Oral Oral    SpO2:  99%  91%  Weight:      Height:       Weight change:   Intake/Output Summary (Last 24 hours) at 10/22/2020 0958 Last data filed at 10/22/2020 0803 Gross per 24 hour  Intake 400 ml  Output 2700 ml  Net -2300 ml        Labs: Basic Metabolic Panel: Recent Labs  Lab 10/20/20 0203  10/21/20 0345 10/22/20 0149  NA 140 140 139  K 4.0 3.6 3.8  CL 101 101 102  CO2 27 27 26   GLUCOSE 90 88 85  BUN 45* 42* 43*  CREATININE 5.55* 4.88* 4.52*  CALCIUM 10.1 10.7* 10.4*  PHOS 6.9* 6.2* 5.6*    Liver Function Tests: Recent Labs  Lab 10/20/20 0203 10/21/20 0345 10/22/20 0149  ALBUMIN 2.7* 2.7* 2.9*    No results for input(s): LIPASE, AMYLASE in the last 168 hours. No results for input(s): AMMONIA in the last 168 hours. CBC: Recent Labs  Lab 10/18/20 0339  WBC 10.4  HGB 8.0*  HCT 24.7*  MCV 92.9  PLT 397    Cardiac Enzymes: Recent Labs  Lab 10/19/20 0201  CKTOTAL 272    CBG: No results for input(s): GLUCAP in the last 168 hours.  Iron Studies: No results for input(s): IRON, TIBC, TRANSFERRIN, FERRITIN in the last 72 hours. Studies/Results: No results found.  Medications: Infusions:    Scheduled Medications:  sodium chloride   Intravenous Once   Chlorhexidine Gluconate Cloth  6 each Topical Q0600   docusate sodium  100 mg Oral BID   gabapentin  200 mg Oral QHS   heparin  5,000 Units Subcutaneous Q8H   lidocaine       metoprolol tartrate  25 mg Oral BID   polyethylene glycol  17 g Oral BID  sevelamer carbonate  800 mg Oral TID WC    have reviewed scheduled and prn medications.  Physical Exam: General: Not in distress, comfortable. Heart:RRR, s1s2 nl Lungs: Clear bilateral, no wheezing or crackle Abdomen:soft, Non-tender, non-distended Extremities: No LE edema. Dialysis Access: The HD catheter was removed, dressing in place no sign of bleeding  Brett Butler 10/22/2020,9:58 AM  LOS: 24 days

## 2020-10-22 NOTE — TOC Transition Note (Addendum)
Transition of Care (TOC) - CM/SW Discharge Note Donn Pierini RN,BSN Transitions of Care Unit 4NP (non trauma) - RN Case Manager See Treatment Team for direct Phone #    Patient Details  Name: Brett Butler MRN: [id number] Date of Birth: 09-Oct-1965  Transition of Care [facility]) CM/SW Contact:  Darrold Span, RN Phone Number: 10/22/2020, 3:37 PM   Clinical Narrative:    Pt stable for transition home today, renal has stopped iHD and will f/u outpt. Orders have been placed for [facility] and DME needs. Pt currently not in room.  Pt is un-insured and therefor charity [facility] referral made to this weeks covering agency- [facility]- spoke with Kennyth Arnold regarding referral- Kennyth Arnold will process request and see if pt is eligible for charity [facility] program for HHPT.  1600- received return call from Stacy at [facility]- they are not able to accept for [facility] referral- due to polysubstance abuse hx and staffing. Will refer to [facility] outpt PT/OT should pt want to f/u for therapy needs.   Noted DME orders for both RW and w/c- pt can only get one or the other as charity coverage is like insurance it does not cover both- will order the W/C for pt based on therapy notes and bedside RN input. Call made to [employer] for DME charity referral for w/c. - Wheelchair to be delivered to room prior to discharge.        Barriers to Discharge: Inadequate or no insurance   Patient Goals and CMS Choice Patient states their goals for this hospitalization and ongoing recovery are:: return home   Choice offered to / list presented to : NA (no insurance- charity referral)  Discharge Placement               Home        Discharge Plan and Services In-house Referral: Clinical Social Work Discharge Planning Services: CM Consult Post Acute Care Choice: Horticulturist, commercial, Home Health          DME Arranged: Wheelchair manual DME Agency: [facility] Date DME Agency Contacted: 10/22/20 Time DME Agency Contacted:  1535 Representative spoke with at DME Agency: Velna Hatchet HH Arranged: PT HH Agency: [facility] Date [facility] Agency Contacted: 10/22/20 Time HH Agency Contacted: 1536 Representative spoke with at [facility] Agency: Stacy  Social Determinants of Health (SDOH) Interventions     Readmission Risk Interventions Readmission Risk Prevention Plan 10/22/2020  Transportation Screening Complete  PCP or Specialist Appt within 3-5 Days Complete  HRI or Home Care Consult Complete  Social Work Consult for Recovery Care Planning/Counseling Complete  Palliative Care Screening Not Applicable  Medication Review [employer]) Complete  Some recent data might be hidden

## 2020-10-25 NOTE — Discharge Summary (Signed)
[facility] Discharge Summary   Patient: Brett Butler ZOX:[id number]  PCP: Lavinia Sharps, NP  Date of admission: 09/28/2020   Date of discharge: 10/22/2020       Discharge Diagnoses:  Principal Problem:   ARF (acute renal failure) (HCC) Active Problems:   Rhabdomyolysis   Cocaine abuse (HCC)   Hyperkalemia   Leukocytosis   Injury of left sciatic nerve   Admitted From: home Disposition:  Home   Recommendations for Outpatient Follow-up:  PCP: follow up in 1 week with PCP and Nephrology  Follow up LABS/TEST:  BMP  Follow-up Information     Placey, Chales Abrahams, NP. Schedule an appointment as soon as possible for a visit in 1 week(s).   Contact information: [address] [place] [address] [phone]         Maxie Barb, MD. Schedule an appointment as soon as possible for a visit in 1 week(s).   Specialties: Nephrology, Internal Medicine Why: with CBC and BMP Contact information: [address] [place] [address] [phone]         [facility] Follow up.   Why: wheelchair arranged- to be delivered to the room prior to discharge Contact information: [address] [place] [address] [phone]         [facility] Follow up.   Specialty: Rehabilitation Why: referral made for outpt PT/OT- you can call to check on scheduling and cost if you want to do therapy. Contact information: [address] [id number] [place] [address] [phone]               Discharge Instructions     Diet - low sodium heart healthy   Complete by: As directed    Increase activity slowly   Complete by: As directed    No wound care   Complete by: As directed        Diet recommendation: Renal diet  Activity: The patient is advised to gradually reintroduce usual activities, as tolerated  Discharge Condition: stable  Code Status: Full code   History of present  illness: As per the H and P dictated on admission, "Brett Butler is a 55 y.o. male with history of polysubstance abuse including cocaine was found to be lethargic after EMS was called by patient's friends.  Patient was given Narcan following which patient became more alert awake.  Patient states he snorted cocaine following which she fell onto the floor and had remained so for almost 12 hours.  Later on EMS was called and given Narcan and was brought to the ER.   ED Course: In the ER patient was noticed to not moving his left lower extremity and labs show markedly elevated CK levels more than 50,000 lactic acid of 5.1 WBC count was 27,000 LFTs were elevated with AST of 200 and ALT of 5200 total bilirubin was normal.  Potassium was 6.5 EKG showing peaked T waves.  Patient's creatinine about last year in June was 1.04.  Patient was given temporizing measures for the hyperkalemia including calcium gluconate D50 insulin and Lokelma.  Patient underwent CT scan of the abdomen and left lower extremity which showed swelling of the left and external hamstring muscles concerning for myositis.  And also there was some concern for compartment syndrome.  Dr. Linna Caprice orthopedic surgeons was consulted and per orthopedic surgeon pressures checked did not show any signs of compartment syndrome.  But requested getting MRI of the L-spine since patient was complaining  of low back pain and on my exam patient does have tenderness in the low back.  Patient unable to move his left lower extremity with no sensation in the distal half of the left lower extremity.  Good pulses.  COVID test was negative."  Hospital Course:  Summary of his active problems in the hospital is as following. 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 Urinalysis with significant hemoglobin, no RBCs. Nephrology consulted,   temporary HD catheter placed on 8/18 followed by dialysis 8/19. He has been getting dialysis per TTS schedule. Euvolemic on exam.  Output significant for last few days Nephrology currently holding HD given no indication. Renal function improving as well.  Monitor. Nephrology removed HD catheter. Outpt follow up recommended    Nontraumatic rhabdomyolysis :  Resolved. Secondary to prolonged time lying down. Patient had significant amount of edema noted on imaging,  Orthopedics was consulted recommended no surgical management. CK trended down 272 ,  AST ALT trending down.   Acute anemia: Unknown etiology, No evidence of bleed. Possibly related to prior left thigh hematoma with possible persistent bleed. Patient has received 2 units of PRBCs. Hemoglobin remained stable.  No hematoma on ultrasound.   Left leg weakness / Neuropathic pain: This could be in the setting of prolonged time on the floor,  likely sciatic nerve damage. PT and OT recommending CIR,  Tolerating gabapentin now.  Continue. There is no Achilles contracture, Ortho recommended PRAFO (foot drop splint).   Paroxysmal atrial fibrillation: Heart rate now controlled, TTE unremarkable.   Continue metoprolol.  No prior history.  Likely in the setting of dehydration on admission.  Currently no need for anticoagulation.   Hyperkalemia:  Due to AKI, treated with [facility], now resolved.   Hyponatremia: Resolved.   Hypocalcemia: Now hypercalcemia Improved with calcium and vitamin D supplementation. Discontinue vitamin D supplementation as well.   Elevated troponin: In the setting of rhabdomyolysis.  Possibly demand ischemia. Patient denied any chest pain.   Cocaine abuse: Patient reports he snorts cocaine.  He thinks the cocaine may have been laced with fentanyl.   Counseled pt against abuse Child psychotherapist consulted.   Constipation Continue MiraLAX and Dulcolax suppository:  Patient was seen by physical therapy, who  recommended Home Health,. On the day of the discharge the patient's vitals were stable, and no other new acute medical condition were reported. The patient was felt safe to be discharge at Home with Therapy.  Consultants: Nephrology IR Procedures: IR guided HD catheter placement and removal.  HD  DISCHARGE MEDICATION: Allergies as of 10/22/2020       Reactions   Other Hives, Rash   peaches        Medication List     STOP taking these medications    cyclobenzaprine 10 MG tablet Commonly known as: FLEXERIL   DULoxetine 30 MG capsule Commonly known as: Cymbalta   loratadine 10 MG tablet Commonly known as: CLARITIN   lurasidone 40 MG Tabs tablet Commonly known as: LATUDA       TAKE these medications    docusate sodium 100 MG capsule Commonly known as: COLACE Take 1 capsule (100 mg total) by mouth 2 (two) times daily as needed for mild constipation.   gabapentin 100 MG capsule Commonly known as: NEURONTIN Take 2 capsules (200 mg total) by mouth at bedtime.   metoprolol tartrate 25 MG tablet Commonly known as: LOPRESSOR  Take 1 tablet (25 mg total) by mouth 2 (two) times daily.   sevelamer carbonate 800 MG tablet Commonly known as: RENVELA Take 1 tablet (800 mg total) by mouth 3 (three) times daily with meals.        Discharge Exam: Filed Weights   10/18/20 0500 10/19/20 0500 10/20/20 0500  Weight: 71.1 kg 71.1 kg 71.1 kg   Vitals:   10/22/20 0857 10/22/20 1205  BP: 125/76 128/83  Pulse: 66 68  Resp: 14 18  Temp: 98.7 F (37.1 C) 98.6 F (37 C)  SpO2: 91% 97%   General: Appear in mild distress, no Rash; Oral Mucosa Clear, moist. no Abnormal Neck Mass Or lumps, Conjunctiva normal  Cardiovascular: S1 and S2 Present, no Murmur, Respiratory: good respiratory effort, Bilateral Air entry present and CTA, no Crackles, no wheezes Abdomen: Bowel Sound present, Soft and no tenderness Extremities: no Pedal edema Neurology: alert and oriented to time, place,  and person affect appropriate. no new focal deficit Gait not checked due to patient safety concerns  The results of significant diagnostics from this hospitalization (including imaging, microbiology, ancillary and laboratory) are listed below for reference.    Significant Diagnostic Studies: CT Abdomen Pelvis Wo Contrast  Result Date: 09/28/2020 CLINICAL DATA:  Left leg pain EXAM: CT ABDOMEN AND PELVIS WITHOUT CONTRAST TECHNIQUE: Multidetector CT imaging of the abdomen and pelvis was performed following the standard protocol without IV contrast. COMPARISON:  None. FINDINGS: LOWER CHEST: Bibasilar atelectasis HEPATOBILIARY: Normal hepatic contours. No intra- or extrahepatic biliary dilatation. The gallbladder is normal. PANCREAS: Normal pancreas. No ductal dilatation or peripancreatic fluid collection. SPLEEN: Normal. ADRENALS/URINARY TRACT: The adrenal glands are normal. No hydronephrosis, nephroureterolithiasis or solid renal mass. The urinary bladder is normal for degree of distention STOMACH/BOWEL: There is no hiatal hernia. Normal duodenal course and caliber. No small bowel dilatation or inflammation. No focal colonic abnormality. Normal appendix. VASCULAR/LYMPHATIC: There is calcific atherosclerosis of the abdominal aorta. No lymphadenopathy. REPRODUCTIVE: Normal prostate size with symmetric seminal vesicles. MUSCULOSKELETAL. No bony spinal canal stenosis or focal osseous abnormality. OTHER: Large intramuscular hematoma the left thigh is better characterized on concomitant CT of the left lower extremity. IMPRESSION: 1. Large intramuscular hematoma the left thigh is better characterized on concomitant CT of the left lower extremity. 2. No acute abnormality of the abdomen or pelvis. Aortic Atherosclerosis (ICD10-I70.0). Electronically Signed   By: Deatra RobinsonKevin  Herman M.D.   On: 09/28/2020 20:41   DG Chest 2 View  Result Date: 10/07/2020 CLINICAL DATA:  Acute kidney injury. EXAM: CHEST - 2 VIEW COMPARISON:   11/16/2011 FINDINGS: Heart size is normal. Advanced chronic emphysema. The left chest is clear. Central line in place on the right with the tip in the SVC above the right atrium. There is abnormal density in volume loss in the right lower lobe consistent with pneumonia/atelectasis. No acute bone finding. IMPRESSION: Background emphysema.  Right lower lobe atelectasis/pneumonia. Electronically Signed   By: Paulina FusiMark  Shogry M.D.   On: 10/07/2020 16:44   DG Lumbar Spine Complete  Result Date: 09/28/2020 CLINICAL DATA:  Fall with left leg pain. EXAM: LUMBAR SPINE - COMPLETE 4+ VIEW COMPARISON:  Remote radiograph 04/24/2008 FINDINGS: Again seen transitional lumbosacral anatomy. There is no evidence of fracture. Normal alignment. Vertebral body heights are normal. Minor endplate spurring at multiple levels. Disc space narrowing at the lumbosacral junction. Remaining disc spaces are preserved. Minor lower lumbar facet hypertrophy. Aorto bi-iliac atherosclerosis is age advanced. IMPRESSION: 1. No acute fracture or subluxation of the lumbar  spine. 2. Mild spondylosis with endplate spurring and facet hypertrophy. 3. Age advanced aortoiliac atherosclerosis. Electronically Signed   By: Narda Rutherford M.D.   On: 09/28/2020 17:10   CT HEAD WO CONTRAST ( )  Result Date: 09/29/2020 CLINICAL DATA:  Mental status change, unknown cause EXAM: CT HEAD WITHOUT CONTRAST TECHNIQUE: Contiguous axial images were obtained from the base of the skull through the vertex without intravenous contrast. COMPARISON:  04/09/2013 FINDINGS: Brain: No acute intracranial abnormality. Specifically, no hemorrhage, hydrocephalus, mass lesion, acute infarction, or significant intracranial injury. Vascular: No hyperdense vessel or unexpected calcification. Skull: No acute calvarial abnormality. Sinuses/Orbits: Mucosal thickening in the ethmoid air cells. Air-fluid levels in the maxillary sinuses. Other: None IMPRESSION: No acute intracranial  abnormality. Acute on chronic sinusitis. Electronically Signed   By: Charlett Nose M.D.   On: 09/29/2020 03:17   MR LUMBAR SPINE WO CONTRAST  Result Date: 09/29/2020 CLINICAL DATA:  Low back pain, cauda equina syndrome suspected EXAM: MRI LUMBAR SPINE WITHOUT CONTRAST TECHNIQUE: Multiplanar, multisequence MR imaging of the lumbar spine was performed. No intravenous contrast was administered. COMPARISON:  None. FINDINGS: Segmentation:  Standard. Alignment:  Physiologic. Vertebrae:  Mild discogenic endplate edema at L5 Conus medullaris and cauda equina: Conus extends to the L1 level. Conus and cauda equina appear normal. Paraspinal and other soft tissues: Edema within the right greater than left paraspinous muscles. No abnormal signal within the iliopsoas muscles. Disc levels: No spinal canal stenosis or neural impingement.  No disc herniation. IMPRESSION: 1. Edema within the right greater than left paraspinous muscles. No iliopsoas muscle signal abnormality. 2. Normal alignment. No spinal canal or neural foraminal stenosis. Electronically Signed   By: Deatra Robinson M.D.   On: 09/29/2020 03:32   MR PELVIS WO CONTRAST  Result Date: 09/29/2020 CLINICAL DATA:  Left buttock pain. History of cocaine abuse. Elevated CK and potassium. EXAM: MRI PELVIS WITHOUT CONTRAST TECHNIQUE: Multiplanar multisequence MR imaging of the pelvis was performed. No intravenous contrast was administered. COMPARISON:  CT abdomen pelvis from yesterday. FINDINGS: Bones/Joint/Cartilage No suspicious marrow signal abnormality. No fracture or dislocation. Mild bilateral hip osteoarthritis. No joint effusion. Muscles and Tendons Prominent, patchy muscle edema involving the left gluteal muscles. Milder diffuse edema in the visualized lower paraspinous muscles. Edema within the right quadratus femoris muscle. Edema within the left adductor and hamstring muscles as described on separate MRI left thigh report from same day. Soft tissue No fluid  collection or hematoma. No soft tissue mass. The visualized internal pelvic contents are unremarkable. IMPRESSION: 1. Prominent left gluteal and upper thigh muscle edema most consistent with rhabdomyolysis given clinical history. 2. Isolated edema within the right quadratus femoris muscle may be related to rhabdomyolysis as well, but can also be seen with ischiofemoral impingement 3. Mild bilateral hip osteoarthritis. Electronically Signed   By: Obie Dredge M.D.   On: 09/29/2020 05:17   MR FEMUR LEFT WO CONTRAST  Result Date: 09/29/2020 CLINICAL DATA:  Left thigh swelling. History of cocaine abuse. Elevated CK and potassium. EXAM: MR OF THE LEFT FEMUR WITHOUT CONTRAST TECHNIQUE: Multiplanar, multisequence MR imaging of the left thigh was performed. No intravenous contrast was administered. COMPARISON:  CT left leg from same day. FINDINGS: Bones/Joint/Cartilage No marrow signal abnormality. No fracture or dislocation. No joint effusion. Muscles and Tendons Severe muscle edema diffusely involving the adductor and hamstring muscle compartments, as well as the visualized obturator, gluteus maximus, and gracilis muscles, with associated interfascial fluid. No increased T1 hyperintensity to suggest muscle hemorrhage. Soft tissue  Prominent soft tissue swelling in the medial and posterior thigh. No fluid collection or hematoma. No soft tissue mass. IMPRESSION: 1. Severe muscle edema involving the medial and posterior muscle compartments of the left thigh, most consistent with rhabdomyolysis given clinical history. Electronically Signed   By: Obie Dredge M.D.   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 Patient needs to a tunneled dialysis catheter. EXAM: FLUOROSCOPIC AND ULTRASOUND GUIDED PLACEMENT OF A TUNNELED DIALYSIS CATHETER Physician: Rachelle Hora. Lowella Dandy, MD  MEDICATIONS: Ancef 2 g; The antibiotic was administered within an appropriate time interval prior to skin puncture. ANESTHESIA/SEDATION: Versed 1.0 mg IV; Fentanyl 25 mcg IV; Moderate Sedation Time:  44 minutes The patient was continuously monitored during the procedure by the interventional radiology nurse under my direct supervision. FLUOROSCOPY TIME:  Fluoroscopy Time: 18 seconds, 1 mGy COMPLICATIONS: None immediate. PROCEDURE: The procedure was explained to the patient. The risks and benefits of the procedure were discussed and the patient's questions were addressed. Informed consent was obtained from the patient. The patient was placed supine on the interventional table. The right neck non tunneled dialysis catheter was removed with manual compression. Ultrasound confirmed a patent right internal jugular vein. Ultrasound image obtained for documentation. The right neck and chest was prepped and draped in a sterile fashion. Maximal barrier sterile technique was utilized including caps, mask, sterile gowns, sterile gloves, sterile drape, hand hygiene and skin antiseptic. The right neck was anesthetized with 1% lidocaine. A small incision was made with #11 blade scalpel. A 21 gauge needle directed into the right internal jugular vein with ultrasound guidance. A micropuncture dilator set was placed. A 19 cm tip to cuff Palindrome catheter was selected. The skin below the right clavicle was anesthetized and a small incision was made with an #11 blade scalpel. A subcutaneous tunnel was formed to the vein dermatotomy site. The catheter was brought through the tunnel. The vein dermatotomy site was dilated to accommodate a peel-away sheath. The catheter was placed through the peel-away sheath and directed into the central venous structures. The tip of the catheter was placed at superior cavoatrial junction with fluoroscopy. Fluoroscopic images were obtained for documentation. Both lumens were found to aspirate and flush  well. The proper amount of heparin was flushed in both lumens. The vein dermatotomy site was closed using a single layer of absorbable suture and Dermabond. Gel-Foam was placed in the subcutaneous tract. The catheter was secured to the skin using Prolene suture. IMPRESSION: Successful placement of a right jugular tunneled dialysis catheter using ultrasound and fluoroscopic guidance. Electronically Signed   By: Richarda Overlie M.D.   On: 10/07/2020 11:17   IR Fluoro Guide CV Line Right  Result Date: 09/30/2020 INDICATION: 55 year old male referred for temporary hemodialysis catheter EXAM: IMAGE GUIDED TEMPORARY HEMODIALYSIS CATHETER MEDICATIONS: None ANESTHESIA/SEDATION: None FLUOROSCOPY TIME:  Fluoroscopy Time: 0 minutes 6 seconds (0 mGy). COMPLICATIONS: None PROCEDURE: Informed written consent was obtained from the patient after a thorough discussion of the procedural risks, benefits and alternatives. All questions were addressed. Maximal Sterile Barrier Technique was utilized including caps, mask, sterile gowns, sterile gloves, sterile drape, hand hygiene and skin antiseptic. A timeout was performed prior to the initiation of the procedure. The right neck and chest was prepped with chlorhexidine, and draped in the usual sterile fashion using maximum barrier technique (cap and mask, sterile gown, sterile gloves, large  sterile sheet, hand hygiene and cutaneous antiseptic). Local anesthesia was attained by infiltration with 1% lidocaine without epinephrine. Ultrasound demonstrated patency of the right internal jugular vein, and this was documented with an image. Under real-time ultrasound guidance, this vein was accessed with a 21 gauge micropuncture needle and image documentation was performed. A small dermatotomy was made at the access site with an 11 scalpel. A 0.018" wire was advanced into the SVC and the access needle exchanged for a 33F micropuncture vascular sheath. 035 wire was advanced into the IVC. A 15 cm  catheter was selected. Skin and subcutaneous tissues were serially dilated. Catheter was placed on the wire. The catheter tip is positioned in the upper right atrium. This was documented with a spot image. Both ports of the hemodialysis catheter were then tested for excellent function. The ports were then locked with heparinized lock. Patient tolerated the procedure well and remained hemodynamically stable throughout. No complications were encountered and no significant blood loss was encountered. IMPRESSION: Status post right IJ temp HD catheter placement. Signed, Yvone Neu. Reyne Dumas, RPVI [facility] Electronically Signed   By: Gilmer Mor D.O.   On: 09/30/2020 11:13   IR Removal Tun Cv Cath W/O FL  Result Date: 10/22/2020 INDICATION: Patient with a history of acute kidney injury requiring hemodialysis with tunneled hemodialysis catheter placement in IR. Patient has experienced renal recovery, HD line no longer needed, interventional radiology asked to remove. EXAM: REMOVAL TUNNELED CENTRAL VENOUS CATHETER MEDICATIONS: 1% lidocaine 5 mL ANESTHESIA/SEDATION: None FLUOROSCOPY TIME:  None COMPLICATIONS: None immediate. PROCEDURE: Informed written consent was obtained from the patient after a thorough discussion of the procedural risks, benefits and alternatives. All questions were addressed. Maximal Sterile Barrier Technique was utilized including caps, mask, sterile gowns, sterile gloves, sterile drape, hand hygiene and skin antiseptic. A timeout was performed prior to the initiation of the procedure. The patient's right chest and catheter was prepped and draped in a normal sterile fashion. Heparin was removed from both ports of catheter. 1% lidocaine was used for local anesthesia. Using gentle blunt dissection and moderate manual traction the cuff of the catheter was exposed and the catheter was removed in it's entirety. Pressure was held till  hemostasis was obtained. A sterile dressing was applied. The patient tolerated the procedure well with no immediate complications. IMPRESSION: Successful catheter removal as described above. Read by: Alwyn Ren, NP Electronically Signed   By: Gilmer Mor D.O.   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 Patient needs to a tunneled dialysis catheter. EXAM: FLUOROSCOPIC AND ULTRASOUND GUIDED PLACEMENT OF A TUNNELED DIALYSIS CATHETER Physician: Rachelle Hora. Lowella Dandy, MD MEDICATIONS: Ancef 2 g; The antibiotic was administered within an appropriate time interval prior to skin puncture. ANESTHESIA/SEDATION: Versed 1.0 mg IV; Fentanyl 25 mcg IV; Moderate Sedation Time:  44 minutes The patient was continuously monitored during the procedure by the interventional radiology nurse under my direct supervision. FLUOROSCOPY TIME:  Fluoroscopy Time: 18 seconds, 1 mGy COMPLICATIONS: None immediate. PROCEDURE: The procedure was explained to the patient. The risks and benefits of the procedure were discussed and the patient's questions were addressed. Informed consent was obtained from the patient. The patient was placed supine on the interventional table. The right neck non tunneled dialysis catheter was removed with manual compression. Ultrasound confirmed a patent right internal jugular vein. Ultrasound  image obtained for documentation. The right neck and chest was prepped and draped in a sterile fashion. Maximal barrier sterile technique was utilized including caps, mask, sterile gowns, sterile gloves, sterile drape, hand hygiene and skin antiseptic. The right neck was anesthetized with 1% lidocaine. A small incision was made with #11 blade scalpel. A 21 gauge needle directed into the right internal jugular vein with ultrasound guidance. A micropuncture dilator set was placed. A 19  cm tip to cuff Palindrome catheter was selected. The skin below the right clavicle was anesthetized and a small incision was made with an #11 blade scalpel. A subcutaneous tunnel was formed to the vein dermatotomy site. The catheter was brought through the tunnel. The vein dermatotomy site was dilated to accommodate a peel-away sheath. The catheter was placed through the peel-away sheath and directed into the central venous structures. The tip of the catheter was placed at superior cavoatrial junction with fluoroscopy. Fluoroscopic images were obtained for documentation. Both lumens were found to aspirate and flush well. The proper amount of heparin was flushed in both lumens. The vein dermatotomy site was closed using a single layer of absorbable suture and Dermabond. Gel-Foam was placed in the subcutaneous tract. The catheter was secured to the skin using Prolene suture. IMPRESSION: Successful placement of a right jugular tunneled dialysis catheter using ultrasound and fluoroscopic guidance. Electronically Signed   By: Richarda Overlie M.D.   On: 10/07/2020 11:17   IR US Guide Vasc Access Right  Result Date: 09/30/2020 INDICATION: 55 year old male referred for temporary hemodialysis catheter EXAM: IMAGE GUIDED TEMPORARY HEMODIALYSIS CATHETER MEDICATIONS: None ANESTHESIA/SEDATION: None FLUOROSCOPY TIME:  Fluoroscopy Time: 0 minutes 6 seconds (0 mGy). COMPLICATIONS: None PROCEDURE: Informed written consent was obtained from the patient after a thorough discussion of the procedural risks, benefits and alternatives. All questions were addressed. Maximal Sterile Barrier Technique was utilized including caps, mask, sterile gowns, sterile gloves, sterile drape, hand hygiene and skin antiseptic. A timeout was performed prior to the initiation of the procedure. The right neck and chest was prepped with chlorhexidine, and draped in the usual sterile fashion using maximum barrier technique (cap and mask, sterile gown, sterile  gloves, large sterile sheet, hand hygiene and cutaneous antiseptic). Local anesthesia was attained by infiltration with 1% lidocaine without epinephrine. Ultrasound demonstrated patency of the right internal jugular vein, and this was documented with an image. Under real-time ultrasound guidance, this vein was accessed with a 21 gauge micropuncture needle and image documentation was performed. A small dermatotomy was made at the access site with an 11 scalpel. A 0.018" wire was advanced into the SVC and the access needle exchanged for a 79F micropuncture vascular sheath. 035 wire was advanced into the IVC. A 15 cm catheter was selected. Skin and subcutaneous tissues were serially dilated. Catheter was placed on the wire. The catheter tip is positioned in the upper right atrium. This was documented with a spot image. Both ports of the hemodialysis catheter were then tested for excellent function. The ports were then locked with heparinized lock. Patient tolerated the procedure well and remained hemodynamically stable throughout. No complications were encountered and no significant blood loss was encountered. IMPRESSION: Status post right IJ temp HD catheter placement. Signed, Yvone Neu. Reyne Dumas, RPVI [facility] Radiology Electronically Signed   By: Gilmer Mor D.O.   On: 09/30/2020 11:13   EEG adult  Result Date: 10/08/2020 Charlsie Quest, MD     10/08/2020 11:14 AM Patient Name: Marcy Salvo  Voght MRN: [id number] Epilepsy Attending: Charlsie Quest Referring Physician/Provider: Dr Hulan Amato Date: 10/08/2020 Duration: 23.44 mins Patient history: 55 year old male with an episode of involuntary movements.  EEG to evaluate for seizures. Level of alertness: Awake AEDs during EEG study: None Technical aspects: This EEG study was done with scalp electrodes positioned according to the 10-20 International system of electrode placement. Electrical activity was  acquired at a sampling rate of  and reviewed with a high frequency filter of  and a low frequency filter of . EEG data were recorded continuously and digitally stored. Description: The posterior dominant rhythm consists of 7.5 Hz activity of moderate voltage (25-35 uV) seen predominantly in posterior head regions, symmetric and reactive to eye opening and eye closing. Physiologic photic driving was not seen during photic stimulation.  Hyperventilation was not performed.   Multiple episodes sudden onset head jerking as well as right lower extremity jerking were recorded throughout the study lasting for few seconds.  Concomitant EEG before, during and after the event did not show any EEG changes consistent with seizure. IMPRESSION: This study is within normal limits. No seizures or epileptiform discharges were seen throughout the recording. Multiple episodes sudden onset head jerking as well as right lower extremity jerking were recorded throughout the study lasting for few seconds.  No EEG changes seen during these episodes.  These are NON-EPILEPTIC events Charlsie Quest   ECHOCARDIOGRAM COMPLETE  Result Date: 10/12/2020    ECHOCARDIOGRAM REPORT   Patient Name:   SRICHARAN LACOMB Date of Exam: 10/12/2020 Medical Rec #:  [id number]      Height:       68.0 in Accession #:    [id number]     Weight:       177.7 lb Date of Birth:  Feb 24, 1965     BSA:          1.944 m Patient Age:    54 years       BP:           111/61 mmHg Patient Gender: M              HR:           83 bpm. Exam Location:  Inpatient Procedure: 2D Echo, Cardiac Doppler, Color Doppler and 3D Echo Indications:    Atrial Fibrillation I48.91  History:        Patient has no prior history of Echocardiogram examinations.                 Polysubstance abuse. Acute kidney injury, Elevated                 troponin/demand ischemia, Leukocytosis due to rhabdomyolysis.  Sonographer:    Leta Jungling RDCS Referring Phys: [phone] ABRAHAM FELIZ ORTIZ  IMPRESSIONS  1. Left ventricular ejection fraction, by estimation, is 60 to 65%. Left ventricular ejection fraction by 3D volume is 67 %. The left ventricle has normal function. The left ventricle has no regional wall motion abnormalities. Left ventricular diastolic  parameters were normal.  2. Right ventricular systolic function is normal. The right ventricular size is normal.  3. The mitral valve is normal in structure. No evidence of mitral valve regurgitation. No evidence of mitral stenosis.  4. The aortic valve is normal in structure. Aortic valve regurgitation is not visualized. No aortic stenosis is present.  5. There is borderline dilatation of the aortic root, measuring 39 mm.  6. The inferior vena cava is normal in size with greater  than 50% respiratory variability, suggesting right atrial pressure of 3 mmHg. FINDINGS  Left Ventricle: Left ventricular ejection fraction, by estimation, is 60 to 65%. Left ventricular ejection fraction by 3D volume is 67 %. The left ventricle has normal function. The left ventricle has no regional wall motion abnormalities. The left ventricular internal cavity size was normal in size. There is no left ventricular hypertrophy. Left ventricular diastolic parameters were normal. Normal left ventricular filling pressure. Right Ventricle: The right ventricular size is normal. No increase in right ventricular wall thickness. Right ventricular systolic function is normal. Left Atrium: Left atrial size was normal in size. Right Atrium: Right atrial size was normal in size. Pericardium: There is no evidence of pericardial effusion. Mitral Valve: The mitral valve is normal in structure. No evidence of mitral valve regurgitation. No evidence of mitral valve stenosis. Tricuspid Valve: The tricuspid valve is normal in structure. Tricuspid valve regurgitation is not demonstrated. No evidence of tricuspid stenosis. Aortic Valve: The aortic valve is normal in structure. Aortic valve  regurgitation is not visualized. No aortic stenosis is present. Pulmonic Valve: The pulmonic valve was normal in structure. Pulmonic valve regurgitation is not visualized. No evidence of pulmonic stenosis. Aorta: The aortic root is normal in size and structure. There is borderline dilatation of the aortic root, measuring 39 mm. Venous: The inferior vena cava is normal in size with greater than 50% respiratory variability, suggesting right atrial pressure of 3 mmHg. IAS/Shunts: No atrial level shunt detected by color flow Doppler.  LEFT VENTRICLE PLAX 2D LVIDd:         5.10 cm         Diastology LVIDs:         3.50 cm         LV e' medial:    11.30 cm/s LV PW:         0.80 cm         LV E/e' medial:  6.0 LV IVS:        0.80 cm         LV e' lateral:   18.50 cm/s LVOT diam:     2.30 cm         LV E/e' lateral: 3.7 LV SV:         72 LV SV Index:   37 LVOT Area:     4.15 cm        3D Volume EF                                LV 3D EF:    Left                                             ventricul                                             ar                                             ejection  fraction                                             by 3D                                             volume is                                             67 %.                                 3D Volume EF:                                3D EF:        67 %                                LV EDV:       195 ml                                LV ESV:       63 ml                                LV SV:        131 ml RIGHT VENTRICLE TAPSE (M-mode): 1.9 cm LEFT ATRIUM             Index       RIGHT ATRIUM           Index LA diam:        3.30 cm 1.70 cm/m  RA Area:     10.90 cm LA Vol (A2C):   29.1 ml 14.97 ml/m RA Volume:   19.10 ml  9.83 ml/m LA Vol (A4C):   35.4 ml 18.21 ml/m LA Biplane Vol: 32.3 ml 16.62 ml/m  AORTIC VALVE LVOT Vmax:   94.60 cm/s LVOT Vmean:  61.600 cm/s LVOT VTI:     0.174 m  AORTA Ao Root diam: 3.90 cm MITRAL VALVE MV Area (PHT): 3.89 cm    SHUNTS MV Decel Time: 195 msec    Systemic VTI:  0.17 m MV E velocity: 67.90 cm/s  Systemic Diam: 2.30 cm MV A velocity: 52.10 cm/s MV E/A ratio:  1.30 Mihai Croitoru MD Electronically signed by Thurmon Fair MD Signature Date/Time: 10/12/2020/5:28:38 PM    Final    [place] LT LOWER EXTREM LTD SOFT TISSUE NON VASCULAR  Result Date: 10/13/2020 CLINICAL DATA:  Swelling left thigh. EXAM: ULTRASOUND left LOWER EXTREMITY LIMITED TECHNIQUE: Ultrasound examination of the lower extremity soft tissues was performed in the area of clinical concern. COMPARISON:  None. FINDINGS: No mass or cystic lesion within the evaluated left medial thigh. IMPRESSION: No mass or cystic lesion within the evaluated left medial thigh. Electronically Signed  By: Tish Frederickson M.D.   On: 10/13/2020 20:11   CT EXTREMITY LOWER LEFT WO CONTRAST  Result Date: 09/28/2020 CLINICAL DATA:  Left leg pain and swelling. EXAM: CT OF THE LOWER LEFT EXTREMITY WITHOUT CONTRAST TECHNIQUE: Multidetector CT imaging of the lower left extremity was performed according to the standard protocol. COMPARISON:  None. FINDINGS: There is marked enlargement of the left thigh. the left adductor muscle compartment is markedly enlarged and the muscles appear edematous. There is also surrounding inflammatory changes and fluid. This process continues down into the posterior compartment of the thigh and terminates just above the knee. There is also some associated subcutaneous soft tissue swelling/edema/fluid. The anterior compartment of the knee is unremarkable. I do not see a discrete intramuscular hematoma. This has more the appearance of edema/myositis. Findings could be due to viral myositis, other post infectious myositis, muscle infarcts or drug related. Could not exclude compartment syndrome given the amount of swelling and edema. However, this is a clinical diagnosis. I do not see any  significant findings below the knee. The femur is intact. The tibia and fibula are intact. Joint spaces are maintained. No findings suspicious for septic arthritis or osteomyelitis. Scattered arterial calcifications. IMPRESSION: 1. Marked enlargement of the left adductor and hamstring muscle compartments with surrounding inflammatory changes and intermuscular fluid. This has the appearance of edema/myositis. No definite hematoma. Findings could be due to viral myositis, other post infectious myositis, muscle infarcts or drug related. Could not exclude compartment syndrome given the amount of swelling and edema. However, this is a clinical diagnosis. 2. No significant bony findings. Fracture, septic arthritis or osteomyelitis. Electronically Signed   By: Rudie Meyer M.D.   On: 09/28/2020 20:44   DG Hip Unilat W or Wo Pelvis 2-3 Views Left  Result Date: 09/28/2020 CLINICAL DATA:  Fall with left leg pain. EXAM: DG HIP (WITH OR WITHOUT PELVIS) 2-3V LEFT COMPARISON:  None. FINDINGS: There is mild left hip joint space narrowing and acetabular spurring. The femoral head is well seated. There is no evidence of fracture. The pubic rami are intact. Pubic symphysis and sacroiliac joints are congruent. No evidence of a vascular necrosis or focal bone abnormality. Vascular calcifications are seen. IMPRESSION: Mild left hip osteoarthritis. No acute fracture. Electronically Signed   By: Narda Rutherford M.D.   On: 09/28/2020 17:13    Microbiology: No results found for this or any previous visit (from the past 240 hour(s)).   Labs: CBC: No results for input(s): WBC, NEUTROABS, HGB, HCT, MCV, PLT in the last 168 hours. Basic Metabolic Panel: Recent Labs  Lab 10/19/20 0201 10/20/20 0203 10/21/20 0345 10/22/20 0149  NA 141 140 140 139  K 3.8 4.0 3.6 3.8  CL 104 101 101 102  CO2 28 27 27 26   GLUCOSE 96 90 88 85  BUN 40* 45* 42* 43*  CREATININE 5.89* 5.55* 4.88* 4.52*  CALCIUM 9.9 10.1 10.7* 10.4*  PHOS 6.7*  6.9* 6.2* 5.6*   Liver Function Tests: Recent Labs  Lab 10/19/20 0201 10/20/20 0203 10/21/20 0345 10/22/20 0149  ALBUMIN 2.4* 2.7* 2.7* 2.9*   CBG: No results for input(s): GLUCAP in the last 168 hours.  Time spent: 35 minutes  Signed:  12/22/20  [facility] 10/22/2020

## 2020-11-02 ENCOUNTER — Ambulatory Visit: Payer: Medicaid Other | Attending: Internal Medicine

## 2020-11-02 ENCOUNTER — Other Ambulatory Visit: Payer: Self-pay

## 2020-11-02 DIAGNOSIS — M6281 Muscle weakness (generalized): Secondary | ICD-10-CM | POA: Diagnosis present

## 2020-11-02 DIAGNOSIS — R2689 Other abnormalities of gait and mobility: Secondary | ICD-10-CM | POA: Insufficient documentation

## 2020-11-02 DIAGNOSIS — M79605 Pain in left leg: Secondary | ICD-10-CM | POA: Diagnosis not present

## 2020-11-02 NOTE — Therapy (Signed)
[facility] [address] [place], [place], [address] Phone: [phone]   Fax:  [phone]  Physical Therapy Evaluation  Patient Details  Name: Brett Butler MRN: [id number] Date of Birth: 25-Jan-1966 Referring Provider (PT): Rolly Salter, MD   Encounter Date: 11/02/2020   PT End of Session - 11/02/20 1334     Visit Number 1    Number of Visits 17    Date for PT Re-Evaluation 12/28/20    Authorization Type self pay    PT Start Time 0925   arrived late   PT Stop Time 1000    PT Time Calculation (min) 35 min    Activity Tolerance Patient tolerated treatment well    Behavior During Therapy [facility] for tasks assessed/performed             No past medical history on file.  Past Surgical History:  Procedure Laterality Date   ANKLE SURGERY     IR FLUORO GUIDE CV LINE RIGHT  09/30/2020   IR FLUORO GUIDE CV LINE RIGHT  10/07/2020   IR REMOVAL TUN CV CATH W/O FL  10/22/2020   IR US GUIDE VASC ACCESS RIGHT  09/30/2020   IR US GUIDE VASC ACCESS RIGHT  10/07/2020    There were no vitals filed for this visit.    Subjective Assessment - 11/02/20 0930     Subjective Pt presents to PT with sister s/p ARF and rhabdomyolysis on LLE w/ L sciatic nerve palsy after being found unconscious on 8/16 follow accidental overdose. He has discharged home from acute care setting to live with sister and his mother. Since discharge home he has had significant L LE pain and discomfort, along with continued decrease in L LE nerve firing and general difficulty with ambulation. He is frustrated by inability to ambulate community distances and continued pain in AFO and night boot.    Patient is accompained by: Family member   sister   Pertinent History pt w/ ARF and rhabdomyolysis on  LLE w/ L sciatic nerve palsy - [facility] admission on 8/16    Limitations Standing;Walking;Lifting    How long can you sit comfortably? 10 minutes    How long can  you stand comfortably? 5-10 minutes    How long can you walk comfortably? 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 Yes    Pain Score 7    10/10   Pain Location Leg    Pain Orientation Left    Pain Descriptors / Indicators Sharp;Shooting    Pain Type Acute pain;Chronic pain    Pain Frequency Constant    Aggravating Factors  walking, standing, prolonged                [facility] PT Assessment - 11/02/20 0001       Assessment   Medical Diagnosis Dx: Rhabdomyolysis    Referring Provider (PT) Rolly Salter, MD    Hand Dominance Right    Prior Therapy in acute care      Precautions   Precautions Fall    Required Braces or Orthoses Other Brace/Splint    Other Brace/Splint AFO on L foot      Restrictions   Weight Bearing Restrictions No      Balance Screen   Has the patient fallen in the past 6 months Yes  How many times? many near falls since returning home    Has the patient had a decrease in activity level because of a fear of falling?  No    Is the patient reluctant to leave their home because of a fear of falling?  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      Cognition   Overall Cognitive Status Within Functional Limits for tasks assessed    Attention Focused      Observation/Other Assessments   Focus on Therapeutic Outcomes (FOTO)  No FOTO    Other Surveys  Lower Extremity Functional Scale    Lower Extremity Functional Scale  12/80      Sensation   Light Touch Impaired  by gross assessment    Additional Comments decreased light touch L4-S1 dermatome      Strength   Right Hip Flexion 5/5    Right Hip ABduction 5/5    Right Hip ADduction 5/5    Left Hip Flexion 4/5    Left Hip ABduction 4/5    Left Hip ADduction 2+/5    Right Knee Flexion 5/5    Right Knee Extension 5/5    Left Knee Flexion 2+/5    Left Knee Extension 4/5      Transfers   Five time sit to stand comments  30 sec w/ UE support      Ambulation/Gait   Gait Comments ambulates with FWW using step-to pattern w/ L LE leading, AFO donned on L LE                        Objective measurements completed on examination: See above findings.                PT Education - 11/02/20 1341     Education Details HEP    Person(s) Educated Patient    Methods Explanation;Demonstration;Handout    Comprehension Verbalized understanding;Returned demonstration              PT Short Term Goals - 11/02/20 1335       PT SHORT TERM GOAL #1   Title Pt will be compliant and knowledgeable with initial HEP for improved carrovery    Baseline initial HEP given    Time 3    Period Weeks    Status New    Target Date 11/23/20               PT Long Term Goals - 11/02/20 1336       PT LONG TERM GOAL #1   Title Pt will improve LEFS score to at least 35/80 as proxy for functional improvement    Baseline 12/80 LEFS    Time 8    Period Weeks    Status New    Target Date 12/28/20      PT LONG TERM GOAL #2   Title Pt will decrease 5xSTS to no greater than 15 sec for improved balance and functional mobility    Baseline 30  sec with UE support    Time 8    Period Weeks    Status New    Target Date 12/28/20      PT LONG TERM GOAL #3   Title Pt will ambulate 866ft with least restrictive AD for improved balance and mobility    Baseline 127ft w/ FWW    Time 8    Period Weeks    Status New    Target Date 12/28/20      PT LONG TERM GOAL #4   Title Pt will self  report L LE pain no greater than 4/10 at worst in order to improve comfort and function    Baseline 10/10 at worst    Time 8    Period Weeks    Status New    Target Date 12/28/20                    Plan - 11/02/20 1358     Clinical Impression Statement Pt is a 55 y/o M who presents to PT s/p  ARF and rhabdomyolysis on LLE w/ possible L sciatic nerve injury. Physical findings are consistent with timeline, as pt demonstrates decreased L LE strength, general balance and gait deficits, and impaired functional mobility. His 5xSTS indicates significant fall risk and shows he is operating well below baseline. Likewise, his LEFS score indicates severe impairment in the functioning of home ADLs and higher level mobility. Pt would benefit from skilled PT services working on improving strength, balance, and mobility in order to increase safety and decrease pain.    Personal Factors and Comorbidities Comorbidity 1;Finances;Transportation    Comorbidities PMH: polysubstance abuse    Examination-Activity Limitations Squat;Stairs;Stand;Lift;Caring for Others;Transfers;Locomotion Level    Examination-Participation Restrictions Yard Work;Driving;Occupation;Community Activity;Volunteer    Stability/Clinical Decision Making Stable/Uncomplicated    Clinical Decision Making Low    Rehab Potential Good    PT Frequency 2x / week    PT Duration 8 weeks    PT Treatment/Interventions ADLs/Self Care Home Management;Electrical Stimulation;Traction;Cryotherapy;Ultrasound;Gait training;Stair training;Functional mobility training;Therapeutic activities;Therapeutic exercise;Balance training;Neuromuscular re-education;Patient/family education;Manual techniques;Vasopneumatic Device;Splinting;Taping    PT Next Visit Plan assess balance and gait; assess response to HEP, progress as able    PT Home Exercise Plan Access Code: [email or web address]    Consulted and Agree with Plan of Care Patient             Patient will  benefit from skilled therapeutic intervention in order to improve the following deficits and impairments:  Abnormal gait, Decreased activity tolerance, Decreased balance, Decreased endurance, Decreased mobility, Decreased range of motion, Decreased strength, Difficulty walking, Impaired sensation, Pain  Visit Diagnosis: Pain in left leg  Muscle weakness (generalized)  Other abnormalities of gait and mobility     Problem List Patient Active Problem List   Diagnosis Date Noted   Rhabdomyolysis 09/29/2020   Cocaine abuse (HCC) 09/29/2020   Hyperkalemia 09/29/2020   Leukocytosis 09/29/2020   Injury of left sciatic nerve    ARF (acute renal failure) (HCC) 09/28/2020   Schizoaffective disorder, depressive type (HCC) 09/10/2019   Opioid use disorder, moderate, dependence (HCC) 09/10/2019   Cannabis use disorder, moderate, dependence (HCC) 09/10/2019   PTSD (post-traumatic stress disorder) 09/10/2019    Eloy End, PT 11/02/2020, 2:05 PM  [facility] [address] [place], [place], [address] Phone: [phone]   Fax:  [phone]  Name: Kaeleb Emond MRN: [id number] Date of Birth: January 07, 1966

## 2020-11-09 ENCOUNTER — Ambulatory Visit: Payer: Medicaid Other

## 2020-11-09 ENCOUNTER — Telehealth: Payer: Self-pay

## 2020-11-09 NOTE — Telephone Encounter (Signed)
PT called and spoke with patient regarding missed visit. Patient noted that they are having transportation issues, as his sister had to lend their car to her son.   PT informed patient to call in advance if they knew they could not make appointment. Also reminded him of attendance policy.  Eloy End, PT, DPT 11/09/20 5:07 PM

## 2020-11-12 ENCOUNTER — Telehealth: Payer: Self-pay | Admitting: Physical Therapy

## 2020-11-12 ENCOUNTER — Ambulatory Visit: Payer: Medicaid Other | Admitting: Physical Therapy

## 2020-11-12 NOTE — Telephone Encounter (Signed)
Contacted patient regarding no show to appointment. He reports that he tried to call but did not get though. Reminded him of next appointment dates and discovered that he cannot come on Mondays due to transportation so those were removed from his schedule. He will continue with Thursdays going forward.

## 2020-11-15 ENCOUNTER — Ambulatory Visit: Payer: Medicaid Other

## 2020-11-18 ENCOUNTER — Other Ambulatory Visit: Payer: Self-pay

## 2020-11-18 ENCOUNTER — Emergency Department (HOSPITAL_COMMUNITY)
Admission: EM | Admit: 2020-11-18 | Discharge: 2020-11-18 | Disposition: A | Discharge status: Home / Self Care | Payer: Medicaid Other | Attending: Emergency Medicine | Admitting: Emergency Medicine

## 2020-11-18 ENCOUNTER — Emergency Department (HOSPITAL_BASED_OUTPATIENT_CLINIC_OR_DEPARTMENT_OTHER): Payer: Medicaid Other

## 2020-11-18 ENCOUNTER — Encounter (HOSPITAL_COMMUNITY): Payer: Self-pay

## 2020-11-18 ENCOUNTER — Ambulatory Visit: Payer: Medicaid Other | Admitting: Physical Therapy

## 2020-11-18 DIAGNOSIS — M79672 Pain in left foot: Secondary | ICD-10-CM | POA: Diagnosis present

## 2020-11-18 DIAGNOSIS — F17210 Nicotine dependence, cigarettes, uncomplicated: Secondary | ICD-10-CM | POA: Diagnosis not present

## 2020-11-18 DIAGNOSIS — G629 Polyneuropathy, unspecified: Secondary | ICD-10-CM | POA: Diagnosis not present

## 2020-11-18 DIAGNOSIS — M79605 Pain in left leg: Secondary | ICD-10-CM

## 2020-11-18 LAB — CBC WITH DIFFERENTIAL/PLATELET
Abs Immature Granulocytes: 0.02 10*3/uL (ref 0.00–0.07)
Basophils Absolute: 0.1 10*3/uL (ref 0.0–0.1)
Basophils Relative: 1 %
Eosinophils Absolute: 0.4 10*3/uL (ref 0.0–0.5)
Eosinophils Relative: 5 %
HCT: 30 % — ABNORMAL LOW (ref 39.0–52.0)
Hemoglobin: 9.8 g/dL — ABNORMAL LOW (ref 13.0–17.0)
Immature Granulocytes: 0 %
Lymphocytes Relative: 35 %
Lymphs Abs: 2.6 10*3/uL (ref 0.7–4.0)
MCH: 30 pg (ref 26.0–34.0)
MCHC: 32.7 g/dL (ref 30.0–36.0)
MCV: 91.7 fL (ref 80.0–100.0)
Monocytes Absolute: 0.5 10*3/uL (ref 0.1–1.0)
Monocytes Relative: 7 %
Neutro Abs: 4 10*3/uL (ref 1.7–7.7)
Neutrophils Relative %: 52 %
Platelets: 343 10*3/uL (ref 150–400)
RBC: 3.27 MIL/uL — ABNORMAL LOW (ref 4.22–5.81)
RDW: 14.7 % (ref 11.5–15.5)
WBC: 7.6 10*3/uL (ref 4.0–10.5)
nRBC: 0 % (ref 0.0–0.2)

## 2020-11-18 LAB — BASIC METABOLIC PANEL
Anion gap: 9 (ref 5–15)
BUN: 8 mg/dL (ref 6–20)
CO2: 23 mmol/L (ref 22–32)
Calcium: 9.2 mg/dL (ref 8.9–10.3)
Chloride: 106 mmol/L (ref 98–111)
Creatinine, Ser: 1.07 mg/dL (ref 0.61–1.24)
GFR, Estimated: >60 mL/min (ref >60)
Glucose, Bld: 99 mg/dL (ref 70–99)
Potassium: 3.9 mmol/L (ref 3.5–5.1)
Sodium: 138 mmol/L (ref 135–145)

## 2020-11-18 LAB — CK: Total CK: 173 U/L (ref 49–397)

## 2020-11-18 MED ORDER — GABAPENTIN 100 MG PO CAPS: 200.0000 mg | ORAL_CAPSULE | Freq: Every day | ORAL | 0 refills | Status: DC

## 2020-11-18 NOTE — ED Triage Notes (Signed)
Pt reports worsening left leg pain, out of his gabapentin, now having right leg pain and numbness. Pt ambulatory with walker.

## 2020-11-18 NOTE — ED Provider Notes (Signed)
[facility] EMERGENCY DEPARTMENT Provider Note   CSN: [id number] Arrival date & time: 11/18/20  1045     History Chief Complaint  Patient presents with   Leg Pain    Alfons Sulkowski is a 55 y.o. male presents with left leg foot pain.  States that his symptoms began gradually about a week ago after he ran out of his prescribed gabapentin.  He describes his pain as remittent burning. No aggravating or alleviating factors.  He states that he felt as though his left foot was going to give out on him and he almost fell the other day.  He ambulates with a walker at baseline.  No trauma to the area, no recent falls.  He intermittently has pain in his left calf and left thigh and describes them as soreness.  No hip pain or back pain.  Also reports 1 episode of right foot numbness, this has gone away.  Upon chart review patient was admitted for acute renal failure following nontraumatic rhabdomyolysis on 9/9, had been prescribed gabapentin in the setting of sciatic nerve damage.   Leg Pain Associated symptoms: no back pain and no fever       History reviewed. No pertinent past medical history.  Patient Active Problem List   Diagnosis Date Noted   Rhabdomyolysis 09/29/2020   Cocaine abuse (HCC) 09/29/2020   Hyperkalemia 09/29/2020   Leukocytosis 09/29/2020   Injury of left sciatic nerve    ARF (acute renal failure) (HCC) 09/28/2020   Schizoaffective disorder, depressive type (HCC) 09/10/2019   Opioid use disorder, moderate, dependence (HCC) 09/10/2019   Cannabis use disorder, moderate, dependence (HCC) 09/10/2019   PTSD (post-traumatic stress disorder) 09/10/2019    Past Surgical History:  Procedure Laterality Date   ANKLE SURGERY     IR FLUORO GUIDE CV LINE RIGHT  09/30/2020   IR FLUORO GUIDE CV LINE RIGHT  10/07/2020   IR REMOVAL TUN CV CATH W/O FL  10/22/2020   IR US GUIDE VASC ACCESS RIGHT  09/30/2020   IR US GUIDE VASC ACCESS RIGHT  10/07/2020       No family  history on file.  Social History   Tobacco Use   Smoking status: Every Day    Types: Cigarettes   Smokeless tobacco: Never  Substance Use Topics   Alcohol use: Yes    Alcohol/week: 10.0 standard drinks    Types: 10 Cans of beer per week   Drug use: Yes    Types: Cocaine    Home Medications Prior to Admission medications   Medication Sig Start Date End Date Taking? Authorizing Provider  gabapentin (NEURONTIN) 100 MG capsule Take 2 capsules (200 mg total) by mouth daily. 11/18/20 12/18/20 Yes Jaedah Lords T, PA-C  docusate sodium (COLACE) 100 MG capsule Take 1 capsule (100 mg total) by mouth 2 (two) times daily as needed for mild constipation. 10/22/20   Rolly Salter, MD  metoprolol tartrate (LOPRESSOR) 25 MG tablet Take 1 tablet (25 mg total) by mouth 2 (two) times daily. 10/22/20   Rolly Salter, MD  sevelamer carbonate (RENVELA) 800 MG tablet Take 1 tablet (800 mg total) by mouth 3 (three) times daily with meals. 10/22/20   Rolly Salter, MD    Allergies    Other  Review of Systems   Review of Systems  Constitutional:  Negative for chills and fever.  Respiratory:  Negative for shortness of breath.   Cardiovascular:  Negative for chest pain and leg swelling.  Musculoskeletal:  Negative for back pain.       Left foot, calf, and thigh pain  Neurological:  Positive for numbness.  All other systems reviewed and are negative.  Physical Exam Updated Vital Signs BP 116/74   Pulse 83   Temp 98.8 F (37.1 C)   Resp 20   Ht 5\' 8"  (1.727 m)   Wt 54.4 kg   SpO2 100%   BMI 18.25 kg/m   Physical Exam Vitals and nursing note reviewed.  Constitutional:      Appearance: Normal appearance.  HENT:     Head: Normocephalic and atraumatic.  Eyes:     Conjunctiva/sclera: Conjunctivae normal.  Pulmonary:     Effort: Pulmonary effort is normal. No respiratory distress.  Musculoskeletal:     Comments: Diffuse numbness to posterior aspect of the left knee, and left calf.  Full  passive ROM of bilateral feet, ankles, knees, and hips. 5/5 strength in BLE. Sensation in tact. Pulses equal and normal in BLE.   Skin:    General: Skin is warm and dry.  Neurological:     Mental Status: He is alert.  Psychiatric:        Mood and Affect: Mood normal.        Behavior: Behavior normal.    ED Results / Procedures / Treatments   Labs (all labs ordered are listed, but only abnormal results are displayed) Labs Reviewed  CBC WITH DIFFERENTIAL/PLATELET - Abnormal; Notable for the following components:      Result Value   RBC 3.27 (*)    Hemoglobin 9.8 (*)    HCT 30.0 (*)    All other components within normal limits  BASIC METABOLIC PANEL  CK    EKG None  Radiology VAS LOWER EXTREMITY VENOUS (DVT) (ONLY [facility] & [facility])  Result Date: 11/18/2020  Lower Venous DVT Study Patient Name:  MAHIN GUARDIA  Date of Exam:   11/18/2020 Medical Rec #: [id number]       Accession #:    [id number] Date of Birth: 1965-05-13      Patient Gender: M Patient Age:   89 years Exam Location:  [facility] Procedure:      VAS [facility] LOWER EXTREMITY VENOUS (DVT) Referring Phys: BRITNI HENDERLY --------------------------------------------------------------------------------  Indications: Pain.  Comparison Study: no prior Performing Technologist: [place] RVS  Examination Guidelines: A complete evaluation includes B-mode imaging, spectral Doppler, color Doppler, and power Doppler as needed of all accessible portions of each vessel. Bilateral testing is considered an integral part of a complete examination. Limited examinations for reoccurring indications may be performed as noted. The reflux portion of the exam is performed with the patient in reverse Trendelenburg.  +-----+---------------+---------+-----------+----------+--------------+ RIGHTCompressibilityPhasicitySpontaneityPropertiesThrombus Aging +-----+---------------+---------+-----------+----------+--------------+ CFV  Full            Yes      Yes                                 +-----+---------------+---------+-----------+----------+--------------+   +---------+---------------+---------+-----------+----------+--------------+ LEFT     CompressibilityPhasicitySpontaneityPropertiesThrombus Aging +---------+---------------+---------+-----------+----------+--------------+ CFV      Full           Yes      Yes                                 +---------+---------------+---------+-----------+----------+--------------+ SFJ      Full                                                        +---------+---------------+---------+-----------+----------+--------------+  FV Prox  Full                                                        +---------+---------------+---------+-----------+----------+--------------+ FV Mid   Full                                                        +---------+---------------+---------+-----------+----------+--------------+ FV DistalFull                                                        +---------+---------------+---------+-----------+----------+--------------+ PFV      Full                                                        +---------+---------------+---------+-----------+----------+--------------+ POP      Full           Yes      Yes                                 +---------+---------------+---------+-----------+----------+--------------+ PTV      Full                                                        +---------+---------------+---------+-----------+----------+--------------+ PERO     Full                                                        +---------+---------------+---------+-----------+----------+--------------+     Summary: RIGHT: - No evidence of common femoral vein obstruction.  LEFT: - There is no evidence of deep vein thrombosis in the lower extremity.  - No cystic structure found in the popliteal fossa.  *See table(s) above for  measurements and observations. Electronically signed by Heath Lark on 11/18/2020 at 2:28:01 PM.    Final     Procedures Procedures   Medications Ordered in ED Medications - No data to display  ED Course  I have reviewed the triage vital signs and the nursing notes.  Pertinent labs & imaging results that were available during my care of the patient were reviewed by me and considered in my medical decision making (see chart for details).    MDM Rules/Calculators/A&P                           Patient is 55 year old male who presents with left foot and leg pain after running out of his gabapentin last week.  He also had one episode of right foot numbness.  He describes his pain is a burning sensation that is intermittent.  No aggravating or alleviating factors, but often notes that the pain is worse when he is trying to sleep at night.  No injuries or falls.  He is ambulatory with a walker.  Upon chart review patient was admitted for acute renal failure following nontraumatic rhabdomyolysis on 9/9, had been prescribed gabapentin in the setting of sciatic nerve damage.  On exam patient has full passive ROM of bilateral feet, ankles, knees, and hips. He is neurovascularly intact in the BLE. Lab work today unremarkable. Hemoglobin of 9.8 that appears improved compared to prior. Vascular ultrasound showed no DVT in BLE. No concern for septic joint on exam.  Due to no acute injury not considering admission of the joint at this time.  Symptoms consistent with neuropathy following running out of prescribed gabapentin.  He is not requiring admission or inpatient treatment for symptoms at this time.  Plan to discharge home with prescription for gabapentin and follow-up with his primary care provider.  Discussed reasons to return to the emergency department.  Patient agreeable to plan.  Final Clinical Impression(s) / ED Diagnoses Final diagnoses:  Left foot pain  Neuropathy    Rx / DC Orders ED  Discharge Orders          Ordered    gabapentin (NEURONTIN) 100 MG capsule  Daily        11/18/20 1836             Neyah Ellerman T, PA-C 11/18/20 2353    Terald Sleeper, MD 11/19/20 1122

## 2020-11-18 NOTE — ED Provider Notes (Signed)
Emergency Medicine Provider Triage Evaluation Note  Brett Butler , a 55 y.o. male  was evaluated in triage.  Pt complains of leg pain.  Located primarily to left leg however also to right foot.  Denies his gabapentin.  States this pain feels different than his typical pain.  He has pain to the posterior aspect of his knee as well as his calf.  Patient thinks he has a history of blood clots however states he is unsure.  He is not currently on any anticoagulation.  Is got some pain to his left buttocks.  No recent traumatic injury since prior discharge shortness of history of rhabdomyolysis.  Feels like his right foot is intermittently tingling however no pain to the leg.  He has no back pain, saddle paresthesias, bowel or bladder incontinence.  Review of Systems  Positive: Left leg pain, swelling, right foot numbness Negative: Fever, chills, emesis, chest pain, shortness of breath  Physical Exam  BP 115/79 (BP Location: Left Arm)   Pulse 98   Temp 98.1 F (36.7 C)   Resp 18   Ht 5\' 8"  (1.727 m)   Wt 54.4 kg   SpO2 100%   BMI 18.25 kg/m  Gen:   Awake, no distress   Resp:  Normal effort  MSK:   Moves extremities without difficulty, right leg with brace on.  Diffuse tenderness to posterior aspect left knee, left calf.  Able to plantarflex dorsiflex bilateral ankles.  No midline CTL tenderness. Other:    Medical Decision Making  Medically screening exam initiated at 12:10 PM.  Appropriate orders placed.  Brett Butler was informed that the remainder of the evaluation will be completed by another provider, this initial triage assessment does not replace that evaluation, and the importance of remaining in the ED until their evaluation is complete.  Leg pain    Brett Butler A, PA-C 11/18/20 1219    1220, MD 11/20/20 2025

## 2020-11-18 NOTE — Discharge Instructions (Addendum)
You were seen in the emergency department today for left leg/foot pain and right foot numbness.   Your lab work today was reassuring. We did an ultrasound of your legs which showed no blood clot.  Based on my exam and the description of her pain it sounds likely that your symptoms are as a result of no longer taking the gabapentin.  I am refilling your prescription of gabapentin and I would like you to follow up with your primary doctor to manage these symptoms in the long run.   Continue to monitor how you're doing and return to the ER for new or worsening symptoms such as new pain, numbness or difficulties walking. It has been a pleasure seeing and caring for you today and I hope you start feeling better soon.

## 2020-11-18 NOTE — Progress Notes (Signed)
Lower extremity venous has been completed.   Preliminary results in CV Proc.   Brett Butler 11/18/2020 1:35 PM

## 2020-12-02 ENCOUNTER — Other Ambulatory Visit: Payer: Self-pay

## 2020-12-02 ENCOUNTER — Ambulatory Visit: Payer: Medicaid Other | Attending: Internal Medicine

## 2020-12-02 DIAGNOSIS — M79605 Pain in left leg: Secondary | ICD-10-CM | POA: Insufficient documentation

## 2020-12-02 DIAGNOSIS — R2689 Other abnormalities of gait and mobility: Secondary | ICD-10-CM | POA: Diagnosis present

## 2020-12-02 DIAGNOSIS — M6281 Muscle weakness (generalized): Secondary | ICD-10-CM | POA: Diagnosis present

## 2020-12-02 NOTE — Therapy (Signed)
[facility] [address] [place], [place], [address] Phone: [phone]   Fax:  [phone]  Physical Therapy Treatment  Patient Details  Name: Brett Butler MRN: [id number] Date of Birth: 10/04/1965 Referring Provider (PT): Rolly Salter, MD   Encounter Date: 12/02/2020   PT End of Session - 12/02/20 1824     Visit Number 2    Number of Visits 17    Date for PT Re-Evaluation 12/28/20    Authorization Type self pay    PT Start Time 1825    PT Stop Time 1905    PT Time Calculation (min) 40 min    Activity Tolerance Patient tolerated treatment well    Behavior During Therapy [facility] for tasks assessed/performed             No past medical history on file.  Past Surgical History:  Procedure Laterality Date   ANKLE SURGERY     IR FLUORO GUIDE CV LINE RIGHT  09/30/2020   IR FLUORO GUIDE CV LINE RIGHT  10/07/2020   IR REMOVAL TUN CV CATH W/O FL  10/22/2020   IR US GUIDE VASC ACCESS RIGHT  09/30/2020   IR US GUIDE VASC ACCESS RIGHT  10/07/2020    There were no vitals filed for this visit.   Subjective Assessment - 12/02/20 1825     Subjective Pt presents to PT with continued reports of L LE severe pain and discomfort. Does note that the more proximal pain is a little better. Lost his initial HEP and would like to have this printed off again. He is ready to begin PT at this time.    Currently in Pain? Yes    Pain Score 10-Worst pain ever    Pain Location Leg    Pain Orientation Left           [facility] Adult PT Treatment/Exercise:   Therapeutic Exercise:  Ankle pumps x 15 Supine SLR 2x10 L LE Supine L heel slide 2x10 L calf stretch w/ strap 2x30 sec Seated LAQ 2x10 L STS no UE support 3x5 raised table                               PT Short Term Goals - 11/02/20 1335       PT SHORT TERM GOAL #1   Title Pt will be compliant and knowledgeable with initial HEP for improved carrovery     Baseline initial HEP given    Time 3    Period Weeks    Status New    Target Date 11/23/20               PT Long Term Goals - 11/02/20 1336       PT LONG TERM GOAL #1   Title Pt will improve LEFS score to at least 35/80 as proxy for functional improvement    Baseline 12/80 LEFS    Time 8    Period Weeks    Status New    Target Date 12/28/20      PT LONG TERM GOAL #2   Title Pt will decrease 5xSTS to no greater than 15 sec for improved balance and functional mobility    Baseline 30 sec with UE support    Time 8    Period Weeks    Status New    Target Date 12/28/20      PT LONG TERM GOAL #3   Title  Pt will ambulate 811ft with least restrictive AD for improved balance and mobility    Baseline 145ft w/ FWW    Time 8    Period Weeks    Status New    Target Date 12/28/20      PT LONG TERM GOAL #4   Title Pt will self report L LE pain no greater than 4/10 at worst in order to improve comfort and function    Baseline 10/10 at worst    Time 8    Period Weeks    Status New    Target Date 12/28/20                   Plan - 12/02/20 1830     Clinical Impression Statement Pt tolerated treatment fair, but had continued pain and discomfort in L LE. Today's session focus on improving L LE strength and improving muscle length of L calf to reduce further deficits. He continues to benefit from skilled PT working to improve strength, stability, and gait. PT will continue to progress as tolerated per POC.    PT Treatment/Interventions ADLs/Self Care Home Management;Electrical Stimulation;Traction;Cryotherapy;Ultrasound;Gait training;Stair training;Functional mobility training;Therapeutic activities;Therapeutic exercise;Balance training;Neuromuscular re-education;Patient/family education;Manual techniques;Vasopneumatic Device;Splinting;Taping    PT Next Visit Plan assess balance and gait; assess response to HEP, progress as able    PT Home Exercise Plan Access Code: [email or web address]              Patient will benefit from skilled therapeutic intervention in order to improve the following deficits and impairments:  Abnormal gait, Decreased activity tolerance, Decreased balance, Decreased endurance, Decreased mobility, Decreased range of motion, Decreased strength, Difficulty walking, Impaired sensation, Pain  Visit Diagnosis: Pain in left leg  Muscle weakness (generalized)  Other abnormalities of gait and mobility     Problem List Patient Active Problem List   Diagnosis Date Noted   Rhabdomyolysis 09/29/2020   Cocaine abuse (HCC) 09/29/2020   Hyperkalemia 09/29/2020   Leukocytosis 09/29/2020   Injury of left sciatic nerve    ARF (acute renal failure) (HCC) 09/28/2020   Schizoaffective disorder, depressive type (HCC) 09/10/2019   Opioid use disorder, moderate, dependence (HCC) 09/10/2019   Cannabis use disorder, moderate, dependence (HCC) 09/10/2019   PTSD (post-traumatic stress disorder) 09/10/2019    Eloy End, PT 12/03/2020, 10:24 AM  [facility] Rehabilitation [facility] [address] [place], [place], [address] Phone: [phone]   Fax:  [phone]  Name: Brett Butler MRN: [id number] Date of Birth: 1965-06-15

## 2020-12-09 ENCOUNTER — Ambulatory Visit: Payer: Medicaid Other

## 2020-12-09 ENCOUNTER — Other Ambulatory Visit: Payer: Self-pay

## 2020-12-09 DIAGNOSIS — M79605 Pain in left leg: Secondary | ICD-10-CM | POA: Diagnosis not present

## 2020-12-09 DIAGNOSIS — M6281 Muscle weakness (generalized): Secondary | ICD-10-CM

## 2020-12-09 DIAGNOSIS — R2689 Other abnormalities of gait and mobility: Secondary | ICD-10-CM

## 2020-12-09 NOTE — Therapy (Addendum)
[facility] Outpatient Rehabilitation [facility] [address] [place], [place], [address] Phone: [phone]   Fax:  [phone]  Physical Therapy Treatment/Discharge  Patient Details  Name: Brett Butler MRN: [id number] Date of Birth: 04-Dec-1965 Referring Provider (PT): Rolly Salter, MD   Encounter Date: 12/09/2020     No past medical history on file.  Past Surgical History:  Procedure Laterality Date   ANKLE SURGERY     IR FLUORO GUIDE CV LINE RIGHT  09/30/2020   IR FLUORO GUIDE CV LINE RIGHT  10/07/2020   IR REMOVAL TUN CV CATH W/O FL  10/22/2020   IR US GUIDE VASC ACCESS RIGHT  09/30/2020   IR US GUIDE VASC ACCESS RIGHT  10/07/2020    There were no vitals filed for this visit.   Subjective Assessment - 12/11/20 1500     Subjective Pt presents to PT with reports of continued L LE pain and discomfort. He states he has note been wholly compliant with HEP. Pt is ready to begin PT at this time.    Currently in Pain? Yes    Pain Score 10-Worst pain ever    Pain Location Foot    Pain Orientation Left            [facility] Adult PT Treatment/Exercise:   Therapeutic Exercise:  Gait rolled into therex - amb 179ft post manual Supine SLR 2x10 L LE Supine L heel slide 2x10 L calf stretch w/ strap 2x30 sec Seated LAQ 2x10 L STS no UE support 3x5 raised table  Manual Therapy: Deep pressure STM to L calf                               PT Short Term Goals - 11/02/20 1335       PT SHORT TERM GOAL #1   Title Pt will be compliant and knowledgeable with initial HEP for improved carrovery    Baseline initial HEP given    Time 3    Period Weeks    Status New    Target Date 11/23/20               PT Long Term Goals - 11/02/20 1336       PT LONG TERM GOAL #1   Title Pt will improve LEFS score to at least 35/80 as proxy for functional improvement    Baseline 12/80 LEFS    Time 8    Period Weeks    Status New    Target  Date 12/28/20      PT LONG TERM GOAL #2   Title Pt will decrease 5xSTS to no greater than 15 sec for improved balance and functional mobility    Baseline 30 sec with UE support    Time 8    Period Weeks    Status New    Target Date 12/28/20      PT LONG TERM GOAL #3   Title Pt will ambulate 823ft with least restrictive AD for improved balance and mobility    Baseline 183ft w/ FWW    Time 8    Period Weeks    Status New    Target Date 12/28/20      PT LONG TERM GOAL #4   Title Pt will self report L LE pain no greater than 4/10 at worst in order to improve comfort and function    Baseline 10/10 at worst    Time 8  Period Weeks    Status New    Target Date 12/28/20                   Plan - 12/11/20 1500     Clinical Impression Statement Pt tolerated treatment fair, able to complete prescribed exercises with no increase in pain. He responded well to deep pressure manual to L calf following gate control pain modulation. He continues to benefit from skilled PT and will be progressed as able.    PT Treatment/Interventions ADLs/Self Care Home Management;Electrical Stimulation;Traction;Cryotherapy;Ultrasound;Gait training;Stair training;Functional mobility training;Therapeutic activities;Therapeutic exercise;Balance training;Neuromuscular re-education;Patient/family education;Manual techniques;Vasopneumatic Device;Splinting;Taping    PT Next Visit Plan assess balance and gait; assess response to HEP, progress as able    PT Home Exercise Plan Access Code: [email or web address]             Patient will benefit from skilled therapeutic intervention in order to improve the following deficits and impairments:  Abnormal gait, Decreased activity tolerance, Decreased balance, Decreased endurance, Decreased mobility, Decreased range of motion, Decreased strength, Difficulty walking, Impaired sensation, Pain  Visit Diagnosis: Pain in left leg  Muscle weakness (generalized)  Other  abnormalities of gait and mobility     Problem List Patient Active Problem List   Diagnosis Date Noted   Rhabdomyolysis 09/29/2020   Cocaine abuse (HCC) 09/29/2020   Hyperkalemia 09/29/2020   Leukocytosis 09/29/2020   Injury of left sciatic nerve    ARF (acute renal failure) (HCC) 09/28/2020   Schizoaffective disorder, depressive type (HCC) 09/10/2019   Opioid use disorder, moderate, dependence (HCC) 09/10/2019   Cannabis use disorder, moderate, dependence (HCC) 09/10/2019   PTSD (post-traumatic stress disorder) 09/10/2019    Eloy End, PT 12/11/2020, 3:01 PM  [facility] Outpatient Rehabilitation [facility] [address] [place], [place], [address] Phone: [phone]   Fax:  [phone]  Name: Brett Butler MRN: [id number] Date of Birth: [birth year]   PHYSICAL THERAPY DISCHARGE SUMMARY  Visits from Start of Care: 3  Current functional level related to goals / functional outcomes: Unable to assess   Remaining deficits: Unable to assess   Education / Equipment: N/A   Patient agrees to discharge. Patient goals were  N/A . Patient is being discharged due to not returning since the last visit.

## 2021-02-10 ENCOUNTER — Encounter: Payer: Self-pay | Admitting: Emergency Medicine

## 2021-02-10 ENCOUNTER — Encounter (HOSPITAL_BASED_OUTPATIENT_CLINIC_OR_DEPARTMENT_OTHER): Payer: Self-pay

## 2021-02-10 ENCOUNTER — Other Ambulatory Visit: Payer: Self-pay

## 2021-02-10 ENCOUNTER — Emergency Department (HOSPITAL_BASED_OUTPATIENT_CLINIC_OR_DEPARTMENT_OTHER)
Admission: EM | Admit: 2021-02-10 | Discharge: 2021-02-10 | Disposition: A | Discharge status: Home / Self Care | Payer: Medicaid Other | Attending: Emergency Medicine | Admitting: Emergency Medicine

## 2021-02-10 ENCOUNTER — Ambulatory Visit
Admission: EM | Admit: 2021-02-10 | Discharge: 2021-02-10 | Disposition: A | Discharge status: Home / Self Care | Payer: Medicaid Other

## 2021-02-10 DIAGNOSIS — M79672 Pain in left foot: Secondary | ICD-10-CM | POA: Diagnosis present

## 2021-02-10 DIAGNOSIS — M79605 Pain in left leg: Secondary | ICD-10-CM

## 2021-02-10 DIAGNOSIS — F17210 Nicotine dependence, cigarettes, uncomplicated: Secondary | ICD-10-CM | POA: Insufficient documentation

## 2021-02-10 LAB — BASIC METABOLIC PANEL
Anion gap: 7 (ref 5–15)
BUN: 18 mg/dL (ref 6–20)
CO2: 23 mmol/L (ref 22–32)
Calcium: 9.9 mg/dL (ref 8.9–10.3)
Chloride: 106 mmol/L (ref 98–111)
Creatinine, Ser: 0.89 mg/dL (ref 0.61–1.24)
GFR, Estimated: >60 mL/min (ref >60)
Glucose, Bld: 87 mg/dL (ref 70–99)
Potassium: 3.9 mmol/L (ref 3.5–5.1)
Sodium: 136 mmol/L (ref 135–145)

## 2021-02-10 LAB — HEPATIC FUNCTION PANEL
ALT: 11 U/L (ref 0–44)
AST: 15 U/L (ref 15–41)
Albumin: 3.9 g/dL (ref 3.5–5.0)
Alkaline Phosphatase: 38 U/L (ref 38–126)
Bilirubin, Direct: <0.1 mg/dL (ref 0.0–0.2)
Total Bilirubin: 0.3 mg/dL (ref 0.3–1.2)
Total Protein: 7.3 g/dL (ref 6.5–8.1)

## 2021-02-10 LAB — CBC WITH DIFFERENTIAL/PLATELET
Abs Immature Granulocytes: 0.03 10*3/uL (ref 0.00–0.07)
Basophils Absolute: 0.1 10*3/uL (ref 0.0–0.1)
Basophils Relative: 1 %
Eosinophils Absolute: 0.4 10*3/uL (ref 0.0–0.5)
Eosinophils Relative: 6 %
HCT: 37.6 % — ABNORMAL LOW (ref 39.0–52.0)
Hemoglobin: 12.7 g/dL — ABNORMAL LOW (ref 13.0–17.0)
Immature Granulocytes: 1 %
Lymphocytes Relative: 48 %
Lymphs Abs: 2.8 10*3/uL (ref 0.7–4.0)
MCH: 30.2 pg (ref 26.0–34.0)
MCHC: 33.8 g/dL (ref 30.0–36.0)
MCV: 89.3 fL (ref 80.0–100.0)
Monocytes Absolute: 0.4 10*3/uL (ref 0.1–1.0)
Monocytes Relative: 7 %
Neutro Abs: 2.2 10*3/uL (ref 1.7–7.7)
Neutrophils Relative %: 37 %
Platelets: 355 10*3/uL (ref 150–400)
RBC: 4.21 MIL/uL — ABNORMAL LOW (ref 4.22–5.81)
RDW: 13.8 % (ref 11.5–15.5)
WBC: 5.9 10*3/uL (ref 4.0–10.5)
nRBC: 0 % (ref 0.0–0.2)

## 2021-02-10 LAB — CK: Total CK: 76 U/L (ref 49–397)

## 2021-02-10 MED ORDER — GABAPENTIN 100 MG PO CAPS: 200.0000 mg | ORAL_CAPSULE | Freq: Every day | ORAL | 0 refills | Status: DC

## 2021-02-10 NOTE — Discharge Instructions (Addendum)
You came to the emergency department today to be evaluated for your left foot pain and numbness.  Your lab work and physical exam today were reassuring.  I have restarted you on the medication gabapentin.  Please keep to continue to take this medication as prescribed.  Please follow-up with your primary care provider for further management of this medication as well as to discuss possibly obtaining a ankle brachial index evaluation in the outpatient setting.  Get help right away if: You develop new or worsening foot numbness/tingling Your foot or toes are swollen. Your foot or toes turn white or blue. You have warmth and redness along your foot.

## 2021-02-10 NOTE — ED Triage Notes (Signed)
Patient states his left foot is painful/numb since he left the hospital in September. Was discharged with brace to help foot drop on that side. States it is getting worse

## 2021-02-10 NOTE — ED Provider Notes (Signed)
[facility] EMERGENCY DEPARTMENT Provider Note   CSN: [id number] Arrival date & time: 02/10/21  1040     History Chief Complaint  Patient presents with   Foot Pain    Brett Butler is a 55 y.o. male  With a history of schizoaffective disorder, polysubstance use, left leg weakness/neuropathic pain likely due to sciatic nerve damage, paroxysmal atrial fibrillation (not on anticoagulation), history of nontraumatic rhabdomyolysis (09/2020).  Presents to the emergency department with a chief complaint of numbness and pain to left foot.  Patient reports that he has been dealing with these issues since hospitalization in August 2022 for nontraumatic rhabdomyolysis.  Patient reports that numbness normally comes when standing and ambulating.  Over the last 2 weeks patient has also been having numbness intermittently at rest.  Patient states that worsening of numbness and pain started after he ran out of his gabapentin medication.  Was previously being prescribed by PCP from [facility] however has not been able to have follow-up appointment scheduled since running out of his medication.  Patient denies any recent falls or injuries.  Denies any change in weakness to left foot.  Denies any color change, rash, all wounds, fever, chills, facial asymmetry, dysarthria, visual disturbance.  Foot Pain Pertinent negatives include no chest pain, no abdominal pain, no headaches and no shortness of breath.      History reviewed. No pertinent past medical history.  Patient Active Problem List   Diagnosis Date Noted   Rhabdomyolysis 09/29/2020   Cocaine abuse (HCC) 09/29/2020   Hyperkalemia 09/29/2020   Leukocytosis 09/29/2020   Injury of left sciatic nerve    ARF (acute renal failure) (HCC) 09/28/2020   Schizoaffective disorder, depressive type (HCC) 09/10/2019   Opioid use disorder, moderate, dependence (HCC) 09/10/2019   Cannabis use disorder, moderate, dependence (HCC)  09/10/2019   PTSD (post-traumatic stress disorder) 09/10/2019    Past Surgical History:  Procedure Laterality Date   ANKLE SURGERY     IR FLUORO GUIDE CV LINE RIGHT  09/30/2020   IR FLUORO GUIDE CV LINE RIGHT  10/07/2020   IR REMOVAL TUN CV CATH W/O FL  10/22/2020   IR US GUIDE VASC ACCESS RIGHT  09/30/2020   IR US GUIDE VASC ACCESS RIGHT  10/07/2020       History reviewed. No pertinent family history.  Social History   Tobacco Use   Smoking status: Every Day    Types: Cigarettes   Smokeless tobacco: Never  Substance Use Topics   Alcohol use: Yes    Alcohol/week: 10.0 standard drinks    Types: 10 Cans of beer per week   Drug use: Yes    Types: Cocaine    Home Medications Prior to Admission medications   Medication Sig Start Date End Date Taking? Authorizing Provider  docusate sodium (COLACE) 100 MG capsule Take 1 capsule (100 mg total) by mouth 2 (two) times daily as needed for mild constipation. 10/22/20   Rolly Salter, MD  gabapentin (NEURONTIN) 100 MG capsule Take 2 capsules (200 mg total) by mouth daily. 11/18/20 12/18/20  Roemhildt, Lorin T, PA-C  metoprolol tartrate (LOPRESSOR) 25 MG tablet Take 1 tablet (25 mg total) by mouth 2 (two) times daily. 10/22/20   Rolly Salter, MD  sevelamer carbonate (RENVELA) 800 MG tablet Take 1 tablet (800 mg total) by mouth 3 (three) times daily with meals. 10/22/20   Rolly Salter, MD    Allergies    Other  Review of Systems  Review of Systems  Constitutional:  Negative for chills and fever.  HENT:  Negative for facial swelling.   Eyes:  Negative for visual disturbance.  Respiratory:  Negative for shortness of breath.   Cardiovascular:  Negative for chest pain.  Gastrointestinal:  Negative for abdominal pain, nausea and vomiting.  Genitourinary:  Negative for difficulty urinating and dysuria.  Musculoskeletal:  Positive for myalgias. Negative for back pain and neck pain.  Skin:  Negative for color change, pallor, rash and wound.   Neurological:  Positive for weakness and numbness. Negative for dizziness, syncope, light-headedness and headaches.  Psychiatric/Behavioral:  Negative for confusion.    Physical Exam Updated Vital Signs BP (!) 128/93 (BP Location: Right Arm)    Pulse 93    Temp 98.2 F (36.8 C) (Oral)    Resp 16    Ht 5\' 8"  (1.727 m)    Wt 58.5 kg    SpO2 100%    BMI 19.61 kg/m   Physical Exam Vitals and nursing note reviewed.  Constitutional:      General: He is not in acute distress.    Appearance: He is not ill-appearing, toxic-appearing or diaphoretic.  HENT:     Head: Normocephalic.  Eyes:     General: No scleral icterus.       Right eye: No discharge.        Left eye: No discharge.  Cardiovascular:     Rate and Rhythm: Normal rate.     Pulses:          Dorsalis pedis pulses are detected w/ Doppler on the right side and detected w/ Doppler on the left side.       Posterior tibial pulses are detected w/ Doppler on the right side and detected w/ Doppler on the left side.  Pulmonary:     Effort: Pulmonary effort is normal.  Feet:     Right foot:     Skin integrity: Dry skin present. No ulcer, blister, skin breakdown, erythema, warmth, callus or fissure.     Toenail Condition: Right toenails are abnormally thick.     Left foot:     Skin integrity: Callus and dry skin present. No ulcer, blister, skin breakdown, erythema, warmth or fissure.     Toenail Condition: Left toenails are abnormally thick.  Skin:    General: Skin is warm and dry.  Neurological:     General: No focal deficit present.     Mental Status: He is alert.     Cranial Nerves: No dysarthria or facial asymmetry.     Motor: No tremor or seizure activity.     Comments: +5 strength to dorsiflexion and plantar flexion to right ankle.  +3 strength to dorsi flexion and plantar flexion to left ankle.  Patient able to lift and hold both lower extremities against gravity without difficulty.  Patient moves bilateral upper extremities  without difficulty.  Decreased sensation to all digits of left foot.  Sensation intact to all digits of right foot.  Psychiatric:        Behavior: Behavior is cooperative.    ED Results / Procedures / Treatments   Labs (all labs ordered are listed, but only abnormal results are displayed) Labs Reviewed  CBC WITH DIFFERENTIAL/PLATELET - Abnormal; Notable for the following components:      Result Value   RBC 4.21 (*)    Hemoglobin 12.7 (*)    HCT 37.6 (*)    All other components within normal limits  BASIC METABOLIC PANEL  CK  HEPATIC FUNCTION PANEL    EKG None  Radiology No results found.  Procedures Procedures   Medications Ordered in ED Medications - No data to display  ED Course  I have reviewed the triage vital signs and the nursing notes.  Pertinent labs & imaging results that were available during my care of the patient were reviewed by me and considered in my medical decision making (see chart for details).    MDM Rules/Calculators/A&P                          Alert 55 year old male no acute distress, nontoxic-appearing.  Presents to the emergency department with a chief complaint of increased numbness and pain to left foot.  Per chart review patient has left leg weakness and numbness due to sciatic nerve damage.  Patient states that he has been off his gabapentin and medication for 2 weeks.  Over this time patient has had worsening pain and numbness to left foot.  No recent falls or injuries.  Due to patient's worsening of symptoms with standing and ambulation concern for possible claudication.  Attempted to obtain ABI at this time however this procedure is unavailable at this facility today.  Low suspicion for complete arterial occlusion at this time as pulses verified by Doppler bilaterally.  Patient given information to follow-up with his PCP for repeat evaluation and discussion of ABI evaluation in outpatient setting.  BMP, CK, hepatic function panel are all  unremarkable CBC shows slight anemia however improved from patient's baseline.  Suspect that patient's symptoms may be due to his documented sciatic nerve damage.  We will prescribe patient with previously prescribed gabapentin medication.  We will have patient follow-up with his PCP provider.  Discussed strict return precautions with patient and patient's family member at bedside.  Discussed results, findings, treatment and follow up. Patient advised of return precautions. Patient verbalized understanding and agreed with plan.  Patient care discussed with attending physician Dr. Melina Copa.     Final Clinical Impression(s) / ED Diagnoses Final diagnoses:  Left foot pain    Rx / DC Orders ED Discharge Orders          Ordered    gabapentin (NEURONTIN) 100 MG capsule  Daily        02/10/21 1306             Dyann Ruddle 02/10/21 1625    Hayden Rasmussen, MD 02/10/21 1753

## 2021-02-10 NOTE — ED Notes (Signed)
Spoke with Maggie in lab to add hepatic function to [employer]

## 2021-02-10 NOTE — ED Triage Notes (Signed)
States his left foot and leg have started hurting since the weather changed, but now the pain is impacting his sleep and ability to walk and stand. Pain/numbness is extending from the foot throughout leg, and into groin. Denies new injury to area. States he usually walks with a walker, but that he ran out of his gabapentin 2 weeks ago and the pain has increased since then.

## 2021-02-10 NOTE — ED Provider Notes (Signed)
Patient here today for evaluation of leg pain that he reports feels similar to initial presentation of rhabdo in the recent past. I recommended further evaluation in the ED as I suspect he will need stat labs. Patient is agreeable. Sister is here and can transport him from our office to local ED.    Tomi Bamberger, PA-C 02/10/21 1044

## 2021-03-30 ENCOUNTER — Ambulatory Visit: Payer: Medicaid Other | Attending: Critical Care Medicine | Admitting: Critical Care Medicine

## 2021-03-30 ENCOUNTER — Telehealth: Payer: Self-pay | Admitting: Critical Care Medicine

## 2021-03-30 ENCOUNTER — Encounter: Payer: Self-pay | Admitting: Critical Care Medicine

## 2021-03-30 ENCOUNTER — Other Ambulatory Visit: Payer: Self-pay

## 2021-03-30 VITALS — BP 108/76 | HR 84 | Resp 16

## 2021-03-30 DIAGNOSIS — D72829 Elevated white blood cell count, unspecified: Secondary | ICD-10-CM | POA: Insufficient documentation

## 2021-03-30 DIAGNOSIS — F411 Generalized anxiety disorder: Secondary | ICD-10-CM | POA: Insufficient documentation

## 2021-03-30 DIAGNOSIS — Z08 Encounter for follow-up examination after completed treatment for malignant neoplasm: Secondary | ICD-10-CM | POA: Insufficient documentation

## 2021-03-30 DIAGNOSIS — N179 Acute kidney failure, unspecified: Secondary | ICD-10-CM | POA: Insufficient documentation

## 2021-03-30 DIAGNOSIS — F1410 Cocaine abuse, uncomplicated: Secondary | ICD-10-CM | POA: Diagnosis not present

## 2021-03-30 DIAGNOSIS — Z79899 Other long term (current) drug therapy: Secondary | ICD-10-CM | POA: Diagnosis not present

## 2021-03-30 DIAGNOSIS — F1020 Alcohol dependence, uncomplicated: Secondary | ICD-10-CM | POA: Insufficient documentation

## 2021-03-30 DIAGNOSIS — E875 Hyperkalemia: Secondary | ICD-10-CM | POA: Insufficient documentation

## 2021-03-30 DIAGNOSIS — F1220 Cannabis dependence, uncomplicated: Secondary | ICD-10-CM | POA: Insufficient documentation

## 2021-03-30 DIAGNOSIS — S7402XS Injury of sciatic nerve at hip and thigh level, left leg, sequela: Secondary | ICD-10-CM | POA: Diagnosis not present

## 2021-03-30 DIAGNOSIS — T796XXS Traumatic ischemia of muscle, sequela: Secondary | ICD-10-CM

## 2021-03-30 DIAGNOSIS — Z139 Encounter for screening, unspecified: Secondary | ICD-10-CM

## 2021-03-30 DIAGNOSIS — R5600 Simple febrile convulsions: Secondary | ICD-10-CM | POA: Diagnosis not present

## 2021-03-30 DIAGNOSIS — F251 Schizoaffective disorder, depressive type: Secondary | ICD-10-CM | POA: Insufficient documentation

## 2021-03-30 DIAGNOSIS — Z0001 Encounter for general adult medical examination with abnormal findings: Secondary | ICD-10-CM | POA: Insufficient documentation

## 2021-03-30 DIAGNOSIS — M6282 Rhabdomyolysis: Secondary | ICD-10-CM | POA: Insufficient documentation

## 2021-03-30 DIAGNOSIS — X58XXXS Exposure to other specified factors, sequela: Secondary | ICD-10-CM | POA: Insufficient documentation

## 2021-03-30 MED ORDER — GABAPENTIN 300 MG PO CAPS
300.0000 mg | ORAL_CAPSULE | Freq: Three times a day (TID) | ORAL | 2 refills | Status: DC
  Filled 2021-03-30: qty 90, 30d supply, fill #0

## 2021-03-30 MED ORDER — CYCLOBENZAPRINE HCL 10 MG PO TABS
10.0000 mg | ORAL_TABLET | Freq: Three times a day (TID) | ORAL | 1 refills | Status: DC | PRN
  Filled 2021-03-30: qty 90, 30d supply, fill #0

## 2021-03-30 NOTE — Assessment & Plan Note (Signed)
His heavy history of Cocaine abuse has impacted his health will refer to clinical social work

## 2021-03-30 NOTE — Assessment & Plan Note (Signed)
Patient apparently not currently using cannabis

## 2021-03-30 NOTE — Assessment & Plan Note (Signed)
Patient claims his alcohol is no longer being used his sister is watching this we will have him connect with clinical social work

## 2021-03-30 NOTE — Assessment & Plan Note (Signed)
Need to reassess renal function 

## 2021-03-30 NOTE — Assessment & Plan Note (Signed)
Recheck metabolic panel.   

## 2021-03-30 NOTE — Assessment & Plan Note (Signed)
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

## 2021-03-30 NOTE — Telephone Encounter (Signed)
Patient will need assessment by you history of cocaine and alcohol use significant depression anxiety he has significant physical limitations which makes it hard for him to get into behavioral health appreciate your thoughts and connecting with him possibly by phone or video

## 2021-03-30 NOTE — Progress Notes (Signed)
New Patient Office Visit  Subjective:  Patient ID: Brett Butler, male    DOB: 03-21-1965  Age: 56 y.o. MRN: [id number]  CC:  Chief Complaint  Patient presents with   New Patient (Initial Visit)    HPI Brett Butler presents for this patient presents for primary care.  Patient has been followed previously at [facility] but when he achieve Medicaid they can no longer follow him.  He had a prolonged hospitalization in August 2022 where he was found unresponsive laying on 1 side developed severe acute rhabdomyolysis from breakdown of the thigh and calf muscles of his left leg.  He was intoxicated with cocaine had also opiates in the system responded to Narcan.  He had acute renal failure with hyperkalemia below is a copy of the discharge summary.  Date of admission: 09/28/2020             Date of discharge: 10/22/2020         Discharge Diagnoses:  Principal Problem:   ARF (acute renal failure) (HCC) Active Problems:   Rhabdomyolysis   Cocaine abuse (HCC)   Hyperkalemia   Leukocytosis   Injury of left sciatic nerve  History of present illness: As per the H and P dictated on admission, "Brett Butler is a 56 y.o. male with history of polysubstance abuse including cocaine was found to be lethargic after EMS was called by patient's friends.  Patient was given Narcan following which patient became more alert awake.  Patient states he snorted cocaine following which she fell onto the floor and had remained so for almost 12 hours.  Later on EMS was called and given Narcan and was brought to the ER.   ED Course: In the ER patient was noticed to not moving his left lower extremity and labs show markedly elevated CK levels more than 50,000 lactic acid of 5.1 WBC count was 27,000 LFTs were elevated with AST of 200 and ALT of 5200 total bilirubin was normal.  Potassium was 6.5 EKG showing peaked T waves.  Patient's creatinine about last year in June was 1.04.  Patient was given temporizing measures for the  hyperkalemia including calcium gluconate D50 insulin and Lokelma.  Patient underwent CT scan of the abdomen and left lower extremity which showed swelling of the left and external hamstring muscles concerning for myositis.  And also there was some concern for compartment syndrome.  Dr. [name] orthopedic surgeons was consulted and per orthopedic surgeon pressures checked did not show any signs of compartment syndrome.  But requested getting MRI of the L-spine since patient was complaining of low back pain and on my exam patient does have tenderness in the low back.  Patient unable to move his left lower extremity with no sensation in the distal half of the left lower extremity.  Good pulses.  COVID test was negative."   Hospital Course:  Summary of his active problems in the hospital is as following. 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 Urinalysis with significant hemoglobin, no RBCs. Nephrology consulted,  temporary HD catheter placed on 8/18 followed by dialysis 8/19. He has been getting dialysis per TTS schedule. Euvolemic on exam.  Output significant for last few days Nephrology currently holding HD given no indication. Renal function improving as well.  Monitor. Nephrology removed HD catheter. Outpt follow up recommended    Nontraumatic rhabdomyolysis :  Resolved. Secondary to prolonged time  lying down. Patient had significant amount of edema noted on imaging,  Orthopedics was consulted recommended no surgical management. CK trended down 272 ,  AST ALT trending down.   Acute anemia: Unknown etiology, No evidence of bleed. Possibly related to prior left thigh hematoma with possible persistent bleed. Patient has received 2 units of PRBCs. Hemoglobin remained stable.  No hematoma on ultrasound.   Left leg weakness / Neuropathic pain: This could be in  the setting of prolonged time on the floor,  likely sciatic nerve damage. PT and OT recommending CIR,  Tolerating gabapentin now.  Continue. There is no Achilles contracture, Ortho recommended PRAFO (foot drop splint).   Paroxysmal atrial fibrillation: Heart rate now controlled, TTE unremarkable.   Continue metoprolol.  No prior history.  Likely in the setting of dehydration on admission.  Currently no need for anticoagulation.   Hyperkalemia:  Due to AKI, treated with [facility], now resolved.   Hyponatremia: Resolved.   Hypocalcemia: Now hypercalcemia Improved with calcium and vitamin D supplementation. Discontinue vitamin D supplementation as well.   Elevated troponin: In the setting of rhabdomyolysis.  Possibly demand ischemia. Patient denied any chest pain.   Cocaine abuse: Patient reports he snorts cocaine.  He thinks the cocaine may have been laced with fentanyl.   Counseled pt against abuse Child psychotherapist consulted.   Constipation Continue MiraLAX and Dulcolax suppository:   Patient was seen by physical therapy, who recommended Home Health,. On the day of the discharge the patient's vitals were stable, and no other new acute medical condition were reported. The patient was felt safe to be discharge at Home with Therapy.   Consultants: Nephrology IR Procedures: IR guided HD catheter placement and removal.  HD  This patient did undergo dialysis during that hospitalization his electrolytes improved unfortunately has not had any lab data since August to follow-up.  Also he lost his Medicaid card and does not have Medicaid at this time.  He needs to reapply.  He also needs orders for more physical therapy.  He is essentially wheelchair-bound and can walk around with a walker.  He has severe chronic pain of the left lower extremities.  Patient does have residual static nerve involvement with lumbar disc disease.  He was responding to gabapentin but is run out of medications.  He  does smoke a pack a day of cigarettes.  Note he is assisted today with his sister had a whom he is living with now.  He is no longer homeless.  He does have severe depression and tendency to want to relapse with his cocaine use but he is clean currently.  He did not get follow-up with physical medicine after discharge.   History reviewed. No pertinent past medical history.  Past Surgical History:  Procedure Laterality Date   ANKLE SURGERY     IR FLUORO GUIDE CV LINE RIGHT  09/30/2020   IR FLUORO GUIDE CV LINE RIGHT  10/07/2020   IR REMOVAL TUN CV CATH W/O FL  10/22/2020   IR US GUIDE VASC ACCESS RIGHT  09/30/2020   IR US GUIDE VASC ACCESS RIGHT  10/07/2020    History reviewed. No pertinent family history.  Social History   Socioeconomic History   Marital status: Legally Separated    Spouse name: Not on file   Number of children: Not on file   Years of education: Not on file   Highest education level: Not on file  Occupational History   Not on file  Tobacco Use  Smoking status: Every Day    Types: Cigarettes   Smokeless tobacco: Never  Substance and Sexual Activity   Alcohol use: Yes    Alcohol/week: 10.0 standard drinks    Types: 10 Cans of beer per week   Drug use: Yes    Types: Cocaine   Sexual activity: Not on file  Other Topics Concern   Not on file  Social History Narrative   Not on file   Social Determinants of Health   Financial Resource Strain: Not on file  Food Insecurity: Not on file  Transportation Needs: Not on file  Physical Activity: Not on file  Stress: Not on file  Social Connections: Not on file  Intimate Partner Violence: Not on file    ROS Review of Systems  Constitutional: Negative.   HENT: Negative.  Negative for ear pain, postnasal drip, rhinorrhea, sinus pressure, sore throat, trouble swallowing and voice change.   Eyes: Negative.   Respiratory: Negative.  Negative for apnea, cough, choking, chest tightness, shortness of breath, wheezing  and stridor.   Cardiovascular: Negative.  Negative for chest pain, palpitations and leg swelling.  Gastrointestinal: Negative.  Negative for abdominal distention, abdominal pain, nausea and vomiting.  Genitourinary: Negative.   Musculoskeletal:  Positive for back pain and gait problem. Negative for arthralgias and myalgias.       LLE pain and weakness  Skin: Negative.  Negative for rash.  Allergic/Immunologic: Negative.  Negative for environmental allergies and food allergies.  Neurological:  Negative for dizziness, syncope, weakness and headaches.  Hematological: Negative.  Negative for adenopathy. Does not bruise/bleed easily.  Psychiatric/Behavioral:  Positive for confusion, decreased concentration, dysphoric mood and sleep disturbance. Negative for agitation and suicidal ideas. The patient is nervous/anxious.    Objective:   Today's Vitals: BP 108/76    Pulse 84    Resp 16    SpO2 96%   Physical Exam Vitals reviewed.  Constitutional:      Appearance: Normal appearance. He is well-developed. He is not diaphoretic.  HENT:     Head: Normocephalic and atraumatic.     Nose: No nasal deformity, septal deviation, mucosal edema or rhinorrhea.     Right Sinus: No maxillary sinus tenderness or frontal sinus tenderness.     Left Sinus: No maxillary sinus tenderness or frontal sinus tenderness.     Mouth/Throat:     Pharynx: No oropharyngeal exudate.  Eyes:     General: No scleral icterus.    Conjunctiva/sclera: Conjunctivae normal.     Pupils: Pupils are equal, round, and reactive to light.  Neck:     Thyroid: No thyromegaly.     Vascular: No carotid bruit or JVD.     Trachea: Trachea normal. No tracheal tenderness or tracheal deviation.  Cardiovascular:     Rate and Rhythm: Normal rate and regular rhythm.     Chest Wall: PMI is not displaced.     Pulses: Normal pulses. No decreased pulses.     Heart sounds: Normal heart sounds, S1 normal and S2 normal. Heart sounds not distant. No  murmur heard. No systolic murmur is present.  No diastolic murmur is present.    No friction rub. No gallop. No S3 or S4 sounds.  Pulmonary:     Effort: No tachypnea, accessory muscle usage or respiratory distress.     Breath sounds: No stridor. No decreased breath sounds, wheezing, rhonchi or rales.  Chest:     Chest wall: No tenderness.  Abdominal:     General: Bowel  sounds are normal. There is no distension.     Palpations: Abdomen is soft. Abdomen is not rigid.     Tenderness: There is no abdominal tenderness. There is no guarding or rebound.  Musculoskeletal:        General: Tenderness present. Normal range of motion.     Cervical back: Normal range of motion and neck supple. No edema, erythema or rigidity. No muscular tenderness. Normal range of motion.     Comments: Tenderness and atrophy of the left upper and lower extremity tenderness and atrophy left upper and lower extremity muscles  Lymphadenopathy:     Head:     Right side of head: No submental or submandibular adenopathy.     Left side of head: No submental or submandibular adenopathy.     Cervical: No cervical adenopathy.  Skin:    General: Skin is warm and dry.     Coloration: Skin is not pale.     Findings: No rash.     Nails: There is no clubbing.  Neurological:     Mental Status: He is alert and oriented to person, place, and time. Mental status is at baseline.     Sensory: No sensory deficit.     Motor: Weakness present.     Coordination: Coordination abnormal.     Gait: Gait abnormal.  Psychiatric:        Speech: Speech normal.        Behavior: Behavior normal.    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 likely exacerbated for prolonged pressure on this nerve when he was found unconscious in August of this year  Likely has some degree of neuropathy  We will resume  gabapentin 300 mg 3 times daily and cyclobenzaprine 10 mg 3 times daily for muscle relaxation  Plan to refer to physical medicine and also back to physical therapy      Relevant Medications   gabapentin (NEURONTIN) 300 MG capsule   cyclobenzaprine (FLEXERIL) 10 MG tablet     Musculoskeletal and Integument   Rhabdomyolysis - Primary    Patient suffered acute rhabdomyolysis we will need to recheck enzyme levels liver function and renal function he needs to go back to physical therapy and needs to be seen by physical medicine and rehab      Relevant Orders   Comprehensive metabolic panel   CBC with Differential/Platelet   CK   Ambulatory referral to Physical Medicine Rehab   Ambulatory referral to Physical Therapy   Phosphorus     Genitourinary   ARF (acute renal failure) (HCC)    Need to reassess renal function      Relevant Orders   Comprehensive metabolic panel     Other   Schizoaffective disorder, depressive type (HCC)   Cannabis use disorder, moderate, dependence ([place])    Patient apparently not currently using cannabis      Cocaine abuse ([place])    His heavy history of Cocaine abuse has impacted his health will refer to clinical social work      Hyperkalemia    Recheck metabolic panel      Alcohol dependence, uncomplicated ([place])    Patient claims his alcohol is no longer being used his sister is watching this we will have him connect with clinical social work  Generalized anxiety disorder    Referral to clinical social work      RESOLVED: Simple febrile convulsions ([place])   Relevant Medications   gabapentin (NEURONTIN) 300 MG capsule   Other Visit Diagnoses     Encounter for health-related screening       Relevant Orders   Lipid panel       Outpatient Encounter Medications as of 03/30/2021  Medication Sig   cyclobenzaprine (FLEXERIL) 10 MG tablet Take 1 tablet (10 mg total) by mouth 3 (three) times daily as needed for muscle spasms.   gabapentin  (NEURONTIN) 300 MG capsule Take 1 capsule (300 mg total) by mouth 3 (three) times daily.   [DISCONTINUED] docusate sodium (COLACE) 100 MG capsule Take 1 capsule (100 mg total) by mouth 2 (two) times daily as needed for mild constipation. (Patient not taking: Reported on 03/30/2021)   [DISCONTINUED] gabapentin (NEURONTIN) 100 MG capsule Take 2 capsules (200 mg total) by mouth daily.   [DISCONTINUED] gabapentin (NEURONTIN) 300 MG capsule Take 300 mg by mouth 3 (three) times daily.   [DISCONTINUED] metoprolol tartrate (LOPRESSOR) 25 MG tablet Take 1 tablet (25 mg total) by mouth 2 (two) times daily. (Patient not taking: Reported on 03/30/2021)   [DISCONTINUED] sevelamer carbonate (RENVELA) 800 MG tablet Take 1 tablet (800 mg total) by mouth 3 (three) times daily with meals. (Patient not taking: Reported on 03/30/2021)   No facility-administered encounter medications on file as of 03/30/2021.    Follow-up: Return in about 2 months (around 05/28/2021).   Asencion Noble, MD

## 2021-03-30 NOTE — Assessment & Plan Note (Signed)
Referral to clinical social work 

## 2021-03-30 NOTE — Assessment & Plan Note (Signed)
Patient suffered acute rhabdomyolysis we will need to recheck enzyme levels liver function and renal function he needs to go back to physical therapy and needs to be seen by physical medicine and rehab

## 2021-03-30 NOTE — Patient Instructions (Signed)
Start cyclobenzaprine one three times daily as needed for muscle spasm  Start Gabapentin 300mg  three times daily  Start lexapro one daily for depression  A referral to physical medicine/ rehab / physical therapy was made  [place] our clinical social worker will call you   Return Dr 2 months '

## 2021-03-31 ENCOUNTER — Telehealth: Payer: Self-pay

## 2021-03-31 LAB — COMPREHENSIVE METABOLIC PANEL
ALT: 11 IU/L (ref 0–44)
AST: 11 IU/L (ref 0–40)
Albumin/Globulin Ratio: 1.8 (ref 1.2–2.2)
Albumin: 4.5 g/dL (ref 3.8–4.9)
Alkaline Phosphatase: 45 IU/L (ref 44–121)
BUN/Creatinine Ratio: 21 — ABNORMAL HIGH (ref 9–20)
BUN: 16 mg/dL (ref 6–24)
Bilirubin Total: <0.2 mg/dL (ref 0.0–1.2)
CO2: 22 mmol/L (ref 20–29)
Calcium: 9.6 mg/dL (ref 8.7–10.2)
Chloride: 105 mmol/L (ref 96–106)
Creatinine, Ser: 0.77 mg/dL (ref 0.76–1.27)
Globulin, Total: 2.5 g/dL (ref 1.5–4.5)
Glucose: 91 mg/dL (ref 70–99)
Potassium: 4.6 mmol/L (ref 3.5–5.2)
Sodium: 142 mmol/L (ref 134–144)
Total Protein: 7 g/dL (ref 6.0–8.5)
eGFR: 104 mL/min/{1.73_m2} (ref >59)

## 2021-03-31 LAB — CBC WITH DIFFERENTIAL/PLATELET
Basophils Absolute: 0.1 10*3/uL (ref 0.0–0.2)
Basos: 1 %
EOS (ABSOLUTE): 0.4 10*3/uL (ref 0.0–0.4)
Eos: 5 %
Hematocrit: 38 % (ref 37.5–51.0)
Hemoglobin: 12.7 g/dL — ABNORMAL LOW (ref 13.0–17.7)
Immature Grans (Abs): 0.1 10*3/uL (ref 0.0–0.1)
Immature Granulocytes: 1 %
Lymphocytes Absolute: 3.5 10*3/uL — ABNORMAL HIGH (ref 0.7–3.1)
Lymphs: 43 %
MCH: 30.7 pg (ref 26.6–33.0)
MCHC: 33.4 g/dL (ref 31.5–35.7)
MCV: 92 fL (ref 79–97)
Monocytes Absolute: 0.5 10*3/uL (ref 0.1–0.9)
Monocytes: 6 %
Neutrophils Absolute: 3.5 10*3/uL (ref 1.4–7.0)
Neutrophils: 44 %
Platelets: 334 10*3/uL (ref 150–450)
RBC: 4.14 x10E6/uL (ref 4.14–5.80)
RDW: 13.8 % (ref 11.6–15.4)
WBC: 8 10*3/uL (ref 3.4–10.8)

## 2021-03-31 LAB — LIPID PANEL
Chol/HDL Ratio: 3.4 ratio (ref 0.0–5.0)
Cholesterol, Total: 189 mg/dL (ref 100–199)
HDL: 55 mg/dL (ref >39)
LDL Chol Calc (NIH): 120 mg/dL — ABNORMAL HIGH (ref 0–99)
Triglycerides: 78 mg/dL (ref 0–149)
VLDL Cholesterol Cal: 14 mg/dL (ref 5–40)

## 2021-03-31 LAB — CK: Total CK: 86 U/L (ref 41–331)

## 2021-03-31 NOTE — Telephone Encounter (Signed)
-----   Message from Storm Frisk, MD sent at 03/31/2021 12:41 PM EST ----- Let pt know:  kidney liver normal, cholesterol is high, recommend healthy low fat diet, no medication needed.      blood count normal,  no signs of muscle damage , muscle enzyme is normal

## 2021-03-31 NOTE — Telephone Encounter (Signed)
Pt was called and is aware of results, DOB was confirmed.  ?

## 2021-04-08 ENCOUNTER — Encounter: Payer: Self-pay | Admitting: Physical Medicine and Rehabilitation

## 2021-04-15 NOTE — Telephone Encounter (Signed)
I spoke with pt and scheduled appt for 05/11/21. ?

## 2021-05-11 ENCOUNTER — Ambulatory Visit: Payer: Medicaid Other | Attending: Critical Care Medicine | Admitting: Clinical

## 2021-05-11 DIAGNOSIS — F1421 Cocaine dependence, in remission: Secondary | ICD-10-CM

## 2021-05-11 DIAGNOSIS — F331 Major depressive disorder, recurrent, moderate: Secondary | ICD-10-CM

## 2021-05-11 DIAGNOSIS — F411 Generalized anxiety disorder: Secondary | ICD-10-CM | POA: Diagnosis not present

## 2021-05-23 NOTE — BH Specialist Note (Signed)
Integrated Behavioral Health Follow Up In-Person Visit ? ?MRN: [id number] ?Name: Brett Butler ? ?Number of Integrated Behavioral Health Clinician visits: 1- Initial Visit ? ?Session Start time: 1335 ?  ?Session End time: 1435 ? ?Total time in minutes: 60 ? ? ?Types of Service: Individual psychotherapy ? ?Interpretor:No. Interpretor Name and Language: N/A ? ?Subjective: ?Brett Butler is a 56 y.o. male accompanied by  self ?Patient was referred by Shan Levans, MD for hx of cocaine and alcohol use. ?Patient reports the following symptoms/concerns: Reports feeling depressed, trouble sleeping, self-esteem disturbance, anxiousness, excessive worrying, trouble relaxing, restlessness, and irritability. Reports that he was hospitalized in August 2022 after overdosing on what he believed initially was cocaine. Pt reports the thinks that his drugs may have been laced with heroin and fentanyl. Reports that he was on his knees for over 12 hours in front of someone's house last August after overdosing. Reports that he currently has physical health problems as a result of being on his knees for so long. Reports that he has stopped cocaine use. Reports a hx of alcohol use however reports he no longer uses this. Reports occasional marijuana use. Reports that it is challenging to not use drugs. Reports that he is currently staying with his sister. Reports that he feels bad for not being able to work like he used to prior to the injury. Reports experiencing pain. ?Duration of problem: 8 months; Severity of problem: severe ? ?Objective: ?Mood: Anxious and Depressed and Affect: Appropriate ?Risk of harm to self or others: No plan to harm self or others ? ?Life Context: ?Family and Social: Pt receives support from sister and currently lives with her. ?School/Work: Pt is unemployed. ?Self-Care: Pt has hx of cocaine use and alcohol use. Pt endorses occasional cannabis use. Pt denies hx of heroin use however pt's chart suggest hx of  heroin use.  ?Life Changes: Pt is experiencing physical health problems as a result of a drug overdose in August 2022 after he was found unconscious and on his knees for about 12 hours.  ? ?Patient and/or Family's Strengths/Protective Factors: ?Concrete supports in place (healthy food, safe environments, etc.) ? ?Goals Addressed: ?Patient will: ? Reduce symptoms of: anxiety and depression  ? Increase knowledge and/or ability of: coping skills and healthy habits  ? Demonstrate ability to: Increase healthy adjustment to current life circumstances and Decrease self-medicating behaviors ? ?Progress towards Goals: ?Ongoing ? ?Interventions: ?Interventions utilized:  Copywriter, advertising, CBT Cognitive Behavioral Therapy, Supportive Counseling, and Psychoeducation and/or Health Education ?Standardized Assessments completed: GAD-7 and PHQ 9 ? ?  05/11/2021  ?  1:54 PM 03/30/2021  ? 11:26 AM  ?GAD 7 : Generalized Anxiety Score  ?Nervous, Anxious, on Edge 3 0  ?Control/stop worrying 0 0  ?Worry too much - different things 0 0  ?Trouble relaxing 3 3  ?Restless 3 3  ?Easily annoyed or irritable 3 3  ?Afraid - awful might happen 2 3  ?Total GAD 7 Score 14 12  ? ?  ? ?  05/11/2021  ?  1:46 PM 03/30/2021  ? 11:25 AM  ?Depression screen PHQ 2/9  ?Decreased Interest 0 3  ?Down, Depressed, Hopeless 2 2  ?PHQ - 2 Score 2 5  ?Altered sleeping 3 3  ?Tired, decreased energy 0 3  ?Change in appetite 0 0  ?Feeling bad or failure about yourself  3 0  ?Trouble concentrating 0 2  ?Moving slowly or fidgety/restless 3 1  ?Suicidal thoughts 0 2  ?PHQ-9 Score  11 16  ?  ?Patient and/or Family Response: Pt receptive to psychoeducation provided on substance use, depression, and anxiety. Pt receptive to cognitive restructuring. Pt receptive to progressive muscle relaxation, deep breathing, and identifying additional enjoyable activities. Pt will consider outpatient therapy. ? ?Patient Centered Plan: ?Patient is on the following Treatment  Plan(s): Depression ? ?Assessment: ?Denies SI/HI. Denies auditory/visual hallucinations. Patient currently experiencing depression and anxiety related to physical health problems and adjusting to not using cocaine and alcohol. Pt has a hx of schizoaffective disorder and endorses a hx of hallucinations. Pt reports that he stopped experiencing hallucinations once he stopped drinking and using cocaine. Pt has hx of medication management. Pt appears to experience feelings of guilt related to hx of substance use which contribute to pt's depression. Pt is also having difficulty adjusting to physical health and not being able to move around easily.  ? ?Patient may benefit from psychiatry and outpatient therapy. Pt is not receptive to medication as he states he does not believe medication is effective. Pt will consider outpatient therapy. LCSW provided psychoeducation on substance use, depression, and anxiety. LCSW attempted to normalize substance use as coping skills to decrease pt's feelings of guilt. LCSW utilized cognitive restructuring to decrease unhelpful thoughts and assisted pt with processing reasoning for substance use. LCSW encouraged pt to utilize progressive muscle relaxation, deep breathing, and identify enjoyable activities. . ? ?Plan: ?Follow up with behavioral health clinician on : 06/01/21 ?Behavioral recommendations: Utilize progressive muscle relaxation, deep breathing, and identify enjoyable activities.  ?Referral(s): Integrated [employer] (In Clinic) ?"From scale of 1-10, how likely are you to follow plan?": 10 ? ?Kamsiyochukwu Spickler C Hong Moring, LCSW ? ? ?

## 2021-06-01 ENCOUNTER — Telehealth: Payer: Self-pay | Admitting: Critical Care Medicine

## 2021-06-01 ENCOUNTER — Ambulatory Visit: Payer: Medicaid Other | Admitting: Critical Care Medicine

## 2021-06-01 ENCOUNTER — Ambulatory Visit: Payer: Medicaid Other | Admitting: Clinical

## 2021-06-01 NOTE — Progress Notes (Incomplete)
? ? ?New Patient Office Visit ? ?Subjective:  ?Patient ID: Brett Butler, male    DOB: 06-Nov-1965  Age: 56 y.o. MRN: [id number] ? ?CC:  ?No chief complaint on file. ? ? ?HPI ?03/30/2021 ?Brett Butler presents for this patient presents for primary care.  Patient has been followed previously at [facility] but when he achieve Medicaid they can no longer follow him.  He had a prolonged hospitalization in August 2022 where he was found unresponsive laying on 1 side developed severe acute rhabdomyolysis from breakdown of the thigh and calf muscles of his left leg.  He was intoxicated with cocaine had also opiates in the system responded to Narcan.  He had acute renal failure with hyperkalemia below is a copy of the discharge summary. ? ?Date of admission: 09/28/2020             Date of discharge: 10/22/2020  ?    ?   ?Discharge Diagnoses:  ?Principal Problem: ?  ARF (acute renal failure) (HCC) ?Active Problems: ?  Rhabdomyolysis ?  Cocaine abuse (HCC) ?  Hyperkalemia ?  Leukocytosis ?  Injury of left sciatic nerve ? History of present illness: As per the H and P dictated on admission, "Brett Butler is a 56 y.o. male with history of polysubstance abuse including cocaine was found to be lethargic after EMS was called by patient's friends.  Patient was given Narcan following which patient became more alert awake.  Patient states he snorted cocaine following which she fell onto the floor and had remained so for almost 12 hours.  Later on EMS was called and given Narcan and was brought to the ER. ?  ?ED Course: In the ER patient was noticed to not moving his left lower extremity and labs show markedly elevated CK levels more than 50,000 lactic acid of 5.1 WBC count was 27,000 LFTs were elevated with AST of 200 and ALT of 5200 total bilirubin was normal.  Potassium was 6.5 EKG showing peaked T waves.  Patient's creatinine about last year in June was 1.04.  Patient was given temporizing measures for the hyperkalemia including calcium  gluconate D50 insulin and Lokelma.  Patient underwent CT scan of the abdomen and left lower extremity which showed swelling of the left and external hamstring muscles concerning for myositis.  And also there was some concern for compartment syndrome.  Dr. Linna Caprice orthopedic surgeons was consulted and per orthopedic surgeon pressures checked did not show any signs of compartment syndrome.  But requested getting MRI of the L-spine since patient was complaining of low back pain and on my exam patient does have tenderness in the low back.  Patient unable to move his left lower extremity with no sensation in the distal half of the left lower extremity.  Good pulses.  COVID test was negative." ?  ?Hospital Course:  ?Summary of his active problems in the hospital is as following. 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 ?Urinalysis with significant hemoglobin, no RBCs. ?Nephrology consulted,  temporary HD catheter placed on 8/18 followed by dialysis 8/19. ?He has been getting dialysis per TTS schedule. ?Euvolemic on exam.  Output significant for last few days ?Nephrology currently holding HD given no indication. ?Renal function improving as well.  Monitor. ?Nephrology removed HD catheter. Outpt follow up recommended  ?  ?Nontraumatic rhabdomyolysis :  Resolved. ?Secondary to prolonged time lying down. ?Patient had significant amount  of edema noted on imaging,  ?Orthopedics was consulted recommended no surgical management. ?CK trended down 272 ,  AST ALT trending down. ?  ?Acute anemia: ?Unknown etiology, No evidence of bleed. ?Possibly related to prior left thigh hematoma with possible persistent bleed. ?Patient has received 2 units of PRBCs. ?Hemoglobin remained stable.  No hematoma on ultrasound. ?  ?Left leg weakness / Neuropathic pain: ?This could be in the setting of prolonged time  on the floor,  likely sciatic nerve damage. ?PT and OT recommending CIR,  ?Tolerating gabapentin now.  Continue. ?There is no Achilles contracture, Ortho recommended PRAFO (foot drop splint). ?  ?Paroxysmal atrial fibrillation: ?Heart rate now controlled, TTE unremarkable.   ?Continue metoprolol.  No prior history.  Likely in the setting of dehydration on admission.  Currently no need for anticoagulation. ?  ?Hyperkalemia:  ?Due to AKI, treated with [facility], now resolved. ?  ?Hyponatremia: ?Resolved. ?  ?Hypocalcemia: Now hypercalcemia ?Improved with calcium and vitamin D supplementation. ?Discontinue vitamin D supplementation as well. ?  ?Elevated troponin: ?In the setting of rhabdomyolysis.  Possibly demand ischemia. ?Patient denied any chest pain. ?  ?Cocaine abuse: ?Patient reports he snorts cocaine.  He thinks the cocaine may have been laced with fentanyl.   ?Counseled pt against abuse ?Social worker consulted. ?  ?Constipation ?Continue MiraLAX and Dulcolax suppository: ?  ?Patient was seen by physical therapy, who recommended Home Health,. ?On the day of the discharge the patient's vitals were stable, and no other new acute medical condition were reported. The patient was felt safe to be discharge at Home with Therapy. ?  ?Consultants: Nephrology IR ?Procedures: IR guided HD catheter placement and removal.  ?HD ? ?This patient did undergo dialysis during that hospitalization his electrolytes improved unfortunately has not had any lab data since August to follow-up.  Also he lost his Medicaid card and does not have Medicaid at this time.  He needs to reapply.  He also needs orders for more physical therapy.  He is essentially wheelchair-bound and can walk around with a walker.  He has severe chronic pain of the left lower extremities.  Patient does have residual static nerve involvement with lumbar disc disease.  He was responding to gabapentin but is run out of medications.  He does smoke a pack a day of  cigarettes. ? ?Note he is assisted today with his sister had a whom he is living with now.  He is no longer homeless.  He does have severe depression and tendency to want to relapse with his cocaine use but he is clean currently.  He did not get follow-up with physical medicine after discharge. ? ?06/01/2021 ? Nervous and Auditory  ? Injury of left sciatic nerve  ?  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 ?Likely has some degree of neuropathy ? ?We will resume gabapentin 300 mg 3 times daily and cyclobenzaprine 10 mg 3 times daily for muscle relaxation ? ?Plan to refer to physical medicine and also back to physical therapy ?  ?  ? Relevant Medications  ? gabapentin (NEURONTIN) 300 MG capsule  ? cyclobenzaprine (FLEXERIL) 10 MG tablet  ?  ? Musculoskeletal and Integument  ? Rhabdomyolysis - Primary  ?  Patient suffered acute rhabdomyolysis we will need to recheck enzyme levels liver function and renal function he needs to go back to physical therapy and needs to  be seen by physical medicine and rehab ?  ?  ? Relevant Orders  ? Comprehensive metabolic panel  ? CBC with Differential/Platelet  ? CK  ? Ambulatory referral to Physical Medicine Rehab  ? Ambulatory referral to Physical Therapy  ? Phosphorus  ?  ? Genitourinary  ? ARF (acute renal failure) ([place])  ?  Need to reassess renal function ?  ?  ? Relevant Orders  ? Comprehensive metabolic panel  ?  ? Other  ? Schizoaffective disorder, depressive type ([place])  ? Cannabis use disorder, moderate, dependence ([place])  ?  Patient apparently not currently using cannabis ?  ?  ? Cocaine abuse ([place])  ?  His heavy history of Cocaine abuse has impacted his health will refer to clinical social work ?  ?  ? Hyperkalemia  ?  Recheck metabolic panel ?  ?  ? Alcohol dependence, uncomplicated ([place])  ?  Patient claims his alcohol is no longer being used his sister  is watching this we will have him connect with clinical social work ?  ?  ? Generalized anxiety disorder  ?  Referral to clinical social work ?  ?  ? RESOLVED: Simple febrile convulsions ([place])  ? Relevant

## 2021-06-01 NOTE — Telephone Encounter (Unsigned)
Copied from [place] [id number]. Topic: General - Other ?>> Jun 01, 2021  9:42 AM Leward Quan A wrote: ?Reason for CRM: Patient need a call back from Ms Leilani Merl to reschedule his appointment that he has today at 62 AM please call Ph# [phone] ?

## 2021-08-02 ENCOUNTER — Encounter: Payer: Self-pay | Admitting: Critical Care Medicine

## 2021-08-02 ENCOUNTER — Other Ambulatory Visit: Payer: Self-pay

## 2021-08-02 ENCOUNTER — Ambulatory Visit: Payer: Medicaid Other | Attending: Critical Care Medicine | Admitting: Critical Care Medicine

## 2021-08-02 VITALS — BP 108/74 | HR 84 | Wt 141.6 lb

## 2021-08-02 DIAGNOSIS — F411 Generalized anxiety disorder: Secondary | ICD-10-CM | POA: Diagnosis not present

## 2021-08-02 DIAGNOSIS — Z1211 Encounter for screening for malignant neoplasm of colon: Secondary | ICD-10-CM

## 2021-08-02 DIAGNOSIS — F1491 Cocaine use, unspecified, in remission: Secondary | ICD-10-CM

## 2021-08-02 DIAGNOSIS — I480 Paroxysmal atrial fibrillation: Secondary | ICD-10-CM | POA: Insufficient documentation

## 2021-08-02 DIAGNOSIS — F1410 Cocaine abuse, uncomplicated: Secondary | ICD-10-CM | POA: Diagnosis not present

## 2021-08-02 DIAGNOSIS — Z23 Encounter for immunization: Secondary | ICD-10-CM

## 2021-08-02 DIAGNOSIS — G5601 Carpal tunnel syndrome, right upper limb: Secondary | ICD-10-CM

## 2021-08-02 DIAGNOSIS — Z760 Encounter for issue of repeat prescription: Secondary | ICD-10-CM | POA: Insufficient documentation

## 2021-08-02 DIAGNOSIS — F251 Schizoaffective disorder, depressive type: Secondary | ICD-10-CM

## 2021-08-02 DIAGNOSIS — F17210 Nicotine dependence, cigarettes, uncomplicated: Secondary | ICD-10-CM | POA: Diagnosis not present

## 2021-08-02 DIAGNOSIS — S7402XS Injury of sciatic nerve at hip and thigh level, left leg, sequela: Secondary | ICD-10-CM | POA: Diagnosis not present

## 2021-08-02 DIAGNOSIS — N179 Acute kidney failure, unspecified: Secondary | ICD-10-CM | POA: Diagnosis not present

## 2021-08-02 DIAGNOSIS — E871 Hypo-osmolality and hyponatremia: Secondary | ICD-10-CM | POA: Diagnosis not present

## 2021-08-02 DIAGNOSIS — Z720 Tobacco use: Secondary | ICD-10-CM

## 2021-08-02 DIAGNOSIS — F1911 Other psychoactive substance abuse, in remission: Secondary | ICD-10-CM | POA: Insufficient documentation

## 2021-08-02 DIAGNOSIS — T796XXA Traumatic ischemia of muscle, initial encounter: Secondary | ICD-10-CM | POA: Diagnosis not present

## 2021-08-02 DIAGNOSIS — R195 Other fecal abnormalities: Secondary | ICD-10-CM | POA: Diagnosis not present

## 2021-08-02 DIAGNOSIS — Z79899 Other long term (current) drug therapy: Secondary | ICD-10-CM | POA: Insufficient documentation

## 2021-08-02 DIAGNOSIS — E875 Hyperkalemia: Secondary | ICD-10-CM

## 2021-08-02 DIAGNOSIS — F1020 Alcohol dependence, uncomplicated: Secondary | ICD-10-CM | POA: Diagnosis not present

## 2021-08-02 DIAGNOSIS — R29898 Other symptoms and signs involving the musculoskeletal system: Secondary | ICD-10-CM | POA: Insufficient documentation

## 2021-08-02 MED ORDER — CYCLOBENZAPRINE HCL 10 MG PO TABS
10.0000 mg | ORAL_TABLET | Freq: Three times a day (TID) | ORAL | 1 refills | Status: DC | PRN
  Filled 2021-08-02: qty 90, 30d supply, fill #0

## 2021-08-02 MED ORDER — ESCITALOPRAM OXALATE 10 MG PO TABS
10.0000 mg | ORAL_TABLET | Freq: Every day | ORAL | 2 refills | Status: DC
  Filled 2021-08-02 (×2): qty 30, 30d supply, fill #0

## 2021-08-02 NOTE — Assessment & Plan Note (Signed)
Exam is consistent with carpal tunnel syndrome will obtain for patient orthopedic referral

## 2021-08-02 NOTE — Assessment & Plan Note (Signed)
Resolved at the last lab

## 2021-08-02 NOTE — Patient Instructions (Signed)
You have carpal tunnel syndrome see attachment referral be made to orthopedic wrist surgeon  You have a physical medicine appointment on June 30 at 1:45 PM [address] physical therapy department please attend that visit  Referral to physical medicine doctor Dr. Posey Pronto was made  Colon cancer screening kit will be issued you will pick this up at the lab  Tetanus vaccine was given  Return to see Dr. Joya Gaskins 2 months  Once our new licensed clinical social worker is in practice we will refer you to her for more counseling, in the meantime begin Lexapro 1 pill daily for depression this was sent to the pharmacy downstairs  Refills on cyclobenzaprine sent to our pharmacy

## 2021-08-02 NOTE — Assessment & Plan Note (Signed)
Has returned to normal renal function normal last visit

## 2021-08-02 NOTE — Assessment & Plan Note (Signed)
Sent for fecal occult kit

## 2021-08-02 NOTE — Assessment & Plan Note (Addendum)
Remains depressed at this time plan is to begin Lexapro 10 mg daily and when our new social worker begins will have a referral

## 2021-08-02 NOTE — Progress Notes (Signed)
New Patient Office Visit  Subjective:  Patient ID: Brett Butler, male    DOB: [birth year]  Age: 56 y.o. MRN: [id number]  CC:  Chief Complaint  Patient presents with   Medication Refill    HPI 03/2021 [name] presents for this patient presents for primary care.  Patient has been followed previously at [facility] but when he achieve Medicaid they can no longer follow him.  He had a prolonged hospitalization in August 2022 where he was found unresponsive laying on 1 side developed severe acute rhabdomyolysis from breakdown of the thigh and calf muscles of his left leg.  He was intoxicated with cocaine had also opiates in the system responded to Narcan.  He had acute renal failure with hyperkalemia below is a copy of the discharge summary.  Date of admission: 09/28/2020             Date of discharge: 10/22/2020         Discharge Diagnoses:  Principal Problem:   ARF (acute renal failure) (HCC) Active Problems:   Rhabdomyolysis   Cocaine abuse (HCC)   Hyperkalemia   Leukocytosis   Injury of left sciatic nerve  History of present illness: As per the H and P dictated on admission, "Brett Butler is a 56 y.o. male with history of polysubstance abuse including cocaine was found to be lethargic after EMS was called by patient's friends.  Patient was given Narcan following which patient became more alert awake.  Patient states he snorted cocaine following which she fell onto the floor and had remained so for almost 12 hours.  Later on EMS was called and given Narcan and was brought to the ER.   ED Course: In the ER patient was noticed to not moving his left lower extremity and labs show markedly elevated CK levels more than 50,000 lactic acid of 5.1 WBC count was 27,000 LFTs were elevated with AST of 200 and ALT of 5200 total bilirubin was normal.  Potassium was 6.5 EKG showing peaked T waves.  Patient's creatinine about last year in June was 1.04.  Patient was given temporizing measures for the  hyperkalemia including calcium gluconate D50 insulin and Lokelma.  Patient underwent CT scan of the abdomen and left lower extremity which showed swelling of the left and external hamstring muscles concerning for myositis.  And also there was some concern for compartment syndrome.  Dr. [name] orthopedic surgeons was consulted and per orthopedic surgeon pressures checked did not show any signs of compartment syndrome.  But requested getting MRI of the L-spine since patient was complaining of low back pain and on my exam patient does have tenderness in the low back.  Patient unable to move his left lower extremity with no sensation in the distal half of the left lower extremity.  Good pulses.  COVID test was negative."   Hospital Course:  Summary of his active problems in the hospital is as following. 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 Urinalysis with significant hemoglobin, no RBCs. Nephrology consulted,  temporary HD catheter placed on 8/18 followed by dialysis 8/19. He has been getting dialysis per TTS schedule. Euvolemic on exam.  Output significant for last few days Nephrology currently holding HD given no indication. Renal function improving as well.  Monitor. Nephrology removed HD catheter. Outpt follow up recommended    Nontraumatic rhabdomyolysis :  Resolved. Secondary to prolonged time lying  down. Patient had significant amount of edema noted on imaging,  Orthopedics was consulted recommended no surgical management. CK trended down 272 ,  AST ALT trending down.   Acute anemia: Unknown etiology, No evidence of bleed. Possibly related to prior left thigh hematoma with possible persistent bleed. Patient has received 2 units of PRBCs. Hemoglobin remained stable.  No hematoma on ultrasound.   Left leg weakness / Neuropathic pain: This could be in  the setting of prolonged time on the floor,  likely sciatic nerve damage. PT and OT recommending CIR,  Tolerating gabapentin now.  Continue. There is no Achilles contracture, Ortho recommended PRAFO (foot drop splint).   Paroxysmal atrial fibrillation: Heart rate now controlled, TTE unremarkable.   Continue metoprolol.  No prior history.  Likely in the setting of dehydration on admission.  Currently no need for anticoagulation.   Hyperkalemia:  Due to AKI, treated with [facility], now resolved.   Hyponatremia: Resolved.   Hypocalcemia: Now hypercalcemia Improved with calcium and vitamin D supplementation. Discontinue vitamin D supplementation as well.   Elevated troponin: In the setting of rhabdomyolysis.  Possibly demand ischemia. Patient denied any chest pain.   Cocaine abuse: Patient reports he snorts cocaine.  He thinks the cocaine may have been laced with fentanyl.   Counseled pt against abuse Education officer, museum consulted.   Constipation Continue MiraLAX and Dulcolax suppository:   Patient was seen by physical therapy, who recommended Home Health,. On the day of the discharge the patient's vitals were stable, and no other new acute medical condition were reported. The patient was felt safe to be discharge at Home with Therapy.   Consultants: Nephrology IR Procedures: IR guided HD catheter placement and removal.  HD  This patient did undergo dialysis during that hospitalization his electrolytes improved unfortunately has not had any lab data since August to follow-up.  Also he lost his Medicaid card and does not have Medicaid at this time.  He needs to reapply.  He also needs orders for more physical therapy.  He is essentially wheelchair-bound and can walk around with a walker.  He has severe chronic pain of the left lower extremities.  Patient does have residual static nerve involvement with lumbar disc disease.  He was responding to gabapentin but is run out of medications.  He  does smoke a pack a day of cigarettes.  Note he is assisted today with his sister had a whom he is living with now.  He is no longer homeless.  He does have severe depression and tendency to want to relapse with his cocaine use but he is clean currently.  He did not get follow-up with physical medicine after discharge.  78/22 Is a 56 year old male prior history of rhabdomyolysis severe neuropathy after having been found unresponsive from a fentanyl overdose.  This patient in the interim has yet to see physical medicine or receive physical therapy even though we ordered both of these in February.  Patient also is due a tetanus vaccine he agrees to receive.  He needs colon cancer screening and agrees to receive the fecal occult kit.  Patient has schizoaffective disorder and severe anxiety he scores high on his suicide scale he does not have specific plans today but is thought about it.  His mother is in transition and hospice with metastatic cancer living in the same home.  Patient also was desponded over his lack of ability to walk.  He states the gabapentin did not help his pain much she is out  of this does not need to be refilled he states the cyclobenzaprine is helpful for his muscle spasm.  He does complain of right wrist pain which is new.  He has chronic bilateral knee arthritis.   Past Medical History:  Diagnosis Date   ARF (acute renal failure) ([place]) 09/28/2020   Hyperkalemia 09/29/2020    Past Surgical History:  Procedure Laterality Date   ANKLE SURGERY     IR FLUORO GUIDE CV LINE RIGHT  09/30/2020   IR FLUORO GUIDE CV LINE RIGHT  10/07/2020   IR REMOVAL TUN CV CATH W/O FL  10/22/2020   IR US GUIDE VASC ACCESS RIGHT  09/30/2020   IR US GUIDE VASC ACCESS RIGHT  10/07/2020    History reviewed. No pertinent family history.  Social History   Socioeconomic History   Marital status: Legally Separated    Spouse name: Not on file   Number of children: Not on file   Years of education: Not on  file   Highest education level: Not on file  Occupational History   Not on file  Tobacco Use   Smoking status: Every Day    Types: Cigarettes   Smokeless tobacco: Never  Substance and Sexual Activity   Alcohol use: Yes    Alcohol/week: 10.0 standard drinks of alcohol    Types: 10 Cans of beer per week   Drug use: Yes    Types: Cocaine   Sexual activity: Not on file  Other Topics Concern   Not on file  Social History Narrative   Not on file   Social Determinants of Health   Financial Resource Strain: Not on file  Food Insecurity: Not on file  Transportation Needs: Not on file  Physical Activity: Not on file  Stress: Not on file  Social Connections: Not on file  Intimate Partner Violence: Not on file    ROS Review of Systems  Constitutional: Negative.   HENT: Negative.  Negative for ear pain, postnasal drip, rhinorrhea, sinus pressure, sore throat, trouble swallowing and voice change.   Eyes: Negative.   Respiratory: Negative.  Negative for apnea, cough, choking, chest tightness, shortness of breath, wheezing and stridor.   Cardiovascular: Negative.  Negative for chest pain, palpitations and leg swelling.  Gastrointestinal: Negative.  Negative for abdominal distention, abdominal pain, nausea and vomiting.  Genitourinary: Negative.   Musculoskeletal:  Positive for back pain and gait problem. Negative for arthralgias and myalgias.       LLE pain and weakness  Skin: Negative.  Negative for rash.  Allergic/Immunologic: Negative.  Negative for environmental allergies and food allergies.  Neurological:  Negative for dizziness, syncope, weakness and headaches.  Hematological: Negative.  Negative for adenopathy. Does not bruise/bleed easily.  Psychiatric/Behavioral:  Positive for dysphoric mood, sleep disturbance and suicidal ideas. Negative for agitation, confusion and decreased concentration. The patient is not nervous/anxious.     Objective:   Today's Vitals: BP 108/74    Pulse 84   Wt 141 lb 9.6 oz (64.2 kg)   SpO2 98%   BMI 21.53 kg/m   Physical Exam Vitals reviewed.  Constitutional:      Appearance: Normal appearance. He is well-developed. He is not diaphoretic.  HENT:     Head: Normocephalic and atraumatic.     Nose: No nasal deformity, septal deviation, mucosal edema or rhinorrhea.     Right Sinus: No maxillary sinus tenderness or frontal sinus tenderness.     Left Sinus: No maxillary sinus tenderness or frontal sinus tenderness.  Mouth/Throat:     Pharynx: No oropharyngeal exudate.  Eyes:     General: No scleral icterus.    Conjunctiva/sclera: Conjunctivae normal.     Pupils: Pupils are equal, round, and reactive to light.  Neck:     Thyroid: No thyromegaly.     Vascular: No carotid bruit or JVD.     Trachea: Trachea normal. No tracheal tenderness or tracheal deviation.  Cardiovascular:     Rate and Rhythm: Normal rate and regular rhythm.     Chest Wall: PMI is not displaced.     Pulses: Normal pulses. No decreased pulses.     Heart sounds: Normal heart sounds, S1 normal and S2 normal. Heart sounds not distant. No murmur heard.    No systolic murmur is present.     No diastolic murmur is present.     No friction rub. No gallop. No S3 or S4 sounds.  Pulmonary:     Effort: No tachypnea, accessory muscle usage or respiratory distress.     Breath sounds: No stridor. No decreased breath sounds, wheezing, rhonchi or rales.  Chest:     Chest wall: No tenderness.  Abdominal:     General: Bowel sounds are normal. There is no distension.     Palpations: Abdomen is soft. Abdomen is not rigid.     Tenderness: There is no abdominal tenderness. There is no guarding or rebound.  Musculoskeletal:        General: Tenderness present. Normal range of motion.     Cervical back: Normal range of motion and neck supple. No edema, erythema or rigidity. No muscular tenderness. Normal range of motion.     Comments: Tenderness and atrophy of the left upper  and lower extremity tenderness and atrophy left upper and lower extremity muscles  Lymphadenopathy:     Head:     Right side of head: No submental or submandibular adenopathy.     Left side of head: No submental or submandibular adenopathy.     Cervical: No cervical adenopathy.  Skin:    General: Skin is warm and dry.     Coloration: Skin is not pale.     Findings: No rash.     Nails: There is no clubbing.  Neurological:     Mental Status: He is alert and oriented to person, place, and time. Mental status is at baseline.     Sensory: No sensory deficit.     Motor: Weakness present.     Coordination: Coordination abnormal.     Gait: Gait abnormal.  Psychiatric:        Attention and Perception: Attention and perception normal.        Mood and Affect: Mood is depressed.        Speech: Speech normal.        Behavior: Behavior normal.        Thought Content: Thought content includes suicidal ideation. Thought content does not include homicidal ideation. Thought content does not include homicidal or suicidal plan.        Cognition and Memory: Cognition and memory normal.        Judgment: Judgment normal.     Assessment & Plan:   Problem List Items Addressed This Visit       Nervous and Auditory   Injury of left sciatic nerve - Primary    Physical therapy and physical medicine referrals resent      Relevant Medications   cyclobenzaprine (FLEXERIL) 10 MG tablet   escitalopram (LEXAPRO) 10  MG tablet   Other Relevant Orders   Ambulatory referral to Physical Medicine Rehab   Carpal tunnel syndrome of right wrist    Exam is consistent with carpal tunnel syndrome will obtain for patient orthopedic referral      Relevant Medications   cyclobenzaprine (FLEXERIL) 10 MG tablet   escitalopram (LEXAPRO) 10 MG tablet   Other Relevant Orders   Ambulatory referral to Orthopedic Surgery     Musculoskeletal and Integument   Rhabdomyolysis    History of rhabdomyolysis at the last visit  his muscle enzymes were normal he still has severe muscle weakness and spasticity  Plan to renew cyclobenzaprine 10 mg 3 times daily  I personally called the physical therapy office they had his referral but had not acted upon it yet given the was February when the order was sent   On my request they obtain an appointment for the patient June 30 at 1:45 PM on the [facility] location  I will also resend the physical medicine referral        Relevant Orders   Ambulatory referral to Physical Medicine Rehab     Genitourinary   RESOLVED: ARF (acute renal failure) ([place])    Has returned to normal renal function normal last visit        Other   Schizoaffective disorder, depressive type ([place])    Remains depressed at this time plan is to begin Lexapro 10 mg daily and when our new social worker begins will have a referral       Relevant Medications   escitalopram (LEXAPRO) 10 MG tablet   History of cocaine use    Patient states he has not used cocaine since his original overdose      Alcohol dependence, uncomplicated ([place])    Patient is still drinking some alcohol but is markedly less in amount  Advise further abstinence from alcohol      Generalized anxiety disorder    Begin Lexapro 10 mg daily      Relevant Medications   escitalopram (LEXAPRO) 10 MG tablet   Tobacco abuse    Patient smoking 1 cigarillos a day advised to get nicotine replacement      Colon cancer screening    Sent for fecal occult kit      Relevant Orders   Fecal occult blood, imunochemical   RESOLVED: Hyperkalemia    Resolved at the last lab      Outpatient Encounter Medications as of 08/02/2021  Medication Sig   escitalopram (LEXAPRO) 10 MG tablet Take 1 tablet (10 mg total) by mouth once daily.   [DISCONTINUED] cyclobenzaprine (FLEXERIL) 10 MG tablet Take 1 tablet (10 mg total) by mouth 3 (three) times daily as needed for muscle spasms.   cyclobenzaprine (FLEXERIL) 10 MG tablet Take 1 tablet  (10 mg total) by mouth 3 (three) times daily as needed for muscle spasms.   [DISCONTINUED] gabapentin (NEURONTIN) 300 MG capsule Take 1 capsule (300 mg total) by mouth 3 (three) times daily.   No facility-administered encounter medications on file as of 08/02/2021.  38 min spent on history physical exam education of patient and finally obtaining a physical therapy appointment which I had to call personally to obtain  Tetanus vaccine was given Follow-up: Return in about 2 months (around 10/02/2021).   Asencion Noble, MD

## 2021-08-02 NOTE — Assessment & Plan Note (Signed)
Physical therapy and physical medicine referrals resent

## 2021-08-02 NOTE — Assessment & Plan Note (Signed)
History of rhabdomyolysis at the last visit his muscle enzymes were normal he still has severe muscle weakness and spasticity  Plan to renew cyclobenzaprine 10 mg 3 times daily  I personally called the physical therapy office they had his referral but had not acted upon it yet given the was February when the order was sent   On my request they obtain an appointment for the patient June 30 at 1:45 PM on the [facility] location  I will also resend the physical medicine referral

## 2021-08-02 NOTE — Assessment & Plan Note (Signed)
Patient is still drinking some alcohol but is markedly less in amount  Advise further abstinence from alcohol

## 2021-08-02 NOTE — Assessment & Plan Note (Signed)
Patient states he has not used cocaine since his original overdose

## 2021-08-02 NOTE — Assessment & Plan Note (Signed)
Begin Lexapro 10 mg daily.

## 2021-08-02 NOTE — Assessment & Plan Note (Signed)
Patient smoking 1 cigarillos a day advised to get nicotine replacement

## 2021-08-04 ENCOUNTER — Telehealth: Payer: Self-pay | Admitting: Critical Care Medicine

## 2021-08-04 NOTE — Telephone Encounter (Signed)
I spoke to [place] who will msg me a sooner appt   there was a misunderstanding as to why a second referral was sent.

## 2021-08-04 NOTE — Telephone Encounter (Signed)
FYI

## 2021-08-04 NOTE — Telephone Encounter (Signed)
[place] calling from [facility]  for Arna Medici is calling to report that a new referral was made for the patient. Has been working with the patient since February. And pt is scheduled appt 11/01/21 at 9:40a. Everything is in Epic. 2nd referral was denied.   Cb- [phone]

## 2021-08-05 ENCOUNTER — Ambulatory Visit: Payer: Medicaid Other | Admitting: Orthopedic Surgery

## 2021-08-05 LAB — FECAL OCCULT BLOOD, IMMUNOCHEMICAL: Fecal Occult Bld: NEGATIVE

## 2021-08-08 ENCOUNTER — Telehealth: Payer: Self-pay

## 2021-08-11 ENCOUNTER — Encounter
Payer: Medicaid Other | Attending: Physical Medicine and Rehabilitation | Admitting: Physical Medicine and Rehabilitation

## 2021-08-11 VITALS — BP 124/75 | HR 77 | Ht 68.0 in | Wt 145.0 lb

## 2021-08-11 DIAGNOSIS — G629 Polyneuropathy, unspecified: Secondary | ICD-10-CM | POA: Insufficient documentation

## 2021-08-11 DIAGNOSIS — G4701 Insomnia due to medical condition: Secondary | ICD-10-CM | POA: Insufficient documentation

## 2021-08-11 NOTE — Progress Notes (Signed)
Subjective:    Patient ID: Brett Butler, male    DOB: 01-Mar-1965, 56 y.o.   MRN: [id number]  HPI Brett Butler is a 55 year old man who presents to establish care for neuropathy  1) Neuropathy -started suddenly in September of last year -he work up and suddenly he could not stand on the leg -it has improved- it was all the way up in the leg and now is only on the calf and foot -he has tried gabapentin but did not feel like it was helping -he was on Gabapentin 300mg  and it did help but he felt it did not help enough so he stopped.   2) Insomnia: -usus muscle relaxers to help him sleep at night  Pain Inventory Average Pain 10 Pain Right Now 10 My pain is constant, sharp, burning, and aching  In the last 24 hours, has pain interfered with the following? General activity 10 Relation with others 10 Enjoyment of life 10 What TIME of day is your pain at its worst? morning  and night Sleep (in general) Fair  Pain is worse with: walking and bending Pain improves with: heat/ice Relief from Meds: 4  walk with assistance use a cane use a walker how many minutes can you walk? 40 ability to climb steps?  yes do you drive?  no  not employed: date last employed .  weakness numbness trouble walking spasms  New pt  New pt    No family history on file. Social History   Socioeconomic History   Marital status: Legally Separated    Spouse name: Not on file   Number of children: Not on file   Years of education: Not on file   Highest education level: Not on file  Occupational History   Not on file  Tobacco Use   Smoking status: Every Day    Types: Cigarettes   Smokeless tobacco: Never  Substance and Sexual Activity   Alcohol use: Yes    Alcohol/week: 10.0 standard drinks of alcohol    Types: 10 Cans of beer per week   Drug use: Yes    Types: Cocaine   Sexual activity: Not on file  Other Topics Concern   Not on file  Social History Narrative   Not on file    Social Determinants of Health   Financial Resource Strain: Not on file  Food Insecurity: Not on file  Transportation Needs: Not on file  Physical Activity: Not on file  Stress: Not on file  Social Connections: Not on file   Past Surgical History:  Procedure Laterality Date   ANKLE SURGERY     IR FLUORO GUIDE CV LINE RIGHT  09/30/2020   IR FLUORO GUIDE CV LINE RIGHT  10/07/2020   IR REMOVAL TUN CV CATH W/O FL  10/22/2020   IR 12/22/2020 GUIDE VASC ACCESS RIGHT  09/30/2020   IR 10/02/2020 GUIDE VASC ACCESS RIGHT  10/07/2020   Past Medical History:  Diagnosis Date   ARF (acute renal failure) (HCC) 09/28/2020   Hyperkalemia 09/29/2020   BP 124/75   Pulse 77   Ht 5\' 8"  (1.727 m)   Wt 145 lb (65.8 kg)   SpO2 97%   BMI 22.05 kg/m   Opioid Risk Score:   Fall Risk Score:  `1  Depression screen [facility] 2/9     08/02/2021   11:19 AM 05/11/2021    1:46 PM 03/30/2021   11:25 AM  Depression screen PHQ 2/9  Decreased Interest 1 0 3  Down, Depressed, Hopeless 1 2 2   PHQ - 2 Score 2 2 5   Altered sleeping 2 3 3   Tired, decreased energy 2 0 3  Change in appetite 1 0 0  Feeling bad or failure about yourself  2 3 0  Trouble concentrating 1 0 2  Moving slowly or fidgety/restless 1 3 1   Suicidal thoughts 1 0 2  PHQ-9 Score 12 11 16      Review of Systems  Respiratory:  Positive for cough and shortness of breath.   Musculoskeletal:  Positive for gait problem.       Lower left leg pain spasms  Neurological:  Positive for weakness and numbness.  All other systems reviewed and are negative.     Objective:   Physical Exam Gen: no distress, normal appearing HEENT: oral mucosa pink and moist, NCAT Cardio: Reg rate Chest: normal effort, normal rate of breathing Abd: soft, non-distended Ext: no edema Psych: pleasant, normal affect Skin: intact Neuro: Alert and oriented x3, decreased sensation Musculoskeletal: Uses cane for ambulation, left foot drop, using AFO       Assessment & Plan:   1(  Neuropathy -Discussed Qutenza as an option for neuropathic pain control. Discussed that this is a capsaicin patch, stronger than capsaicin cream. Discussed that it is currently approved for diabetic peripheral neuropathy and post-herpetic neuralgia, but that it has also shown benefit in treating other forms of neuropathy. Provided patient with link to site to learn more about the patch: . Discussed that the patch would be placed in office and benefits usually last 3 months. Discussed that unintended exposure to capsaicin can cause severe irritation of eyes, mucous membranes, respiratory tract, and skin, but that Qutenza is a local treatment and does not have the systemic side effects of other nerve medications. Discussed that there may be pain, itching, erythema, and decreased sensory function associated with the application of Qutenza. Side effects usually subside within 1 week. A cold pack of analgesic medications can help with these side effects. Blood pressure can also be increased due to pain associated with administration of the patch.   2) Insomnia: Gabapentin 300mg  HS  3) Left foot drop: -continue AFO -prescribed PT

## 2021-08-12 ENCOUNTER — Ambulatory Visit: Payer: Medicaid Other | Attending: Critical Care Medicine

## 2021-08-12 DIAGNOSIS — M6281 Muscle weakness (generalized): Secondary | ICD-10-CM | POA: Diagnosis present

## 2021-08-12 DIAGNOSIS — M79605 Pain in left leg: Secondary | ICD-10-CM | POA: Diagnosis present

## 2021-08-12 DIAGNOSIS — T796XXS Traumatic ischemia of muscle, sequela: Secondary | ICD-10-CM | POA: Insufficient documentation

## 2021-08-12 DIAGNOSIS — R2689 Other abnormalities of gait and mobility: Secondary | ICD-10-CM | POA: Insufficient documentation

## 2021-08-12 NOTE — Therapy (Signed)
OUTPATIENT PHYSICAL THERAPY LOWER EXTREMITY EVALUATION   Patient Name: Trisha Morandi MRN: [id number] DOB:05-Jan-1966, 56 y.o., male Today's Date: 08/12/2021   PT End of Session - 08/12/21 1454     Visit Number 1    Number of Visits 8    Date for PT Re-Evaluation 09/23/21    Authorization Type Medicaid    PT Start Time 1348    PT Stop Time 1430    PT Time Calculation (min) 42 min    Activity Tolerance Patient tolerated treatment well    Behavior During Therapy [facility] for tasks assessed/performed             Past Medical History:  Diagnosis Date   ARF (acute renal failure) (HCC) 09/28/2020   Hyperkalemia 09/29/2020   Past Surgical History:  Procedure Laterality Date   ANKLE SURGERY     IR FLUORO GUIDE CV LINE RIGHT  09/30/2020   IR FLUORO GUIDE CV LINE RIGHT  10/07/2020   IR REMOVAL TUN CV CATH W/O FL  10/22/2020   IR US GUIDE VASC ACCESS RIGHT  09/30/2020   IR US GUIDE VASC ACCESS RIGHT  10/07/2020   Patient Active Problem List   Diagnosis Date Noted   Tobacco abuse 08/02/2021   Carpal tunnel syndrome of right wrist 08/02/2021   Colon cancer screening 08/02/2021   Rhabdomyolysis 09/29/2020   History of cocaine use 09/29/2020   Injury of left sciatic nerve    Schizoaffective disorder, depressive type (HCC) 09/10/2019   Cannabis use disorder, moderate, dependence (HCC) 09/10/2019   PTSD (post-traumatic stress disorder) 09/10/2019   Alcohol dependence, uncomplicated (HCC) 04/28/2019   Generalized anxiety disorder 04/28/2019   Opioid dependence in remission (HCC) 04/28/2019    PCP: Storm Frisk, MD  REFERRING PROVIDER: Storm Frisk, MD  REFERRING DIAG: G62.9 (ICD-10-CM) - Neuropathy  THERAPY DIAG:  Pain in left leg  Muscle weakness (generalized)  Other abnormalities of gait and mobility  Rationale for Evaluation and Treatment Rehabilitation  ONSET DATE: August 2022  SUBJECTIVE:   SUBJECTIVE STATEMENT: Pt reports he started having L foot pain last  August, on a ladder at work, for unknown reason. A couple of weeks later, he thought he was doing cocaine, but it was really Fentanyl. It damaged his kidneys and he passed out for hours on his knees, causing his left knee to be stuck in a flexed position. 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 He used to have a brace for L knee, but he lost it. He was on dialysis for 3-4 weeks initially, which took the fluid out of his left leg.   PERTINENT HISTORY:   PAIN:  Are you having pain? Yes: NPRS scale: 0/10 Pain location: lower L leg Pain description: numbness mainly in calf, tingling in toes Aggravating factors: NA Relieving factors: NA  PRECAUTIONS: None  WEIGHT BEARING RESTRICTIONS No  FALLS:  Has patient fallen in last 6 months? No, stumbles but no falls  LIVING ENVIRONMENT: Lives with: lives with their family and mom, sister and her husband Lives in: House/apartment Stairs: Yes: External: 5 steps; can reach both Has following equipment at home: Single point cane, Walker - 2 wheeled, Crutches, shower chair, and shower chair too big for tub  OCCUPATION: disability  PLOF: Independent with household mobility with device  PATIENT GOALS Walk better, better balance   OBJECTIVE:   DIAGNOSTIC FINDINGS: NA  PATIENT SURVEYS:  LEFS 35/80  COGNITION:  Overall cognitive status: Within functional limits for tasks assessed     SENSATION: WFL  EDEMA:  NA  MUSCLE LENGTH: Hamstrings: Right 45 deg; Left 52 deg  POSTURE:  forward flexed posture, lean to R side, L knee flexion  PALPATION: NA  LOWER EXTREMITY ROM:  Active ROM Right eval Left eval  Hip flexion    Hip extension    Hip abduction    Hip adduction    Hip internal rotation    Hip external rotation    Knee flexion 130 123  Knee extension 0 10  Ankle dorsiflexion    Ankle plantarflexion    Ankle inversion    Ankle eversion     (Blank rows =  not tested)  LOWER EXTREMITY MMT:  MMT Right eval Left eval  Hip flexion 4+/5 4+/5  Hip extension    Hip abduction    Hip adduction    Hip internal rotation    Hip external rotation    Knee flexion 5/5 4-/5  Knee extension 5/5 3-/5  Ankle dorsiflexion 5/5 2/5  Ankle plantarflexion 5/5 3/5  Ankle inversion    Ankle eversion     (Blank rows = not tested)  LOWER EXTREMITY SPECIAL TESTS:  N/A  FUNCTIONAL TESTS:  Berg Balance Scale: assess next visit  GAIT: Distance walked: 100 Assistive device utilized: Crutches one on L side Level of assistance: Modified independence Comments: L knee flexed, slow gait speed, step to gait pattern    TODAY'S TREATMENT: See HEP   PATIENT EDUCATION:  Education details: Diagnosis, Prognosis, HEP, LEFS Person educated: Patient Education method: Explanation, Demonstration, Tactile cues, Verbal cues, and Handouts Education comprehension: verbalized understanding, returned demonstration, verbal cues required, and tactile cues required   HOME EXERCISE PROGRAM: Access Code: [email or web address] URL: [email or web address] Date: 08/12/2021 Prepared by: Gardiner Rhyme  Exercises - Seated Calf Stretch with Strap  - 2 x daily - 7 x weekly - 3 sets - 20-30 seconds hold - Mini Squat with Counter Support  - 1 x daily - 7 x weekly - 2 sets - 5-10 reps - Side to Side Weight Shift with Counter Support  - 1 x daily - 7 x weekly - 1-2 sets - 10 reps - Staggered Stance Forward Backward Weight Shift with Counter Support  - 1 x daily - 7 x weekly - 1-2 sets - 10 reps  ASSESSMENT:  CLINICAL IMPRESSION: Patient is a 56 y.o. male who was seen today for physical therapy evaluation and treatment for neuropathy related deficits in left lower extremity affecting ROM, strength and balance. Pt demonstrates difficulty walking, decreased L knee/ankle ROM, decreased strength in L LE strength, and impaired balance. He used crutch on left side to ambulate,  demonstrating much better balance with crutch on L versus R side. He was educated on diagnosis, prognosis, LEFS, HEP, and POC. He verbalized understanding and consent to treatment. He would benefit from skilled PT 1-2x/week for 6 weeks to address impairments and restore mobility and balance to decrease fall risk.   OBJECTIVE IMPAIRMENTS decreased activity tolerance, decreased balance, decreased mobility, difficulty walking, decreased ROM, decreased strength, increased fascial restrictions, impaired perceived functional ability, impaired flexibility, improper body mechanics, and pain.   ACTIVITY LIMITATIONS carrying, lifting, standing, squatting, stairs, bathing, and locomotion level  PARTICIPATION LIMITATIONS: meal prep, cleaning, laundry, and community activity  PERSONAL FACTORS Past/current experiences, Time since onset of injury/illness/exacerbation, Transportation, and 1 comorbidity: kidney disease  are also affecting patient's functional outcome.   REHAB  POTENTIAL: Good  CLINICAL DECISION MAKING: Stable/uncomplicated  EVALUATION COMPLEXITY: Low   GOALS: Goals reviewed with patient? No  SHORT TERM GOALS: Target date: 09/02/2021  Pt will be independent with initial HEP. Baseline: prescribed at initial eval Goal status: INITIAL  2.  Assess Berg Balance Test and create LTG. Baseline:  Goal status: INITIAL   LONG TERM GOALS: Target date: 09/23/2021   Pt will be independent with advanced HEP for continued progression of strength and balance. Baseline: not provided yet Goal status: INITIAL  2.  Pt will increased LEFS to at least 45/80 or greater, demonstrating improvement in functional mobility. Baseline: 35/80 Goal status: INITIAL  3.  Pt will increased L knee MMT to 4/5 for improved LE advancement and stability during gait. Baseline: 3-/5 to 4-/5 Goal status: INITIAL  4.  Pt will ambulate with step through gait pattern with no antalgic gait. Baseline: step to with  antalgia Goal status: INITIAL    PLAN: PT FREQUENCY: 1-2x/week  PT DURATION: 6 weeks  PLANNED INTERVENTIONS: Therapeutic exercises, Therapeutic activity, Neuromuscular re-education, Balance training, Gait training, Patient/Family education, Joint mobilization, Stair training, Dry Needling, Electrical stimulation, Cryotherapy, Moist heat, Taping, Vasopneumatic device, Ionotophoresis 4mg /ml Dexamethasone, Manual therapy, and Re-evaluation  PLAN FOR NEXT SESSION: BERG balance test, Assess response to HEP/update PRN, LE strengthening and flexibility, balance.   Izell [place], PT, DPT 08/12/2021, 2:56 PM

## 2021-08-24 ENCOUNTER — Ambulatory Visit: Payer: Medicaid Other | Attending: Critical Care Medicine

## 2021-08-24 DIAGNOSIS — M79605 Pain in left leg: Secondary | ICD-10-CM | POA: Insufficient documentation

## 2021-08-24 DIAGNOSIS — M6281 Muscle weakness (generalized): Secondary | ICD-10-CM | POA: Insufficient documentation

## 2021-08-24 DIAGNOSIS — R2689 Other abnormalities of gait and mobility: Secondary | ICD-10-CM | POA: Diagnosis present

## 2021-08-24 NOTE — Therapy (Signed)
OUTPATIENT PHYSICAL THERAPY TREATMENT NOTE   Patient Name: Brett Butler MRN: [id number] DOB:02-14-1966, 56 y.o., male Today's Date: 08/24/2021  PCP: Storm Frisk, MD REFERRING PROVIDER: Storm Frisk, MD  END OF SESSION:   PT End of Session - 08/24/21 1447     Visit Number 2    Number of Visits 8    Date for PT Re-Evaluation 09/23/21    Authorization Type Medicaid    Authorization Time Period 7/1-7/15/23    Authorization - Visit Number 1    Authorization - Number of Visits 3    PT Start Time 1447    PT Stop Time 1527    PT Time Calculation (min) 40 min    Equipment Utilized During Treatment Gait belt    Activity Tolerance Patient tolerated treatment well    Behavior During Therapy WFL for tasks assessed/performed             Past Medical History:  Diagnosis Date   ARF (acute renal failure) (HCC) 09/28/2020   Hyperkalemia 09/29/2020   Past Surgical History:  Procedure Laterality Date   ANKLE SURGERY     IR FLUORO GUIDE CV LINE RIGHT  09/30/2020   IR FLUORO GUIDE CV LINE RIGHT  10/07/2020   IR REMOVAL TUN CV CATH W/O FL  10/22/2020   IR US GUIDE VASC ACCESS RIGHT  09/30/2020   IR US GUIDE VASC ACCESS RIGHT  10/07/2020   Patient Active Problem List   Diagnosis Date Noted   Tobacco abuse 08/02/2021   Carpal tunnel syndrome of right wrist 08/02/2021   Colon cancer screening 08/02/2021   Rhabdomyolysis 09/29/2020   History of cocaine use 09/29/2020   Injury of left sciatic nerve    Schizoaffective disorder, depressive type (HCC) 09/10/2019   Cannabis use disorder, moderate, dependence (HCC) 09/10/2019   PTSD (post-traumatic stress disorder) 09/10/2019   Alcohol dependence, uncomplicated (HCC) 04/28/2019   Generalized anxiety disorder 04/28/2019   Opioid dependence in remission (HCC) 04/28/2019    REFERRING DIAG: G62.9 (ICD-10-CM) - Neuropathy  THERAPY DIAG:  Pain in left leg  Muscle weakness (generalized)  Other abnormalities of gait and  mobility  Rationale for Evaluation and Treatment Rehabilitation  PERTINENT HISTORY: None  PRECAUTIONS: None  SUBJECTIVE: Patient reports he fell since the initial evaluation. He had too much weight on the LLE when trying to pick something up and fell over. He has not been completing his HEP.   PAIN:  Are you having pain? Yes: NPRS scale: 8/10 Pain location: Lt calf and foot Pain description: numbness/tingling/stiffness Aggravating factors: prolonged sitting Relieving factors: walking   OBJECTIVE: (objective measures completed at initial evaluation unless otherwise dated)  DIAGNOSTIC FINDINGS: NA   PATIENT SURVEYS:  LEFS 35/80   COGNITION:           Overall cognitive status: Within functional limits for tasks assessed                          SENSATION: WFL   EDEMA:  NA   MUSCLE LENGTH: Hamstrings: Right 45 deg; Left 52 deg   POSTURE:  forward flexed posture, lean to R side, L knee flexion   PALPATION: NA   LOWER EXTREMITY ROM:   Active ROM Right eval Left eval  Hip flexion      Hip extension      Hip abduction      Hip adduction      Hip internal rotation  Hip external rotation      Knee flexion 130 123  Knee extension 0 10  Ankle dorsiflexion      Ankle plantarflexion      Ankle inversion      Ankle eversion       (Blank rows = not tested)   LOWER EXTREMITY MMT:   MMT Right eval Left eval  Hip flexion 4+/5 4+/5  Hip extension      Hip abduction      Hip adduction      Hip internal rotation      Hip external rotation      Knee flexion 5/5 4-/5  Knee extension 5/5 3-/5  Ankle dorsiflexion 5/5 2/5  Ankle plantarflexion 5/5 3/5  Ankle inversion      Ankle eversion       (Blank rows = not tested)   LOWER EXTREMITY SPECIAL TESTS:  N/A   FUNCTIONAL TESTS:  Berg Balance Scale: 08/24/21 BERG BALANCE TEST Sitting to Standing: 4.      Stands without using hands and stabilize independently Standing Unsupported: 4.      Stands safely for 2  minutes Sitting Unsupported: 4.     Sits for 2 minutes independently Standing to Sitting: 4.     Sits safely with minimal use of hands Transfers: 3.     Transfers safely definite use of hands Standing with eyes closed: 3.     Stands 10 seconds with supervision Standing with feet together: 3.     Stands for 1 minute with supervision Reaching forward with outstretched arm: 3.     Reaches forward 5 inches Retrieving object from the floor: 3.     Able to pick up with supervision Turning to look behind: 1.     Needs supervision when turning Turning 360 degrees: 2.     Able to turn slowly, but safely Place alternate foot on stool: 0.     Unable, needs assist to keep from falling Standing with one foot in front: 0.     Loses balance while standing/stepping Standing on one foot: 0.     Unable  Total Score: 34/56    GAIT: Distance walked: 100 Assistive device utilized: Crutches one on L side Level of assistance: Modified independence Comments: L knee flexed, slow gait speed, step to gait pattern       TODAY'S TREATMENT: [facility] Adult PT Treatment:                                                DATE: 08/24/21 Therapeutic Exercise: LAQ LLE 2 x 10  Seated heel raises and toe raises 2 x 10  Mini squats x 5 Weight shifts lateral and forward/backward x 5 each  Reviewed HEP   Neuromuscular re-ed: Balance activity as part of BERG balance test   Self Care: Discussed appropriate wear of AFO.       PATIENT EDUCATION:  Education details: HEP Person educated: Patient Education method: Programmer, multimedia, demo, cues Education comprehension: verbalized understanding, returned demo, needs further instruction     HOME EXERCISE PROGRAM: Access Code: [email or web address] URL: [email or web address] Date: 08/12/2021 Prepared by: Gardiner Rhyme   Exercises - Seated Calf Stretch with Strap  - 2 x daily - 7 x weekly - 3 sets - 20-30 seconds hold - Mini Squat with Counter Support  - 1 x daily - 7 x  weekly  - 2 sets - 5-10 reps - Side to Side Weight Shift with Counter Support  - 1 x daily - 7 x weekly - 1-2 sets - 10 reps - Staggered Stance Forward Backward Weight Shift with Counter Support  - 1 x daily - 7 x weekly - 1-2 sets - 10 reps   ASSESSMENT:   CLINICAL IMPRESSION: BERG balance assessment completed with patient scoring at an increased fall risk based upon his score. He has the most difficulty with tasks that require SLS on the LLE as he is unable to maintain his balance at this time.  Able to initiate LE strengthening focusing on the knee and ankle, which he tolerates well. He is able to minimally lift his toes on the LLE during seated toe raises secondary to weakness. Time spent reviewing HEP, educating patient on importance of complying with prescribed exercises with patient verbalizing understanding.      OBJECTIVE IMPAIRMENTS decreased activity tolerance, decreased balance, decreased mobility, difficulty walking, decreased ROM, decreased strength, increased fascial restrictions, impaired perceived functional ability, impaired flexibility, improper body mechanics, and pain.    ACTIVITY LIMITATIONS carrying, lifting, standing, squatting, stairs, bathing, and locomotion level   PARTICIPATION LIMITATIONS: meal prep, cleaning, laundry, and community activity   PERSONAL FACTORS Past/current experiences, Time since onset of injury/illness/exacerbation, Transportation, and 1 comorbidity: kidney disease  are also affecting patient's functional outcome.    REHAB POTENTIAL: Good   CLINICAL DECISION MAKING: Stable/uncomplicated   EVALUATION COMPLEXITY: Low     GOALS: Goals reviewed with patient? No   SHORT TERM GOALS: Target date: 09/02/2021  Pt will be independent with initial HEP. Baseline: prescribed at initial eval Goal status: INITIAL   2.  Assess Berg Balance Test and create LTG. Baseline:  Goal status: achieved      LONG TERM GOALS: Target date: 09/23/2021    Pt will be  independent with advanced HEP for continued progression of strength and balance. Baseline: not provided yet Goal status: INITIAL   2.  Pt will increased LEFS to at least 45/80 or greater, demonstrating improvement in functional mobility. Baseline: 35/80 Goal status: INITIAL   3.  Pt will increased L knee MMT to 4/5 for improved LE advancement and stability during gait. Baseline: 3-/5 to 4-/5 Goal status: INITIAL   4.  Pt will ambulate with step through gait pattern with no antalgic gait. Baseline: step to with antalgia Goal status: INITIAL    5.  Pt will score 39/56 on the BERG to signify a decrease in fall risk.  Baseline: see above  Goal status: INITIAL     PLAN: PT FREQUENCY: 1-2x/week   PT DURATION: 6 weeks   PLANNED INTERVENTIONS: Therapeutic exercises, Therapeutic activity, Neuromuscular re-education, Balance training, Gait training, Patient/Family education, Joint mobilization, Stair training, Dry Needling, Electrical stimulation, Cryotherapy, Moist heat, Taping, Vasopneumatic device, Ionotophoresis 4mg /ml Dexamethasone, Manual therapy, and Re-evaluation   PLAN FOR NEXT SESSION:  Assess response to HEP/update PRN, LE strengthening and flexibility, balance.  , PT, DPT, ATC 08/24/21 3:30 PM

## 2021-08-30 NOTE — Therapy (Deleted)
OUTPATIENT PHYSICAL THERAPY TREATMENT NOTE   Patient Name: Brett Butler MRN: [id number] DOB:09-05-1965, 56 y.o., male Today's Date: 08/30/2021  PCP: Storm Frisk, MD REFERRING PROVIDER: Storm Frisk, MD  END OF SESSION:     Past Medical History:  Diagnosis Date   ARF (acute renal failure) (HCC) 09/28/2020   Hyperkalemia 09/29/2020   Past Surgical History:  Procedure Laterality Date   ANKLE SURGERY     IR FLUORO GUIDE CV LINE RIGHT  09/30/2020   IR FLUORO GUIDE CV LINE RIGHT  10/07/2020   IR REMOVAL TUN CV CATH W/O FL  10/22/2020   IR US GUIDE VASC ACCESS RIGHT  09/30/2020   IR US GUIDE VASC ACCESS RIGHT  10/07/2020   Patient Active Problem List   Diagnosis Date Noted   Tobacco abuse 08/02/2021   Carpal tunnel syndrome of right wrist 08/02/2021   Colon cancer screening 08/02/2021   Rhabdomyolysis 09/29/2020   History of cocaine use 09/29/2020   Injury of left sciatic nerve    Schizoaffective disorder, depressive type (HCC) 09/10/2019   Cannabis use disorder, moderate, dependence (HCC) 09/10/2019   PTSD (post-traumatic stress disorder) 09/10/2019   Alcohol dependence, uncomplicated (HCC) 04/28/2019   Generalized anxiety disorder 04/28/2019   Opioid dependence in remission (HCC) 04/28/2019    REFERRING DIAG: G62.9 (ICD-10-CM) - Neuropathy  THERAPY DIAG:  No diagnosis found.  Rationale for Evaluation and Treatment Rehabilitation  PERTINENT HISTORY: None  PRECAUTIONS: None  SUBJECTIVE: Patient reports he fell since the initial evaluation. He had too much weight on the LLE when trying to pick something up and fell over. He has not been completing his HEP.   PAIN:  Are you having pain? Yes: NPRS scale: 8/10 Pain location: Lt calf and foot Pain description: numbness/tingling/stiffness Aggravating factors: prolonged sitting Relieving factors: walking   OBJECTIVE: (objective measures completed at initial evaluation unless otherwise dated)  DIAGNOSTIC  FINDINGS: NA   PATIENT SURVEYS:  LEFS 35/80   COGNITION:           Overall cognitive status: Within functional limits for tasks assessed                          SENSATION: WFL   EDEMA:  NA   MUSCLE LENGTH: Hamstrings: Right 45 deg; Left 52 deg   POSTURE:  forward flexed posture, lean to R side, L knee flexion   PALPATION: NA   LOWER EXTREMITY ROM:   Active ROM Right eval Left eval  Hip flexion      Hip extension      Hip abduction      Hip adduction      Hip internal rotation      Hip external rotation      Knee flexion 130 123  Knee extension 0 10  Ankle dorsiflexion      Ankle plantarflexion      Ankle inversion      Ankle eversion       (Blank rows = not tested)   LOWER EXTREMITY MMT:   MMT Right eval Left eval  Hip flexion 4+/5 4+/5  Hip extension      Hip abduction      Hip adduction      Hip internal rotation      Hip external rotation      Knee flexion 5/5 4-/5  Knee extension 5/5 3-/5  Ankle dorsiflexion 5/5 2/5  Ankle plantarflexion 5/5 3/5  Ankle inversion  Ankle eversion       (Blank rows = not tested)   LOWER EXTREMITY SPECIAL TESTS:  N/A   FUNCTIONAL TESTS:  Berg Balance Scale: 08/24/21 BERG BALANCE TEST Sitting to Standing: 4.      Stands without using hands and stabilize independently Standing Unsupported: 4.      Stands safely for 2 minutes Sitting Unsupported: 4.     Sits for 2 minutes independently Standing to Sitting: 4.     Sits safely with minimal use of hands Transfers: 3.     Transfers safely definite use of hands Standing with eyes closed: 3.     Stands 10 seconds with supervision Standing with feet together: 3.     Stands for 1 minute with supervision Reaching forward with outstretched arm: 3.     Reaches forward 5 inches Retrieving object from the floor: 3.     Able to pick up with supervision Turning to look behind: 1.     Needs supervision when turning Turning 360 degrees: 2.     Able to turn slowly, but  safely Place alternate foot on stool: 0.     Unable, needs assist to keep from falling Standing with one foot in front: 0.     Loses balance while standing/stepping Standing on one foot: 0.     Unable  Total Score: 34/56    GAIT: Distance walked: 100 Assistive device utilized: Crutches one on L side Level of assistance: Modified independence Comments: L knee flexed, slow gait speed, step to gait pattern       TODAY'S TREATMENT: [facility] Adult PT Treatment:                                                DATE: 08/24/21 Therapeutic Exercise: LAQ LLE 2 x 10  Seated heel raises and toe raises 2 x 10  Mini squats x 5 Weight shifts lateral and forward/backward x 5 each  Reviewed HEP   Neuromuscular re-ed: Balance activity as part of BERG balance test   Self Care: Discussed appropriate wear of AFO.       PATIENT EDUCATION:  Education details: HEP Person educated: Patient Education method: Consulting civil engineer, demo, cues Education comprehension: verbalized understanding, returned demo, needs further instruction     HOME EXERCISE PROGRAM: Access Code: [email or web address] URL: [email or web address] Date: 08/12/2021 Prepared by: Kathreen Cornfield   Exercises - Seated Calf Stretch with Strap  - 2 x daily - 7 x weekly - 3 sets - 20-30 seconds hold - Mini Squat with Counter Support  - 1 x daily - 7 x weekly - 2 sets - 5-10 reps - Side to Side Weight Shift with Counter Support  - 1 x daily - 7 x weekly - 1-2 sets - 10 reps - Staggered Stance Forward Backward Weight Shift with Counter Support  - 1 x daily - 7 x weekly - 1-2 sets - 10 reps   ASSESSMENT:   CLINICAL IMPRESSION: BERG balance assessment completed with patient scoring at an increased fall risk based upon his score. He has the most difficulty with tasks that require SLS on the LLE as he is unable to maintain his balance at this time.  Able to initiate LE strengthening focusing on the knee and ankle, which he tolerates well. He is able  to minimally lift his toes on the LLE during seated toe  raises secondary to weakness. Time spent reviewing HEP, educating patient on importance of complying with prescribed exercises with patient verbalizing understanding.      OBJECTIVE IMPAIRMENTS decreased activity tolerance, decreased balance, decreased mobility, difficulty walking, decreased ROM, decreased strength, increased fascial restrictions, impaired perceived functional ability, impaired flexibility, improper body mechanics, and pain.    ACTIVITY LIMITATIONS carrying, lifting, standing, squatting, stairs, bathing, and locomotion level   PARTICIPATION LIMITATIONS: meal prep, cleaning, laundry, and community activity   PERSONAL FACTORS Past/current experiences, Time since onset of injury/illness/exacerbation, Transportation, and 1 comorbidity: kidney disease  are also affecting patient's functional outcome.    REHAB POTENTIAL: Good   CLINICAL DECISION MAKING: Stable/uncomplicated   EVALUATION COMPLEXITY: Low     GOALS: Goals reviewed with patient? No   SHORT TERM GOALS: Target date: 09/02/2021  Pt will be independent with initial HEP. Baseline: prescribed at initial eval Goal status: INITIAL   2.  Assess Berg Balance Test and create LTG. Baseline:  Goal status: achieved      LONG TERM GOALS: Target date: 09/23/2021    Pt will be independent with advanced HEP for continued progression of strength and balance. Baseline: not provided yet Goal status: INITIAL   2.  Pt will increased LEFS to at least 45/80 or greater, demonstrating improvement in functional mobility. Baseline: 35/80 Goal status: INITIAL   3.  Pt will increased L knee MMT to 4/5 for improved LE advancement and stability during gait. Baseline: 3-/5 to 4-/5 Goal status: INITIAL   4.  Pt will ambulate with step through gait pattern with no antalgic gait. Baseline: step to with antalgia Goal status: INITIAL    5.  Pt will score 39/56 on the BERG to  signify a decrease in fall risk.  Baseline: see above  Goal status: INITIAL     PLAN: PT FREQUENCY: 1-2x/week   PT DURATION: 6 weeks   PLANNED INTERVENTIONS: Therapeutic exercises, Therapeutic activity, Neuromuscular re-education, Balance training, Gait training, Patient/Family education, Joint mobilization, Stair training, Dry Needling, Electrical stimulation, Cryotherapy, Moist heat, Taping, Vasopneumatic device, Ionotophoresis 4mg /ml Dexamethasone, Manual therapy, and Re-evaluation   PLAN FOR NEXT SESSION:  Assess response to HEP/update PRN, LE strengthening and flexibility, balance.  , PT, DPT, ATC 08/30/21 10:40 AM

## 2021-08-31 ENCOUNTER — Ambulatory Visit: Payer: Medicaid Other

## 2021-08-31 NOTE — Therapy (Incomplete)
OUTPATIENT PHYSICAL THERAPY TREATMENT NOTE   Patient Name: Brett Butler MRN: [id number] DOB:1965/11/05, 56 y.o., male Today's Date: 08/30/2021  PCP: Storm Frisk, MD REFERRING PROVIDER: Storm Frisk, MD  END OF SESSION:     Past Medical History:  Diagnosis Date  . ARF (acute renal failure) (HCC) 09/28/2020  . Hyperkalemia 09/29/2020   Past Surgical History:  Procedure Laterality Date  . ANKLE SURGERY    . IR FLUORO GUIDE CV LINE RIGHT  09/30/2020  . IR FLUORO GUIDE CV LINE RIGHT  10/07/2020  . IR REMOVAL TUN CV CATH W/O FL  10/22/2020  . IR US GUIDE VASC ACCESS RIGHT  09/30/2020  . IR US GUIDE VASC ACCESS RIGHT  10/07/2020   Patient Active Problem List   Diagnosis Date Noted  . Tobacco abuse 08/02/2021  . Carpal tunnel syndrome of right wrist 08/02/2021  . Colon cancer screening 08/02/2021  . Rhabdomyolysis 09/29/2020  . History of cocaine use 09/29/2020  . Injury of left sciatic nerve   . Schizoaffective disorder, depressive type (HCC) 09/10/2019  . Cannabis use disorder, moderate, dependence (HCC) 09/10/2019  . PTSD (post-traumatic stress disorder) 09/10/2019  . Alcohol dependence, uncomplicated (HCC) 04/28/2019  . Generalized anxiety disorder 04/28/2019  . Opioid dependence in remission (HCC) 04/28/2019    REFERRING DIAG: G62.9 (ICD-10-CM) - Neuropathy  THERAPY DIAG: Neuropathy, dropfoot L, weakness, gait disturbance   Rationale for Evaluation and Treatment Rehabilitation  PERTINENT HISTORY: None  PRECAUTIONS: None  SUBJECTIVE: Patient reports he fell since the initial evaluation. He had too much weight on the LLE when trying to pick something up and fell over. He has not been completing his HEP.   PAIN:  Are you having pain? Yes: NPRS scale: 8/10 Pain location: Lt calf and foot Pain description: numbness/tingling/stiffness Aggravating factors: prolonged sitting Relieving factors: walking   OBJECTIVE: (objective measures completed at initial  evaluation unless otherwise dated)  DIAGNOSTIC FINDINGS: NA   PATIENT SURVEYS:  LEFS 35/80   COGNITION:           Overall cognitive status: Within functional limits for tasks assessed                          SENSATION: WFL   EDEMA:  NA   MUSCLE LENGTH: Hamstrings: Right 45 deg; Left 52 deg   POSTURE:  forward flexed posture, lean to R side, L knee flexion   PALPATION: NA   LOWER EXTREMITY ROM:   Active ROM Right eval Left eval  Hip flexion      Hip extension      Hip abduction      Hip adduction      Hip internal rotation      Hip external rotation      Knee flexion 130 123  Knee extension 0 10  Ankle dorsiflexion      Ankle plantarflexion      Ankle inversion      Ankle eversion       (Blank rows = not tested)   LOWER EXTREMITY MMT:   MMT Right eval Left eval  Hip flexion 4+/5 4+/5  Hip extension      Hip abduction      Hip adduction      Hip internal rotation      Hip external rotation      Knee flexion 5/5 4-/5  Knee extension 5/5 3-/5  Ankle dorsiflexion 5/5 2/5  Ankle plantarflexion 5/5 3/5  Ankle inversion  Ankle eversion       (Blank rows = not tested)   LOWER EXTREMITY SPECIAL TESTS:  N/A   FUNCTIONAL TESTS:  Berg Balance Scale: 08/24/21 BERG BALANCE TEST Sitting to Standing: 4.      Stands without using hands and stabilize independently Standing Unsupported: 4.      Stands safely for 2 minutes Sitting Unsupported: 4.     Sits for 2 minutes independently Standing to Sitting: 4.     Sits safely with minimal use of hands Transfers: 3.     Transfers safely definite use of hands Standing with eyes closed: 3.     Stands 10 seconds with supervision Standing with feet together: 3.     Stands for 1 minute with supervision Reaching forward with outstretched arm: 3.     Reaches forward 5 inches Retrieving object from the floor: 3.     Able to pick up with supervision Turning to look behind: 1.     Needs supervision when turning Turning  360 degrees: 2.     Able to turn slowly, but safely Place alternate foot on stool: 0.     Unable, needs assist to keep from falling Standing with one foot in front: 0.     Loses balance while standing/stepping Standing on one foot: 0.     Unable  Total Score: 34/56    GAIT: Distance walked: 100 Assistive device utilized: Crutches one on L side Level of assistance: Modified independence Comments: L knee flexed, slow gait speed, step to gait pattern       TODAY'S TREATMENT: [facility] Adult PT Treatment:                                                DATE: 08/31/21 Therapeutic Exercise: LAQ LLE 2 x 10  Seated heel raises and toe raises 2 x 10  Mini squats x 5 Weight shifts lateral and forward/backward x 5 each  Reviewed HEP  Manual Therapy: *** Neuromuscular re-ed: *** Therapeutic Activity: *** Modalities: *** Self Care: ***  [facility] Adult PT Treatment:                                                DATE: 08/24/21 Therapeutic Exercise: LAQ LLE 2 x 10  Seated heel raises and toe raises 2 x 10  Mini squats x 5 Weight shifts lateral and forward/backward x 5 each  Reviewed HEP   Neuromuscular re-ed: Balance activity as part of BERG balance test   Self Care: Discussed appropriate wear of AFO.       PATIENT EDUCATION:  Education details: HEP Person educated: Patient Education method: Programmer, multimedia, demo, cues Education comprehension: verbalized understanding, returned demo, needs further instruction     HOME EXERCISE PROGRAM: Access Code: [email or web address] URL: [email or web address] Date: 08/12/2021 Prepared by: Gardiner Rhyme   Exercises - Seated Calf Stretch with Strap  - 2 x daily - 7 x weekly - 3 sets - 20-30 seconds hold - Mini Squat with Counter Support  - 1 x daily - 7 x weekly - 2 sets - 5-10 reps - Side to Side Weight Shift with Counter Support  - 1 x daily - 7 x weekly - 1-2 sets - 10 reps -  Staggered Stance Forward Backward Weight Shift with Counter Support   - 1 x daily - 7 x weekly - 1-2 sets - 10 reps   ASSESSMENT:   CLINICAL IMPRESSION: BERG balance assessment completed with patient scoring at an increased fall risk based upon his score. He has the most difficulty with tasks that require SLS on the LLE as he is unable to maintain his balance at this time.  Able to initiate LE strengthening focusing on the knee and ankle, which he tolerates well. He is able to minimally lift his toes on the LLE during seated toe raises secondary to weakness. Time spent reviewing HEP, educating patient on importance of complying with prescribed exercises with patient verbalizing understanding.      OBJECTIVE IMPAIRMENTS decreased activity tolerance, decreased balance, decreased mobility, difficulty walking, decreased ROM, decreased strength, increased fascial restrictions, impaired perceived functional ability, impaired flexibility, improper body mechanics, and pain.    ACTIVITY LIMITATIONS carrying, lifting, standing, squatting, stairs, bathing, and locomotion level   PARTICIPATION LIMITATIONS: meal prep, cleaning, laundry, and community activity   PERSONAL FACTORS Past/current experiences, Time since onset of injury/illness/exacerbation, Transportation, and 1 comorbidity: kidney disease  are also affecting patient's functional outcome.    REHAB POTENTIAL: Good   CLINICAL DECISION MAKING: Stable/uncomplicated   EVALUATION COMPLEXITY: Low     GOALS: Goals reviewed with patient? No   SHORT TERM GOALS: Target date: 09/02/2021  Pt will be independent with initial HEP. Baseline: prescribed at initial eval Goal status: INITIAL   2.  Assess Berg Balance Test and create LTG. Baseline:  Goal status: achieved      LONG TERM GOALS: Target date: 09/23/2021    Pt will be independent with advanced HEP for continued progression of strength and balance. Baseline: not provided yet Goal status: INITIAL   2.  Pt will increased LEFS to at least 45/80 or greater,  demonstrating improvement in functional mobility. Baseline: 35/80 Goal status: INITIAL   3.  Pt will increased L knee MMT to 4/5 for improved LE advancement and stability during gait. Baseline: 3-/5 to 4-/5 Goal status: INITIAL   4.  Pt will ambulate with step through gait pattern with no antalgic gait. Baseline: step to with antalgia Goal status: INITIAL    5.  Pt will score 39/56 on the BERG to signify a decrease in fall risk.  Baseline: see above  Goal status: INITIAL     PLAN: PT FREQUENCY: 1-2x/week   PT DURATION: 6 weeks   PLANNED INTERVENTIONS: Therapeutic exercises, Therapeutic activity, Neuromuscular re-education, Balance training, Gait training, Patient/Family education, Joint mobilization, Stair training, Dry Needling, Electrical stimulation, Cryotherapy, Moist heat, Taping, Vasopneumatic device, Ionotophoresis 4mg /ml Dexamethasone, Manual therapy, and Re-evaluation   PLAN FOR NEXT SESSION:  Assess response to HEP/update PRN, LE strengthening and flexibility, balance.  , PT, DPT, ATC 08/30/21 10:40 AM

## 2021-09-07 ENCOUNTER — Ambulatory Visit: Payer: Medicaid Other

## 2021-09-14 ENCOUNTER — Ambulatory Visit: Payer: Medicaid Other | Attending: Critical Care Medicine

## 2021-09-14 DIAGNOSIS — M6281 Muscle weakness (generalized): Secondary | ICD-10-CM | POA: Diagnosis present

## 2021-09-14 DIAGNOSIS — M79605 Pain in left leg: Secondary | ICD-10-CM | POA: Insufficient documentation

## 2021-09-14 DIAGNOSIS — R2689 Other abnormalities of gait and mobility: Secondary | ICD-10-CM | POA: Insufficient documentation

## 2021-09-14 NOTE — Therapy (Signed)
OUTPATIENT PHYSICAL THERAPY TREATMENT NOTE/RE-EVALUATION   Patient Name: Brett Butler MRN: [id number] DOB:10-15-1965, 56 y.o., male Today's Date: 09/15/2021  PCP: Storm Frisk, MD REFERRING PROVIDER: Storm Frisk, MD  END OF SESSION:   PT End of Session - 09/14/21 1533     Visit Number 3    Number of Visits 15    Date for PT Re-Evaluation 10/29/21    Authorization Type Medicaid- out of auth, requesting visits    PT Start Time 1533    PT Stop Time 1610    PT Time Calculation (min) 37 min    Equipment Utilized During Treatment --    Activity Tolerance Patient tolerated treatment well    Behavior During Therapy [facility] for tasks assessed/performed              Past Medical History:  Diagnosis Date   ARF (acute renal failure) (HCC) 09/28/2020   Hyperkalemia 09/29/2020   Past Surgical History:  Procedure Laterality Date   ANKLE SURGERY     IR FLUORO GUIDE CV LINE RIGHT  09/30/2020   IR FLUORO GUIDE CV LINE RIGHT  10/07/2020   IR REMOVAL TUN CV CATH W/O FL  10/22/2020   IR US GUIDE VASC ACCESS RIGHT  09/30/2020   IR US GUIDE VASC ACCESS RIGHT  10/07/2020   Patient Active Problem List   Diagnosis Date Noted   Tobacco abuse 08/02/2021   Carpal tunnel syndrome of right wrist 08/02/2021   Colon cancer screening 08/02/2021   Rhabdomyolysis 09/29/2020   History of cocaine use 09/29/2020   Injury of left sciatic nerve    Schizoaffective disorder, depressive type (HCC) 09/10/2019   Cannabis use disorder, moderate, dependence (HCC) 09/10/2019   PTSD (post-traumatic stress disorder) 09/10/2019   Alcohol dependence, uncomplicated (HCC) 04/28/2019   Generalized anxiety disorder 04/28/2019   Opioid dependence in remission (HCC) 04/28/2019    REFERRING DIAG: G62.9 (ICD-10-CM) - Neuropathy  THERAPY DIAG:  Pain in left leg  Muscle weakness (generalized)  Other abnormalities of gait and mobility  Rationale for Evaluation and Treatment Rehabilitation  PERTINENT HISTORY:  None  PRECAUTIONS: None  SUBJECTIVE: Patient reports he has not been completing his exercises as he had a death in the family. Patient continues to report pain in the Lt foot that is constant. He hasn't had any falls since last session, but reports a few instances of near falls due to LOB.   PAIN:  Are you having pain? Yes: NPRS scale: 6/10 Pain location: Lt foot Pain description: numbness/tingling/stiffness Aggravating factors: prolonged sitting, cold Relieving factors: walking   OBJECTIVE: (objective measures completed at initial evaluation unless otherwise dated)  DIAGNOSTIC FINDINGS: NA   PATIENT SURVEYS:  LEFS 35/80 09/14/21: 27/80   COGNITION:           Overall cognitive status: Within functional limits for tasks assessed                          SENSATION: WFL   EDEMA:  NA   MUSCLE LENGTH: Hamstrings: Right 45 deg; Left 52 deg   POSTURE:  forward flexed posture, lean to R side, L knee flexion   PALPATION: NA   LOWER EXTREMITY ROM:   Active ROM Right eval Left eval  Hip flexion      Hip extension      Hip abduction      Hip adduction      Hip internal rotation      Hip external  rotation      Knee flexion 130 123  Knee extension 0 10  Ankle dorsiflexion      Ankle plantarflexion      Ankle inversion      Ankle eversion       (Blank rows = not tested)   LOWER EXTREMITY MMT:   MMT Right eval Left eval 09/14/21  Hip flexion 4+/5 4+/5 Bilateral 5/5  Hip extension       Hip abduction       Hip adduction       Hip internal rotation       Hip external rotation       Knee flexion 5/5 4-/5 Lt: 4-/5  Knee extension 5/5 3-/5 Lt: 3+/5  Ankle dorsiflexion 5/5 2/5 Lt: 3-/5  Ankle plantarflexion 5/5 3/5 Lt: 3+/5 in sitting  Ankle inversion       Ankle eversion        (Blank rows = not tested)   LOWER EXTREMITY SPECIAL TESTS:  N/A   FUNCTIONAL TESTS:  Berg Balance Scale: 08/24/21 BERG BALANCE TEST Sitting to Standing: 4.      Stands without using  hands and stabilize independently Standing Unsupported: 4.      Stands safely for 2 minutes Sitting Unsupported: 4.     Sits for 2 minutes independently Standing to Sitting: 4.     Sits safely with minimal use of hands Transfers: 3.     Transfers safely definite use of hands Standing with eyes closed: 3.     Stands 10 seconds with supervision Standing with feet together: 3.     Stands for 1 minute with supervision Reaching forward with outstretched arm: 3.     Reaches forward 5 inches Retrieving object from the floor: 3.     Able to pick up with supervision Turning to look behind: 1.     Needs supervision when turning Turning 360 degrees: 2.     Able to turn slowly, but safely Place alternate foot on stool: 0.     Unable, needs assist to keep from falling Standing with one foot in front: 0.     Loses balance while standing/stepping Standing on one foot: 0.     Unable  Total Score: 34/56 BERG BALANCE TEST 09/14/21 Sitting to Standing: 4.      Stands without using hands and stabilize independently Standing Unsupported: 4.      Stands safely for 2 minutes Sitting Unsupported: 4.     Sits for 2 minutes independently Standing to Sitting: 4.     Sits safely with minimal use of hands Transfers: 3.     Transfers safely definite use of hands Standing with eyes closed: 3.     Stands 10 seconds with supervision Standing with feet together: 3.     Stands for 1 minute with supervision Reaching forward with outstretched arm: 0.     Loses balance/requires assistace Retrieving object from the floor: 3.     Able to pick up with supervision Turning to look behind: 2.     Turns sideways only, maintains balance Turning 360 degrees: 1.     Needs supervision or verbal cueing Place alternate foot on stool: 0.     Unable, needs assist to keep from falling Standing with one foot in front: 0.     Loses balance while standing/stepping Standing on one foot: 1.     Holds <3 seconds  Total Score: 32/56     GAIT: Distance walked: 100 Assistive  device utilized: Crutches one on L side Level of assistance: Modified independence Comments: L knee flexed, slow gait speed, step to gait pattern  09/14/21: 20 ft; SPC; Mod I; lateral trunk lean, slow gait speed, step to pattern        TODAY'S TREATMENT: [facility] Adult PT Treatment:                                                DATE: 09/14/21 Therapeutic Exercise: LAQ LLE 2 x 10  Seated ankle DF partial range 2 x 10  Resisted hamstring curl green band 1 x 10  Reviewed HEP   [facility] Adult PT Treatment:                                                DATE: 08/24/21 Therapeutic Exercise: LAQ LLE 2 x 10  Seated heel raises and toe raises 2 x 10  Mini squats x 5 Weight shifts lateral and forward/backward x 5 each  Reviewed HEP   Neuromuscular re-ed: Balance activity as part of BERG balance test   Self Care: Discussed appropriate wear of AFO.       PATIENT EDUCATION:  Education details: HEP and re-assessment findings  Person educated: Patient Education method: Explanation Education comprehension: verbalized understanding,      HOME EXERCISE PROGRAM: Access Code: [email or web address] URL: [email or web address] Date: 08/12/2021 Prepared by: Gardiner Rhyme   Exercises - Seated Calf Stretch with Strap  - 2 x daily - 7 x weekly - 3 sets - 20-30 seconds hold - Mini Squat with Counter Support  - 1 x daily - 7 x weekly - 2 sets - 5-10 reps - Side to Side Weight Shift with Counter Support  - 1 x daily - 7 x weekly - 1-2 sets - 10 reps - Staggered Stance Forward Backward Weight Shift with Counter Support  - 1 x daily - 7 x weekly - 1-2 sets - 10 reps   ASSESSMENT:   CLINICAL IMPRESSION: Patient has attended 1 PT session since the start of care on 08/12/21 demonstrating mild improvement in LLE strength and minor regression in balance as noted through BERG assessment and subjective functional abilities based on the LEFS. He will benefit from consistent  skilled PT 2 x week for 6 weeks to address his strength, balance, and gait impairments that are related to this LLE neuropathy in order to optimize his function and decrease his fall risk.    OBJECTIVE IMPAIRMENTS decreased activity tolerance, decreased balance, decreased mobility, difficulty walking, decreased ROM, decreased strength, increased fascial restrictions, impaired perceived functional ability, impaired flexibility, improper body mechanics, and pain.    ACTIVITY LIMITATIONS carrying, lifting, standing, squatting, stairs, bathing, and locomotion level   PARTICIPATION LIMITATIONS: meal prep, cleaning, laundry, and community activity   PERSONAL FACTORS Past/current experiences, Time since onset of injury/illness/exacerbation, Transportation, and 1 comorbidity: kidney disease  are also affecting patient's functional outcome.    REHAB POTENTIAL: Good   CLINICAL DECISION MAKING: Stable/uncomplicated   EVALUATION COMPLEXITY: Low     GOALS: Goals reviewed with patient? No   SHORT TERM GOALS: Target date: 09/02/2021  Pt will be independent with initial HEP. Baseline: prescribed at initial eval Goal status: ongoing    2.  Assess  Berg Balance Test and create LTG. Baseline:  Goal status: achieved      LONG TERM GOALS: Target date: 09/23/2021    Pt will be independent with advanced HEP for continued progression of strength and balance. Baseline: initial HEP issued  Goal status: ongoing    2.  Pt will increased LEFS to at least 45/80 or greater, demonstrating improvement in functional mobility. Baseline: 35/80; 09/14/21 27/80 Goal status: ongoing    3.  Pt will increased L knee MMT to 4/5 for improved LE advancement and stability during gait. Baseline: 3-/5 to 4-/5 Goal status: ongoing    4.  Pt will ambulate with step through gait pattern with LRAD for household distances. Baseline: step to with antalgia Goal status: revised     5.  Pt will score 39/56 on the BERG to signify  a decrease in fall risk.  Baseline: see above  Goal status: ongoing      PLAN: PT FREQUENCY: 2x week    PT DURATION: 6 weeks   PLANNED INTERVENTIONS: Therapeutic exercises, Therapeutic activity, Neuromuscular re-education, Balance training, Gait training, Patient/Family education, Joint mobilization, Stair training, Dry Needling, Electrical stimulation, Cryotherapy, Moist heat, Taping, Vasopneumatic device, Ionotophoresis 4mg /ml Dexamethasone, Manual therapy, and Re-evaluation   PLAN FOR NEXT SESSION:  Assess response to HEP/update PRN, LE strengthening and flexibility, balance.  , PT, DPT, ATC 09/15/21 8:33 AM

## 2021-09-21 ENCOUNTER — Ambulatory Visit: Payer: Medicaid Other

## 2021-09-21 DIAGNOSIS — M79605 Pain in left leg: Secondary | ICD-10-CM | POA: Diagnosis not present

## 2021-09-21 DIAGNOSIS — R2689 Other abnormalities of gait and mobility: Secondary | ICD-10-CM

## 2021-09-21 DIAGNOSIS — M6281 Muscle weakness (generalized): Secondary | ICD-10-CM

## 2021-09-21 NOTE — Therapy (Signed)
OUTPATIENT PHYSICAL THERAPY TREATMENT NOTE   Patient Name: Brett Butler MRN: [id number] DOB:24-Oct-1965, 56 y.o., male Today's Date: 09/21/2021  PCP: Storm Frisk, MD REFERRING PROVIDER: Storm Frisk, MD  END OF SESSION:   PT End of Session - 09/21/21 1532     Visit Number 4    Number of Visits 15    Date for PT Re-Evaluation 10/29/21    Authorization Type Medicaid    Authorization Time Period 8/4-9/14/23    Authorization - Visit Number 1    Authorization - Number of Visits 6    PT Start Time 1532    PT Stop Time 1613    PT Time Calculation (min) 41 min    Activity Tolerance Patient tolerated treatment well    Behavior During Therapy [facility] for tasks assessed/performed              Past Medical History:  Diagnosis Date   ARF (acute renal failure) (HCC) 09/28/2020   Hyperkalemia 09/29/2020   Past Surgical History:  Procedure Laterality Date   ANKLE SURGERY     IR FLUORO GUIDE CV LINE RIGHT  09/30/2020   IR FLUORO GUIDE CV LINE RIGHT  10/07/2020   IR REMOVAL TUN CV CATH W/O FL  10/22/2020   IR US GUIDE VASC ACCESS RIGHT  09/30/2020   IR US GUIDE VASC ACCESS RIGHT  10/07/2020   Patient Active Problem List   Diagnosis Date Noted   Tobacco abuse 08/02/2021   Carpal tunnel syndrome of right wrist 08/02/2021   Colon cancer screening 08/02/2021   Rhabdomyolysis 09/29/2020   History of cocaine use 09/29/2020   Injury of left sciatic nerve    Schizoaffective disorder, depressive type (HCC) 09/10/2019   Cannabis use disorder, moderate, dependence (HCC) 09/10/2019   PTSD (post-traumatic stress disorder) 09/10/2019   Alcohol dependence, uncomplicated (HCC) 04/28/2019   Generalized anxiety disorder 04/28/2019   Opioid dependence in remission (HCC) 04/28/2019    REFERRING DIAG: G62.9 (ICD-10-CM) - Neuropathy  THERAPY DIAG:  Pain in left leg  Muscle weakness (generalized)  Other abnormalities of gait and mobility  Rationale for Evaluation and Treatment  Rehabilitation  PERTINENT HISTORY: None  PRECAUTIONS: None  SUBJECTIVE: Patient reports increased pain about the Lt foot and lower leg. He reports compliance with HEP. No falls since last session.   PAIN:  Are you having pain? Yes: NPRS scale: 8/10 Pain location: Lt foot and calf Pain description: numbness/tingling/stiffness Aggravating factors: prolonged sitting, cold Relieving factors: walking   OBJECTIVE: (objective measures completed at initial evaluation unless otherwise dated)  DIAGNOSTIC FINDINGS: NA   PATIENT SURVEYS:  LEFS 35/80 09/14/21: 27/80   COGNITION:           Overall cognitive status: Within functional limits for tasks assessed                          SENSATION: WFL   EDEMA:  NA   MUSCLE LENGTH: Hamstrings: Right 45 deg; Left 52 deg   POSTURE:  forward flexed posture, lean to R side, L knee flexion   PALPATION: NA   LOWER EXTREMITY ROM:   Active ROM Right eval Left eval  Hip flexion      Hip extension      Hip abduction      Hip adduction      Hip internal rotation      Hip external rotation      Knee flexion 130 123  Knee extension 0  10  Ankle dorsiflexion      Ankle plantarflexion      Ankle inversion      Ankle eversion       (Blank rows = not tested)   LOWER EXTREMITY MMT:   MMT Right eval Left eval 09/14/21  Hip flexion 4+/5 4+/5 Bilateral 5/5  Hip extension       Hip abduction       Hip adduction       Hip internal rotation       Hip external rotation       Knee flexion 5/5 4-/5 Lt: 4-/5  Knee extension 5/5 3-/5 Lt: 3+/5  Ankle dorsiflexion 5/5 2/5 Lt: 3-/5  Ankle plantarflexion 5/5 3/5 Lt: 3+/5 in sitting  Ankle inversion       Ankle eversion        (Blank rows = not tested)   LOWER EXTREMITY SPECIAL TESTS:  N/A   FUNCTIONAL TESTS:  Berg Balance Scale: 08/24/21 BERG BALANCE TEST Sitting to Standing: 4.      Stands without using hands and stabilize independently Standing Unsupported: 4.      Stands safely for 2  minutes Sitting Unsupported: 4.     Sits for 2 minutes independently Standing to Sitting: 4.     Sits safely with minimal use of hands Transfers: 3.     Transfers safely definite use of hands Standing with eyes closed: 3.     Stands 10 seconds with supervision Standing with feet together: 3.     Stands for 1 minute with supervision Reaching forward with outstretched arm: 3.     Reaches forward 5 inches Retrieving object from the floor: 3.     Able to pick up with supervision Turning to look behind: 1.     Needs supervision when turning Turning 360 degrees: 2.     Able to turn slowly, but safely Place alternate foot on stool: 0.     Unable, needs assist to keep from falling Standing with one foot in front: 0.     Loses balance while standing/stepping Standing on one foot: 0.     Unable  Total Score: 34/56 BERG BALANCE TEST 09/14/21 Sitting to Standing: 4.      Stands without using hands and stabilize independently Standing Unsupported: 4.      Stands safely for 2 minutes Sitting Unsupported: 4.     Sits for 2 minutes independently Standing to Sitting: 4.     Sits safely with minimal use of hands Transfers: 3.     Transfers safely definite use of hands Standing with eyes closed: 3.     Stands 10 seconds with supervision Standing with feet together: 3.     Stands for 1 minute with supervision Reaching forward with outstretched arm: 0.     Loses balance/requires assistace Retrieving object from the floor: 3.     Able to pick up with supervision Turning to look behind: 2.     Turns sideways only, maintains balance Turning 360 degrees: 1.     Needs supervision or verbal cueing Place alternate foot on stool: 0.     Unable, needs assist to keep from falling Standing with one foot in front: 0.     Loses balance while standing/stepping Standing on one foot: 1.     Holds <3 seconds  Total Score: 32/56    GAIT: Distance walked: 100 Assistive device utilized: Crutches one on L side Level of  assistance: Modified independence Comments: L  knee flexed, slow gait speed, step to gait pattern  09/14/21: 20 ft; SPC; Mod I; lateral trunk lean, slow gait speed, step to pattern        TODAY'S TREATMENT: [facility] Adult PT Treatment:                                                DATE: 09/21/21 Therapeutic Exercise: NuStep level 4 x 5 minutes UE/LE Hip bridge 2 x 10  SLR 2 x 10  Hooklying resisted hip abduction green band 2 x 10  Resisted plantarflexion green band 2 x 10  Seated dorsiflexion 2 x 10  Updated HEP  [facility] Adult PT Treatment:                                                DATE: 09/14/21 Therapeutic Exercise: LAQ LLE 2 x 10  Seated ankle DF partial range 2 x 10  Resisted hamstring curl green band 1 x 10  Reviewed HEP   [facility] Adult PT Treatment:                                                DATE: 08/24/21 Therapeutic Exercise: LAQ LLE 2 x 10  Seated heel raises and toe raises 2 x 10  Mini squats x 5 Weight shifts lateral and forward/backward x 5 each  Reviewed HEP   Neuromuscular re-ed: Balance activity as part of BERG balance test   Self Care: Discussed appropriate wear of AFO.       PATIENT EDUCATION:  Education details: HEP, discussed desensitization  Person educated: Patient Education method: Explanation, demo, cues, handout  Education comprehension: verbalized understanding, returned demo, cues      HOME EXERCISE PROGRAM: Access Code: [email or web address] URL: [email or web address] Date: 08/12/2021 Prepared by: Gardiner Rhyme   Exercises - Seated Calf Stretch with Strap  - 2 x daily - 7 x weekly - 3 sets - 20-30 seconds hold - Mini Squat with Counter Support  - 1 x daily - 7 x weekly - 2 sets - 5-10 reps - Side to Side Weight Shift with Counter Support  - 1 x daily - 7 x weekly - 1-2 sets - 10 reps - Staggered Stance Forward Backward Weight Shift with Counter Support  - 1 x daily - 7 x weekly - 1-2 sets - 10 reps   ASSESSMENT:   CLINICAL  IMPRESSION: Patient tolerated session well today with focus on gross LE strengthening. He has notable weakness throughout the LLE with visible shaking in musculature with majority of prescribed exercises. He required consistent cues to breathe during exercises as he has tendency to hold his breath. Patient is hypersensitive to light touch about the calf and foot, so was recommended to begin desensitization to the area with patient verbalizing understanding.    OBJECTIVE IMPAIRMENTS decreased activity tolerance, decreased balance, decreased mobility, difficulty walking, decreased ROM, decreased strength, increased fascial restrictions, impaired perceived functional ability, impaired flexibility, improper body mechanics, and pain.    ACTIVITY LIMITATIONS carrying, lifting, standing, squatting, stairs, bathing, and locomotion level   PARTICIPATION LIMITATIONS: meal prep, cleaning, laundry,  and community activity   PERSONAL FACTORS Past/current experiences, Time since onset of injury/illness/exacerbation, Transportation, and 1 comorbidity: kidney disease  are also affecting patient's functional outcome.    REHAB POTENTIAL: Good   CLINICAL DECISION MAKING: Stable/uncomplicated   EVALUATION COMPLEXITY: Low     GOALS: Goals reviewed with patient? No   SHORT TERM GOALS: Target date: 09/02/2021  Pt will be independent with initial HEP. Baseline: prescribed at initial eval Goal status: ongoing    2.  Assess Berg Balance Test and create LTG. Baseline:  Goal status: achieved      LONG TERM GOALS: Target date: 09/23/2021    Pt will be independent with advanced HEP for continued progression of strength and balance. Baseline: initial HEP issued  Goal status: ongoing    2.  Pt will increased LEFS to at least 45/80 or greater, demonstrating improvement in functional mobility. Baseline: 35/80; 09/14/21 27/80 Goal status: ongoing    3.  Pt will increased L knee MMT to 4/5 for improved LE  advancement and stability during gait. Baseline: 3-/5 to 4-/5 Goal status: ongoing    4.  Pt will ambulate with step through gait pattern with LRAD for household distances. Baseline: step to with antalgia Goal status: revised     5.  Pt will score 39/56 on the BERG to signify a decrease in fall risk.  Baseline: see above  Goal status: ongoing      PLAN: PT FREQUENCY: 2x week    PT DURATION: 6 weeks   PLANNED INTERVENTIONS: Therapeutic exercises, Therapeutic activity, Neuromuscular re-education, Balance training, Gait training, Patient/Family education, Joint mobilization, Stair training, Dry Needling, Electrical stimulation, Cryotherapy, Moist heat, Taping, Vasopneumatic device, Ionotophoresis 4mg /ml Dexamethasone, Manual therapy, and Re-evaluation   PLAN FOR NEXT SESSION:  Assess response to HEP/update PRN, LE strengthening and flexibility, balance.  , PT, DPT, ATC 09/21/21 4:15 PM

## 2021-09-28 ENCOUNTER — Ambulatory Visit: Payer: Medicaid Other

## 2021-09-28 DIAGNOSIS — R2689 Other abnormalities of gait and mobility: Secondary | ICD-10-CM

## 2021-09-28 DIAGNOSIS — M79605 Pain in left leg: Secondary | ICD-10-CM

## 2021-09-28 DIAGNOSIS — M6281 Muscle weakness (generalized): Secondary | ICD-10-CM

## 2021-09-28 NOTE — Therapy (Signed)
OUTPATIENT PHYSICAL THERAPY TREATMENT NOTE   Patient Name: Brett Butler MRN: [id number] DOB:Feb 23, 1965, 56 y.o., male Today's Date: 09/28/2021  PCP: Storm Frisk, MD REFERRING PROVIDER: Storm Frisk, MD  END OF SESSION:   PT End of Session - 09/28/21 1531     Visit Number 5    Number of Visits 15    Date for PT Re-Evaluation 10/29/21    Authorization Type Medicaid    Authorization Time Period 8/4-9/14/23    Authorization - Visit Number 2    Authorization - Number of Visits 6    PT Start Time 1531    PT Stop Time 1614    PT Time Calculation (min) 43 min    Equipment Utilized During Treatment Other (comment)   SPC and single axillary crutch   Activity Tolerance Patient tolerated treatment well    Behavior During Therapy WFL for tasks assessed/performed              Past Medical History:  Diagnosis Date   ARF (acute renal failure) (HCC) 09/28/2020   Hyperkalemia 09/29/2020   Past Surgical History:  Procedure Laterality Date   ANKLE SURGERY     IR FLUORO GUIDE CV LINE RIGHT  09/30/2020   IR FLUORO GUIDE CV LINE RIGHT  10/07/2020   IR REMOVAL TUN CV CATH W/O FL  10/22/2020   IR US GUIDE VASC ACCESS RIGHT  09/30/2020   IR US GUIDE VASC ACCESS RIGHT  10/07/2020   Patient Active Problem List   Diagnosis Date Noted   Tobacco abuse 08/02/2021   Carpal tunnel syndrome of right wrist 08/02/2021   Colon cancer screening 08/02/2021   Rhabdomyolysis 09/29/2020   History of cocaine use 09/29/2020   Injury of left sciatic nerve    Schizoaffective disorder, depressive type (HCC) 09/10/2019   Cannabis use disorder, moderate, dependence (HCC) 09/10/2019   PTSD (post-traumatic stress disorder) 09/10/2019   Alcohol dependence, uncomplicated (HCC) 04/28/2019   Generalized anxiety disorder 04/28/2019   Opioid dependence in remission (HCC) 04/28/2019    REFERRING DIAG: G62.9 (ICD-10-CM) - Neuropathy  THERAPY DIAG:  Pain in left leg  Muscle weakness  (generalized)  Other abnormalities of gait and mobility  Rationale for Evaluation and Treatment Rehabilitation  PERTINENT HISTORY: None  PRECAUTIONS: None  SUBJECTIVE: Patient reports feeling a lot of stiffness in the foot and calf currently. He reports compliance with HEP. No falls since last session.   PAIN:  Are you having pain? Yes: NPRS scale: 8/10 Pain location: Lt foot and calf Pain description: numbness/tingling/stiffness Aggravating factors: prolonged sitting, cold Relieving factors: walking   OBJECTIVE: (objective measures completed at initial evaluation unless otherwise dated)  DIAGNOSTIC FINDINGS: NA   PATIENT SURVEYS:  LEFS 35/80 09/14/21: 27/80   COGNITION:           Overall cognitive status: Within functional limits for tasks assessed                          SENSATION: WFL   EDEMA:  NA   MUSCLE LENGTH: Hamstrings: Right 45 deg; Left 52 deg   POSTURE:  forward flexed posture, lean to R side, L knee flexion   PALPATION: NA   LOWER EXTREMITY ROM:   Active ROM Right eval Left eval  Hip flexion      Hip extension      Hip abduction      Hip adduction      Hip internal rotation  Hip external rotation      Knee flexion 130 123  Knee extension 0 10  Ankle dorsiflexion      Ankle plantarflexion      Ankle inversion      Ankle eversion       (Blank rows = not tested)   LOWER EXTREMITY MMT:   MMT Right eval Left eval 09/14/21  Hip flexion 4+/5 4+/5 Bilateral 5/5  Hip extension       Hip abduction       Hip adduction       Hip internal rotation       Hip external rotation       Knee flexion 5/5 4-/5 Lt: 4-/5  Knee extension 5/5 3-/5 Lt: 3+/5  Ankle dorsiflexion 5/5 2/5 Lt: 3-/5  Ankle plantarflexion 5/5 3/5 Lt: 3+/5 in sitting  Ankle inversion       Ankle eversion        (Blank rows = not tested)   LOWER EXTREMITY SPECIAL TESTS:  N/A   FUNCTIONAL TESTS:  Berg Balance Scale: 08/24/21 BERG BALANCE TEST Sitting to Standing: 4.       Stands without using hands and stabilize independently Standing Unsupported: 4.      Stands safely for 2 minutes Sitting Unsupported: 4.     Sits for 2 minutes independently Standing to Sitting: 4.     Sits safely with minimal use of hands Transfers: 3.     Transfers safely definite use of hands Standing with eyes closed: 3.     Stands 10 seconds with supervision Standing with feet together: 3.     Stands for 1 minute with supervision Reaching forward with outstretched arm: 3.     Reaches forward 5 inches Retrieving object from the floor: 3.     Able to pick up with supervision Turning to look behind: 1.     Needs supervision when turning Turning 360 degrees: 2.     Able to turn slowly, but safely Place alternate foot on stool: 0.     Unable, needs assist to keep from falling Standing with one foot in front: 0.     Loses balance while standing/stepping Standing on one foot: 0.     Unable  Total Score: 34/56 BERG BALANCE TEST 09/14/21 Sitting to Standing: 4.      Stands without using hands and stabilize independently Standing Unsupported: 4.      Stands safely for 2 minutes Sitting Unsupported: 4.     Sits for 2 minutes independently Standing to Sitting: 4.     Sits safely with minimal use of hands Transfers: 3.     Transfers safely definite use of hands Standing with eyes closed: 3.     Stands 10 seconds with supervision Standing with feet together: 3.     Stands for 1 minute with supervision Reaching forward with outstretched arm: 0.     Loses balance/requires assistace Retrieving object from the floor: 3.     Able to pick up with supervision Turning to look behind: 2.     Turns sideways only, maintains balance Turning 360 degrees: 1.     Needs supervision or verbal cueing Place alternate foot on stool: 0.     Unable, needs assist to keep from falling Standing with one foot in front: 0.     Loses balance while standing/stepping Standing on one foot: 1.     Holds <3 seconds  Total  Score: 32/56    GAIT: Distance walked:  100 Assistive device utilized: Crutches one on L side Level of assistance: Modified independence Comments: L knee flexed, slow gait speed, step to gait pattern  09/14/21: 20 ft; SPC; Mod I; lateral trunk lean, slow gait speed, step to pattern        TODAY'S TREATMENT: [facility] Adult PT Treatment:                                                DATE: 09/28/21 Therapeutic Exercise: NuStep level 6 x 5 minutes UE/LE Ankle DF partial range 2 x 10  Attempted ankle PF, inversion, eversion minimal activation in gravity dependent positioning  Standing HS curl 2 x 10  Manual Therapy: Lt Passive gastroc stretch  Lt Ankle mobilizations to improve DF Lt ankle PROM in all planes   Gait training: With SPC in Rt hand focusing on appropriate sequencing, cane placement, step through pattern With single axillary crutch in RUE focusing on appropriate sequencing, crutch placement, step through pattern    [facility] Adult PT Treatment:                                                DATE: 09/21/21 Therapeutic Exercise: NuStep level 4 x 5 minutes UE/LE Hip bridge 2 x 10  SLR 2 x 10  Hooklying resisted hip abduction green band 2 x 10  Resisted plantarflexion green band 2 x 10  Seated dorsiflexion 2 x 10  Updated HEP  [facility] Adult PT Treatment:                                                DATE: 09/14/21 Therapeutic Exercise: LAQ LLE 2 x 10  Seated ankle DF partial range 2 x 10  Resisted hamstring curl green band 1 x 10  Reviewed HEP        PATIENT EDUCATION:  Education details: recommended to utilize single axillary crutch for ambulation  Person educated: Patient Education method: Explanation Education comprehension: verbalized understanding     HOME EXERCISE PROGRAM: Access Code: [email or web address] URL: [email or web address] Date: 08/12/2021 Prepared by: Gardiner Rhyme   Exercises - Seated Calf Stretch with Strap  - 2 x daily - 7 x weekly - 3 sets - 20-30  seconds hold - Mini Squat with Counter Support  - 1 x daily - 7 x weekly - 2 sets - 5-10 reps - Side to Side Weight Shift with Counter Support  - 1 x daily - 7 x weekly - 1-2 sets - 10 reps - Staggered Stance Forward Backward Weight Shift with Counter Support  - 1 x daily - 7 x weekly - 1-2 sets - 10 reps   ASSESSMENT:   CLINICAL IMPRESSION: Patient tolerated session well today focusing on gait training with both a SPC and single axillary crutch in the RUE to improve his overall gait mechanics and stability. With the [facility] in the Rt hand he has better trunk and LE alignment compared to his positioning when he uses the [facility] in his Lt hand, but he places a lot of weight through the wrist and has difficulty keeping cane away from midline.  With the single axillary crutch he has good trunk and LE alignment as well as improved stability and control of the device compared to the [facility] and reported a reduction in LE pain. It was recommended that he begin to utilize his single axillary crutch for ambulation with patient verbalizing understanding.    OBJECTIVE IMPAIRMENTS decreased activity tolerance, decreased balance, decreased mobility, difficulty walking, decreased ROM, decreased strength, increased fascial restrictions, impaired perceived functional ability, impaired flexibility, improper body mechanics, and pain.    ACTIVITY LIMITATIONS carrying, lifting, standing, squatting, stairs, bathing, and locomotion level   PARTICIPATION LIMITATIONS: meal prep, cleaning, laundry, and community activity   PERSONAL FACTORS Past/current experiences, Time since onset of injury/illness/exacerbation, Transportation, and 1 comorbidity: kidney disease  are also affecting patient's functional outcome.    REHAB POTENTIAL: Good   CLINICAL DECISION MAKING: Stable/uncomplicated   EVALUATION COMPLEXITY: Low     GOALS: Goals reviewed with patient? No   SHORT TERM GOALS: Target date: 09/02/2021  Pt will be independent  with initial HEP. Baseline: prescribed at initial eval Goal status: achieved     2.  Assess Berg Balance Test and create LTG. Baseline:  Goal status: achieved      LONG TERM GOALS: Target date: 09/23/2021    Pt will be independent with advanced HEP for continued progression of strength and balance. Baseline: initial HEP issued  Goal status: ongoing    2.  Pt will increased LEFS to at least 45/80 or greater, demonstrating improvement in functional mobility. Baseline: 35/80; 09/14/21 27/80 Goal status: ongoing    3.  Pt will increased L knee MMT to 4/5 for improved LE advancement and stability during gait. Baseline: 3-/5 to 4-/5 Goal status: ongoing    4.  Pt will ambulate with step through gait pattern with LRAD for household distances. Baseline: step to with antalgia Goal status: revised     5.  Pt will score 39/56 on the BERG to signify a decrease in fall risk.  Baseline: see above  Goal status: ongoing      PLAN: PT FREQUENCY: 2x week    PT DURATION: 6 weeks   PLANNED INTERVENTIONS: Therapeutic exercises, Therapeutic activity, Neuromuscular re-education, Balance training, Gait training, Patient/Family education, Joint mobilization, Stair training, Dry Needling, Electrical stimulation, Cryotherapy, Moist heat, Taping, Vasopneumatic device, Ionotophoresis 4mg /ml Dexamethasone, Manual therapy, and Re-evaluation   PLAN FOR NEXT SESSION:  Assess response to HEP/update PRN, LE strengthening and flexibility, balance.  , PT, DPT, ATC 09/28/21 4:17 PM

## 2021-10-06 ENCOUNTER — Ambulatory Visit: Payer: Medicaid Other

## 2021-10-06 DIAGNOSIS — M79605 Pain in left leg: Secondary | ICD-10-CM

## 2021-10-06 DIAGNOSIS — M6281 Muscle weakness (generalized): Secondary | ICD-10-CM

## 2021-10-06 DIAGNOSIS — R2689 Other abnormalities of gait and mobility: Secondary | ICD-10-CM

## 2021-10-06 NOTE — Therapy (Signed)
OUTPATIENT PHYSICAL THERAPY TREATMENT NOTE   Patient Name: Brett Butler MRN: [id number] DOB:Jan 01, 1966, 56 y.o., male Today's Date: 10/06/2021  PCP: Storm Frisk, MD REFERRING PROVIDER: Storm Frisk, MD  END OF SESSION:   PT End of Session - 10/06/21 1534     Visit Number 6    Number of Visits 15    Date for PT Re-Evaluation 10/29/21    Authorization Type Medicaid    Authorization Time Period 8/4-9/14/23    Authorization - Visit Number 3    Authorization - Number of Visits 6    PT Start Time 1533    PT Stop Time 1615    PT Time Calculation (min) 42 min    Equipment Utilized During Treatment --    Activity Tolerance Patient tolerated treatment well    Behavior During Therapy [facility] for tasks assessed/performed               Past Medical History:  Diagnosis Date   ARF (acute renal failure) (HCC) 09/28/2020   Hyperkalemia 09/29/2020   Past Surgical History:  Procedure Laterality Date   ANKLE SURGERY     IR FLUORO GUIDE CV LINE RIGHT  09/30/2020   IR FLUORO GUIDE CV LINE RIGHT  10/07/2020   IR REMOVAL TUN CV CATH W/O FL  10/22/2020   IR US GUIDE VASC ACCESS RIGHT  09/30/2020   IR US GUIDE VASC ACCESS RIGHT  10/07/2020   Patient Active Problem List   Diagnosis Date Noted   Tobacco abuse 08/02/2021   Carpal tunnel syndrome of right wrist 08/02/2021   Colon cancer screening 08/02/2021   Rhabdomyolysis 09/29/2020   History of cocaine use 09/29/2020   Injury of left sciatic nerve    Schizoaffective disorder, depressive type (HCC) 09/10/2019   Cannabis use disorder, moderate, dependence (HCC) 09/10/2019   PTSD (post-traumatic stress disorder) 09/10/2019   Alcohol dependence, uncomplicated (HCC) 04/28/2019   Generalized anxiety disorder 04/28/2019   Opioid dependence in remission (HCC) 04/28/2019    REFERRING DIAG: G62.9 (ICD-10-CM) - Neuropathy  THERAPY DIAG:  Pain in left leg  Muscle weakness (generalized)  Other abnormalities of gait and  mobility  Rationale for Evaluation and Treatment Rehabilitation  PERTINENT HISTORY: None  PRECAUTIONS: None  SUBJECTIVE:  Patient found his crutch today and started walking with it. He reports continued lower leg and foot pain. He reports intermittent compliance with HEP.  PAIN:  Are you having pain? Yes: NPRS scale: 10/10 Pain location: Lt foot and calf Pain description: numbness/tingling/stiffness Aggravating factors: prolonged sitting, cold Relieving factors: walking   OBJECTIVE: (objective measures completed at initial evaluation unless otherwise dated)  DIAGNOSTIC FINDINGS: NA   PATIENT SURVEYS:  LEFS 35/80 09/14/21: 27/80   COGNITION:           Overall cognitive status: Within functional limits for tasks assessed                          SENSATION: WFL   EDEMA:  NA   MUSCLE LENGTH: Hamstrings: Right 45 deg; Left 52 deg   POSTURE:  forward flexed posture, lean to R side, L knee flexion   PALPATION: NA   LOWER EXTREMITY ROM:   Active ROM Right eval Left eval  Hip flexion      Hip extension      Hip abduction      Hip adduction      Hip internal rotation      Hip external rotation  Knee flexion 130 123  Knee extension 0 10  Ankle dorsiflexion      Ankle plantarflexion      Ankle inversion      Ankle eversion       (Blank rows = not tested)   LOWER EXTREMITY MMT:   MMT Right eval Left eval 09/14/21 10/06/21  Hip flexion 4+/5 4+/5 Bilateral 5/5   Hip extension        Hip abduction        Hip adduction        Hip internal rotation        Hip external rotation        Knee flexion 5/5 4-/5 Lt: 4-/5   Knee extension 5/5 3-/5 Lt: 3+/5   Ankle dorsiflexion 5/5 2/5 Lt: 3-/5   Ankle plantarflexion 5/5 3/5 Lt: 3+/5 in sitting Able to complete partial range of SL calf raise on the LLE  Ankle inversion        Ankle eversion         (Blank rows = not tested)   LOWER EXTREMITY SPECIAL TESTS:  N/A   FUNCTIONAL TESTS:  Berg Balance Scale:  08/24/21 BERG BALANCE TEST Sitting to Standing: 4.      Stands without using hands and stabilize independently Standing Unsupported: 4.      Stands safely for 2 minutes Sitting Unsupported: 4.     Sits for 2 minutes independently Standing to Sitting: 4.     Sits safely with minimal use of hands Transfers: 3.     Transfers safely definite use of hands Standing with eyes closed: 3.     Stands 10 seconds with supervision Standing with feet together: 3.     Stands for 1 minute with supervision Reaching forward with outstretched arm: 3.     Reaches forward 5 inches Retrieving object from the floor: 3.     Able to pick up with supervision Turning to look behind: 1.     Needs supervision when turning Turning 360 degrees: 2.     Able to turn slowly, but safely Place alternate foot on stool: 0.     Unable, needs assist to keep from falling Standing with one foot in front: 0.     Loses balance while standing/stepping Standing on one foot: 0.     Unable  Total Score: 34/56 BERG BALANCE TEST 09/14/21 Sitting to Standing: 4.      Stands without using hands and stabilize independently Standing Unsupported: 4.      Stands safely for 2 minutes Sitting Unsupported: 4.     Sits for 2 minutes independently Standing to Sitting: 4.     Sits safely with minimal use of hands Transfers: 3.     Transfers safely definite use of hands Standing with eyes closed: 3.     Stands 10 seconds with supervision Standing with feet together: 3.     Stands for 1 minute with supervision Reaching forward with outstretched arm: 0.     Loses balance/requires assistace Retrieving object from the floor: 3.     Able to pick up with supervision Turning to look behind: 2.     Turns sideways only, maintains balance Turning 360 degrees: 1.     Needs supervision or verbal cueing Place alternate foot on stool: 0.     Unable, needs assist to keep from falling Standing with one foot in front: 0.     Loses balance while  standing/stepping Standing on one foot: 1.  Holds <3 seconds  Total Score: 32/56    GAIT: Distance walked: 100 Assistive device utilized: Crutches one on L side Level of assistance: Modified independence Comments: L knee flexed, slow gait speed, step to gait pattern  09/14/21: 20 ft; SPC; Mod I; lateral trunk lean, slow gait speed, step to pattern        TODAY'S TREATMENT: [facility] Adult PT Treatment:                                                DATE: 10/06/21 Therapeutic Exercise: strengthening in // bars  NuStep level 6 x 5 minutes  Standing march  2 x 10 Standing calf raise 2 x 10 SL calf raise on LLE x 3; partial range Sit to stand x 5 HS curl 2 x 10  HEP updated   [facility] Adult PT Treatment:                                                DATE: 09/28/21 Therapeutic Exercise: NuStep level 6 x 5 minutes UE/LE Ankle DF partial range 2 x 10  Attempted ankle PF, inversion, eversion minimal activation in gravity dependent positioning  Standing HS curl 2 x 10  Manual Therapy: Lt Passive gastroc stretch  Lt Ankle mobilizations to improve DF Lt ankle PROM in all planes   Gait training: With SPC in Rt hand focusing on appropriate sequencing, cane placement, step through pattern With single axillary crutch in RUE focusing on appropriate sequencing, crutch placement, step through pattern    [facility] Adult PT Treatment:                                                DATE: 09/21/21 Therapeutic Exercise: NuStep level 4 x 5 minutes UE/LE Hip bridge 2 x 10  SLR 2 x 10  Hooklying resisted hip abduction green band 2 x 10  Resisted plantarflexion green band 2 x 10  Seated dorsiflexion 2 x 10  Updated HEP       PATIENT EDUCATION:  Education details: HEP Person educated: Patient Education method: Programmer, multimedia, demo, cues, handout Education comprehension: verbalized understanding, returned demo, cues      HOME EXERCISE PROGRAM: Access Code: [email or web address] URL:  [email or web address] Date: 08/12/2021 Prepared by: Gardiner Rhyme   Exercises - Seated Calf Stretch with Strap  - 2 x daily - 7 x weekly - 3 sets - 20-30 seconds hold - Mini Squat with Counter Support  - 1 x daily - 7 x weekly - 2 sets - 5-10 reps - Side to Side Weight Shift with Counter Support  - 1 x daily - 7 x weekly - 1-2 sets - 10 reps - Staggered Stance Forward Backward Weight Shift with Counter Support  - 1 x daily - 7 x weekly - 1-2 sets - 10 reps   ASSESSMENT:   CLINICAL IMPRESSION: Patient continues to report high pain levels about the Lt lower leg/foot, though tolerates there ex well with no obvious signs of distress. Able to progress standing strengthening with patient requiring UE support for majority of exercises due to balance deficits  on the LLE. He is able to perform SL calf raise through partial range on the LLE. He has extreme difficulty controlling descent of sit to stand.    OBJECTIVE IMPAIRMENTS decreased activity tolerance, decreased balance, decreased mobility, difficulty walking, decreased ROM, decreased strength, increased fascial restrictions, impaired perceived functional ability, impaired flexibility, improper body mechanics, and pain.    ACTIVITY LIMITATIONS carrying, lifting, standing, squatting, stairs, bathing, and locomotion level   PARTICIPATION LIMITATIONS: meal prep, cleaning, laundry, and community activity   PERSONAL FACTORS Past/current experiences, Time since onset of injury/illness/exacerbation, Transportation, and 1 comorbidity: kidney disease  are also affecting patient's functional outcome.    REHAB POTENTIAL: Good   CLINICAL DECISION MAKING: Stable/uncomplicated   EVALUATION COMPLEXITY: Low     GOALS: Goals reviewed with patient? No   SHORT TERM GOALS: Target date: 09/02/2021  Pt will be independent with initial HEP. Baseline: prescribed at initial eval Goal status: achieved     2.  Assess Berg Balance Test and create  LTG. Baseline:  Goal status: achieved      LONG TERM GOALS: Target date: 09/23/2021    Pt will be independent with advanced HEP for continued progression of strength and balance. Baseline: initial HEP issued  Goal status: ongoing    2.  Pt will increased LEFS to at least 45/80 or greater, demonstrating improvement in functional mobility. Baseline: 35/80; 09/14/21 27/80 Goal status: ongoing    3.  Pt will increased L knee MMT to 4/5 for improved LE advancement and stability during gait. Baseline: 3-/5 to 4-/5 Goal status: ongoing    4.  Pt will ambulate with step through gait pattern with LRAD for household distances. Baseline: step to with antalgia Goal status: revised     5.  Pt will score 39/56 on the BERG to signify a decrease in fall risk.  Baseline: see above  Goal status: ongoing      PLAN: PT FREQUENCY: 2x week    PT DURATION: 6 weeks   PLANNED INTERVENTIONS: Therapeutic exercises, Therapeutic activity, Neuromuscular re-education, Balance training, Gait training, Patient/Family education, Joint mobilization, Stair training, Dry Needling, Electrical stimulation, Cryotherapy, Moist heat, Taping, Vasopneumatic device, Ionotophoresis 4mg /ml Dexamethasone, Manual therapy, and Re-evaluation   PLAN FOR NEXT SESSION:  Assess response to HEP/update PRN, LE strengthening and flexibility, balance.  , PT, DPT, ATC 10/06/21 4:18 PM

## 2021-10-13 ENCOUNTER — Ambulatory Visit: Payer: Medicaid Other

## 2021-10-13 DIAGNOSIS — M6281 Muscle weakness (generalized): Secondary | ICD-10-CM

## 2021-10-13 DIAGNOSIS — R2689 Other abnormalities of gait and mobility: Secondary | ICD-10-CM

## 2021-10-13 DIAGNOSIS — M79605 Pain in left leg: Secondary | ICD-10-CM | POA: Diagnosis not present

## 2021-10-13 NOTE — Therapy (Signed)
OUTPATIENT PHYSICAL THERAPY TREATMENT NOTE   Patient Name: Brett Butler MRN: [id number] DOB:Nov 29, 1965, 56 y.o., male Today's Date: 10/13/2021  PCP: Storm Frisk, MD REFERRING PROVIDER: Storm Frisk, MD  END OF SESSION:   PT End of Session - 10/13/21 1540     Visit Number 7    Number of Visits 15    Date for PT Re-Evaluation 10/29/21    Authorization Type Medicaid    Authorization Time Period 8/4-9/14/23    Authorization - Visit Number 4    Authorization - Number of Visits 6    PT Start Time 1542   pt arrived late to session   PT Stop Time 1618    PT Time Calculation (min) 36 min    Activity Tolerance Patient tolerated treatment well    Behavior During Therapy [facility] for tasks assessed/performed                Past Medical History:  Diagnosis Date   ARF (acute renal failure) (HCC) 09/28/2020   Hyperkalemia 09/29/2020   Past Surgical History:  Procedure Laterality Date   ANKLE SURGERY     IR FLUORO GUIDE CV LINE RIGHT  09/30/2020   IR FLUORO GUIDE CV LINE RIGHT  10/07/2020   IR REMOVAL TUN CV CATH W/O FL  10/22/2020   IR US GUIDE VASC ACCESS RIGHT  09/30/2020   IR US GUIDE VASC ACCESS RIGHT  10/07/2020   Patient Active Problem List   Diagnosis Date Noted   Tobacco abuse 08/02/2021   Carpal tunnel syndrome of right wrist 08/02/2021   Colon cancer screening 08/02/2021   Rhabdomyolysis 09/29/2020   History of cocaine use 09/29/2020   Injury of left sciatic nerve    Schizoaffective disorder, depressive type (HCC) 09/10/2019   Cannabis use disorder, moderate, dependence (HCC) 09/10/2019   PTSD (post-traumatic stress disorder) 09/10/2019   Alcohol dependence, uncomplicated (HCC) 04/28/2019   Generalized anxiety disorder 04/28/2019   Opioid dependence in remission (HCC) 04/28/2019    REFERRING DIAG: G62.9 (ICD-10-CM) - Neuropathy  THERAPY DIAG:  Pain in left leg  Muscle weakness (generalized)  Other abnormalities of gait and mobility  Rationale for  Evaluation and Treatment Rehabilitation  PERTINENT HISTORY: None  PRECAUTIONS: None  SUBJECTIVE:  Pt arrives and states his smptoms have been somewhat more irritable, attributes to weather. Also reports some toe discomfort since last session -describes as a shock. Doing HEP every other day. 6/10 pain on arrival.  PAIN:  Are you having pain? Yes: NPRS scale: 10/10 Pain location: Lt foot and calf Pain description: numbness/tingling/stiffness Aggravating factors: prolonged sitting, cold Relieving factors: walking   OBJECTIVE: (objective measures completed at initial evaluation unless otherwise dated)  DIAGNOSTIC FINDINGS: NA   PATIENT SURVEYS:  LEFS 35/80 09/14/21: 27/80   COGNITION:           Overall cognitive status: Within functional limits for tasks assessed                          SENSATION: WFL   EDEMA:  NA   MUSCLE LENGTH: Hamstrings: Right 45 deg; Left 52 deg   POSTURE:  forward flexed posture, lean to R side, L knee flexion   PALPATION: NA   LOWER EXTREMITY ROM:   Active ROM Right eval Left eval  Hip flexion      Hip extension      Hip abduction      Hip adduction      Hip internal  rotation      Hip external rotation      Knee flexion 130 123  Knee extension 0 10  Ankle dorsiflexion      Ankle plantarflexion      Ankle inversion      Ankle eversion       (Blank rows = not tested)   LOWER EXTREMITY MMT:   MMT Right eval Left eval 09/14/21 10/06/21  Hip flexion 4+/5 4+/5 Bilateral 5/5   Hip extension        Hip abduction        Hip adduction        Hip internal rotation        Hip external rotation        Knee flexion 5/5 4-/5 Lt: 4-/5   Knee extension 5/5 3-/5 Lt: 3+/5   Ankle dorsiflexion 5/5 2/5 Lt: 3-/5   Ankle plantarflexion 5/5 3/5 Lt: 3+/5 in sitting Able to complete partial range of SL calf raise on the LLE  Ankle inversion        Ankle eversion         (Blank rows = not tested)   LOWER EXTREMITY SPECIAL TESTS:  N/A    FUNCTIONAL TESTS:  Berg Balance Scale: 08/24/21 BERG BALANCE TEST Sitting to Standing: 4.      Stands without using hands and stabilize independently Standing Unsupported: 4.      Stands safely for 2 minutes Sitting Unsupported: 4.     Sits for 2 minutes independently Standing to Sitting: 4.     Sits safely with minimal use of hands Transfers: 3.     Transfers safely definite use of hands Standing with eyes closed: 3.     Stands 10 seconds with supervision Standing with feet together: 3.     Stands for 1 minute with supervision Reaching forward with outstretched arm: 3.     Reaches forward 5 inches Retrieving object from the floor: 3.     Able to pick up with supervision Turning to look behind: 1.     Needs supervision when turning Turning 360 degrees: 2.     Able to turn slowly, but safely Place alternate foot on stool: 0.     Unable, needs assist to keep from falling Standing with one foot in front: 0.     Loses balance while standing/stepping Standing on one foot: 0.     Unable  Total Score: 34/56 BERG BALANCE TEST 09/14/21 Sitting to Standing: 4.      Stands without using hands and stabilize independently Standing Unsupported: 4.      Stands safely for 2 minutes Sitting Unsupported: 4.     Sits for 2 minutes independently Standing to Sitting: 4.     Sits safely with minimal use of hands Transfers: 3.     Transfers safely definite use of hands Standing with eyes closed: 3.     Stands 10 seconds with supervision Standing with feet together: 3.     Stands for 1 minute with supervision Reaching forward with outstretched arm: 0.     Loses balance/requires assistace Retrieving object from the floor: 3.     Able to pick up with supervision Turning to look behind: 2.     Turns sideways only, maintains balance Turning 360 degrees: 1.     Needs supervision or verbal cueing Place alternate foot on stool: 0.     Unable, needs assist to keep from falling Standing with one foot in front: 0.  Loses balance while standing/stepping Standing on one foot: 1.     Holds <3 seconds  Total Score: 32/56    GAIT: Distance walked: 100 Assistive device utilized: Crutches one on L side Level of assistance: Modified independence Comments: L knee flexed, slow gait speed, step to gait pattern  09/14/21: 20 ft; SPC; Mod I; lateral trunk lean, slow gait speed, step to pattern        TODAY'S TREATMENT: [facility] Adult PT Treatment:                                                DATE: 10/13/21  Neuromuscular re-ed: Triple flex>extension, standing in // bars 3x8, cues for appropriate pacing/posture, for improved neural sensitivity and posterior chain activation, tactile/verbal cues as needed HS curls // bars 2x8 for improved HS activation, tactile cues  Therapeutic Activity: STS from standard chair - 2x10 cues for velocity on ascent, eccentric control. Requires cues for fwd trunk lean, inc time/effort and pacing of activities to maximize performance Education as below   [facility] Adult PT Treatment:                                                DATE: 10/06/21 Therapeutic Exercise: strengthening in // bars  NuStep level 6 x 5 minutes  Standing march  2 x 10 Standing calf raise 2 x 10 SL calf raise on LLE x 3; partial range Sit to stand x 5 HS curl 2 x 10  HEP updated     [facility] Adult PT Treatment:                                                DATE: 09/21/21 Therapeutic Exercise: NuStep level 4 x 5 minutes UE/LE Hip bridge 2 x 10  SLR 2 x 10  Hooklying resisted hip abduction green band 2 x 10  Resisted plantarflexion green band 2 x 10  Seated dorsiflexion 2 x 10  Updated HEP       PATIENT EDUCATION:  Education details: HEP, PT impairments and rationale for interventions Person educated: Patient Education method: Explanation, demo, cues, handout Education comprehension: verbalized understanding, returned demo, cues      HOME EXERCISE PROGRAM: Access Code: [email or web address] URL:  [email or web address] Date: 08/12/2021 Prepared by: Gardiner Rhyme   Exercises - Seated Calf Stretch with Strap  - 2 x daily - 7 x weekly - 3 sets - 20-30 seconds hold - Mini Squat with Counter Support  - 1 x daily - 7 x weekly - 2 sets - 5-10 reps - Side to Side Weight Shift with Counter Support  - 1 x daily - 7 x weekly - 1-2 sets - 10 reps - Staggered Stance Forward Backward Weight Shift with Counter Support  - 1 x daily - 7 x weekly - 1-2 sets - 10 reps   ASSESSMENT:   CLINICAL IMPRESSION: Pt arrives w/ report of increased pain levels which he attributes to the weather, session shortened d/t time constraints w/ pt arriving late. Improved mechanics w/ STS from standard chair, although demonstrates reduced WB through LLE and reduced  fwd weight shift, improves w/ cues and repetition. Reduced eccentric control noted on STS although improves w/ cues/repetition. Good tolerance to WB exercises in parallel bars for improved stability, emphasis on improving posterior chain activation given tendency to maintain flexed posture. Denies increase in pain throughout session although he does endorse muscular fatigue. Remains limited by reduced mobility/strength and nerve-like symptoms, no adverse events throughout session.     OBJECTIVE IMPAIRMENTS decreased activity tolerance, decreased balance, decreased mobility, difficulty walking, decreased ROM, decreased strength, increased fascial restrictions, impaired perceived functional ability, impaired flexibility, improper body mechanics, and pain.    ACTIVITY LIMITATIONS carrying, lifting, standing, squatting, stairs, bathing, and locomotion level   PARTICIPATION LIMITATIONS: meal prep, cleaning, laundry, and community activity   PERSONAL FACTORS Past/current experiences, Time since onset of injury/illness/exacerbation, Transportation, and 1 comorbidity: kidney disease  are also affecting patient's functional outcome.    REHAB POTENTIAL: Good    CLINICAL DECISION MAKING: Stable/uncomplicated   EVALUATION COMPLEXITY: Low     GOALS: Goals reviewed with patient? No   SHORT TERM GOALS: Target date: 09/02/2021  Pt will be independent with initial HEP. Baseline: prescribed at initial eval Goal status: achieved     2.  Assess Berg Balance Test and create LTG. Baseline:  Goal status: achieved      LONG TERM GOALS: Target date: 09/23/2021    Pt will be independent with advanced HEP for continued progression of strength and balance. Baseline: initial HEP issued  Goal status: ongoing    2.  Pt will increased LEFS to at least 45/80 or greater, demonstrating improvement in functional mobility. Baseline: 35/80; 09/14/21 27/80 Goal status: ongoing    3.  Pt will increased L knee MMT to 4/5 for improved LE advancement and stability during gait. Baseline: 3-/5 to 4-/5 Goal status: ongoing    4.  Pt will ambulate with step through gait pattern with LRAD for household distances. Baseline: step to with antalgia Goal status: revised     5.  Pt will score 39/56 on the BERG to signify a decrease in fall risk.  Baseline: see above  Goal status: ongoing      PLAN: PT FREQUENCY: 2x week    PT DURATION: 6 weeks   PLANNED INTERVENTIONS: Therapeutic exercises, Therapeutic activity, Neuromuscular re-education, Balance training, Gait training, Patient/Family education, Joint mobilization, Stair training, Dry Needling, Electrical stimulation, Cryotherapy, Moist heat, Taping, Vasopneumatic device, Ionotophoresis 4mg /ml Dexamethasone, Manual therapy, and Re-evaluation   PLAN FOR NEXT SESSION:  Assess response to HEP/update PRN, progress LE mobility/strengthening exercises as able/appropriate, balance activities  PT, DPT 10/13/2021 4:57 PM

## 2021-10-20 ENCOUNTER — Ambulatory Visit: Payer: Medicaid Other

## 2021-10-27 ENCOUNTER — Ambulatory Visit: Payer: Medicaid Other

## 2021-11-01 ENCOUNTER — Ambulatory Visit: Payer: Medicaid Other | Admitting: Physical Medicine and Rehabilitation

## 2021-11-03 ENCOUNTER — Telehealth: Payer: Self-pay

## 2021-11-03 ENCOUNTER — Ambulatory Visit: Payer: Medicaid Other

## 2021-11-03 NOTE — Telephone Encounter (Signed)
Spoke with patient regarding missed PT appointment. He states that his sister called earlier this week to cancel both visits. Reminded him of next scheduled appointment.   Gwendolyn Grant, PT, DPT, ATC 11/03/21 3:11 PM

## 2021-11-09 NOTE — Therapy (Signed)
OUTPATIENT PHYSICAL THERAPY TREATMENT NOTE/RE-CERTIFICATION PHYSICAL THERAPY DISCHARGE SUMMARY  Visits from Start of Care: 8  Current functional level related to goals / functional outcomes: See goals below    Remaining deficits: Lt ankle weakness LLE pain SL balance    Education / Equipment: See below    Patient agrees to discharge. Patient goals were partially met. Patient is being discharged due to maximized rehab potential.     Patient Name: Brett Butler MRN: [id number] DOB:[birth year], 56 y.o., male Today's Date: 11/11/2021  PCP: Elsie Stain, MD REFERRING PROVIDER: Elsie Stain, MD  END OF SESSION:   PT End of Session - 11/10/21 1532     Visit Number 8    Number of Visits 15    Date for PT Re-Evaluation --   n/a; D/C   Authorization Type Medicaid- out of auth; completing re-evaluation only    PT Start Time 3893    PT Stop Time 1608    PT Time Calculation (min) 37 min    Activity Tolerance Patient tolerated treatment well    Behavior During Therapy [facility] for tasks assessed/performed                 Past Medical History:  Diagnosis Date   ARF (acute renal failure) ([place]) 09/28/2020   Hyperkalemia 09/29/2020   Past Surgical History:  Procedure Laterality Date   ANKLE SURGERY     IR FLUORO GUIDE CV LINE RIGHT  09/30/2020   IR FLUORO GUIDE CV LINE RIGHT  10/07/2020   IR REMOVAL TUN CV CATH W/O FL  10/22/2020   IR US GUIDE VASC ACCESS RIGHT  09/30/2020   IR US GUIDE VASC ACCESS RIGHT  10/07/2020   Patient Active Problem List   Diagnosis Date Noted   Tobacco abuse 08/02/2021   Carpal tunnel syndrome of right wrist 08/02/2021   Colon cancer screening 08/02/2021   Rhabdomyolysis 09/29/2020   History of cocaine use 09/29/2020   Injury of left sciatic nerve    Schizoaffective disorder, depressive type ([place]) 09/10/2019   Cannabis use disorder, moderate, dependence ([place]) 09/10/2019   PTSD (post-traumatic stress disorder) 09/10/2019   Alcohol  dependence, uncomplicated ([place]) 73/42/8768   Generalized anxiety disorder 04/28/2019   Opioid dependence in remission ([place]) 04/28/2019    REFERRING DIAG: G62.9 (ICD-10-CM) - Neuropathy  THERAPY DIAG:  Pain in left leg  Muscle weakness (generalized)  Other abnormalities of gait and mobility  Rationale for Evaluation and Treatment Rehabilitation  PERTINENT HISTORY: None  PRECAUTIONS: None  SUBJECTIVE: Patient has missed recent appointments due to sickness. He was able to complete some of his exercises while sick. He has experienced one near fall since last session stating that his cane got caught on something and he fell into the wall.   PAIN:  Are you having pain? Yes: NPRS scale: 5/10 Pain location: Lt foot and calf Pain description: numbness/tingling/stiffness Aggravating factors: prolonged sitting, cold Relieving factors: walking   OBJECTIVE: (objective measures completed at initial evaluation unless otherwise dated)  DIAGNOSTIC FINDINGS: NA   PATIENT SURVEYS:  LEFS 35/80 09/14/21: 27/80 11/10/21: 35/80   COGNITION:           Overall cognitive status: Within functional limits for tasks assessed                          SENSATION: LLE neuropathy  EDEMA:  NA   MUSCLE LENGTH: Hamstrings: Right 45 deg; Left 52 deg   POSTURE:  forward flexed  posture, lean to R side, L knee flexion   PALPATION: NA   LOWER EXTREMITY ROM:   Active ROM Right eval Left eval  Hip flexion      Hip extension      Hip abduction      Hip adduction      Hip internal rotation      Hip external rotation      Knee flexion 130 123  Knee extension 0 10  Ankle dorsiflexion      Ankle plantarflexion      Ankle inversion      Ankle eversion       (Blank rows = not tested)   LOWER EXTREMITY MMT:   MMT Right eval Left eval 09/14/21 10/06/21 11/10/21 Left   Hip flexion 4+/5 4+/5 Bilateral 5/5  5  Hip extension         Hip abduction         Hip adduction         Hip internal  rotation         Hip external rotation         Knee flexion 5/5 4-/5 Lt: 4-/5  5/5  Knee extension 5/5 3-/5 Lt: 3+/5  5/5  Ankle dorsiflexion 5/5 2/5 Lt: 3-/5  3-/5   Ankle plantarflexion 5/5 3/5 Lt: 3+/5 in sitting Able to complete partial range of SL calf raise on the LLE Able to complete partial range of SL calf raise on the LLE  Ankle inversion         Ankle eversion          (Blank rows = not tested)   LOWER EXTREMITY SPECIAL TESTS:  N/A   FUNCTIONAL TESTS:  Berg Balance Scale: 08/24/21 BERG BALANCE TEST Sitting to Standing: 4.      Stands without using hands and stabilize independently Standing Unsupported: 4.      Stands safely for 2 minutes Sitting Unsupported: 4.     Sits for 2 minutes independently Standing to Sitting: 4.     Sits safely with minimal use of hands Transfers: 3.     Transfers safely definite use of hands Standing with eyes closed: 3.     Stands 10 seconds with supervision Standing with feet together: 3.     Stands for 1 minute with supervision Reaching forward with outstretched arm: 3.     Reaches forward 5 inches Retrieving object from the floor: 3.     Able to pick up with supervision Turning to look behind: 1.     Needs supervision when turning Turning 360 degrees: 2.     Able to turn slowly, but safely Place alternate foot on stool: 0.     Unable, needs assist to keep from falling Standing with one foot in front: 0.     Loses balance while standing/stepping Standing on one foot: 0.     Unable  Total Score: 34/56 BERG BALANCE TEST 09/14/21 Sitting to Standing: 4.      Stands without using hands and stabilize independently Standing Unsupported: 4.      Stands safely for 2 minutes Sitting Unsupported: 4.     Sits for 2 minutes independently Standing to Sitting: 4.     Sits safely with minimal use of hands Transfers: 3.     Transfers safely definite use of hands Standing with eyes closed: 3.     Stands 10 seconds with supervision Standing with feet  together: 3.     Stands for 1  minute with supervision Reaching forward with outstretched arm: 0.     Loses balance/requires assistace Retrieving object from the floor: 3.     Able to pick up with supervision Turning to look behind: 2.     Turns sideways only, maintains balance Turning 360 degrees: 1.     Needs supervision or verbal cueing Place alternate foot on stool: 0.     Unable, needs assist to keep from falling Standing with one foot in front: 0.     Loses balance while standing/stepping Standing on one foot: 1.     Holds <3 seconds  Total Score: 32/56  BERG BALANCE TEST 11/10/21 Sitting to Standing: 4.      Stands without using hands and stabilize independently Standing Unsupported: 4.      Stands safely for 2 minutes Sitting Unsupported: 4.     Sits for 2 minutes independently Standing to Sitting: 4.     Sits safely with minimal use of hands Transfers: 4.     Transfers safely with minor use of hands Standing with eyes closed: 2.     Able to stand for 3 seconds Standing with feet together: 4.     Stands for 1 minute safely Reaching forward with outstretched arm: 3.     Reaches forward 5 inches Retrieving object from the floor: 4.      Able to pick up easily and safely Turning to look behind: 4.     Looks behind from both sides and weight shifts well Turning 360 degrees: 2.     Able to turn slowly, but safely Place alternate foot on stool: 0.     Unable, needs assist to keep from falling Standing with one foot in front: 4.     Independent tandem for 30 seconds  Standing on one foot: 0.     Unable  Total Score: 44/56    GAIT: Distance walked: 100 Assistive device utilized: Crutches one on L side Level of assistance: Modified independence Comments: L knee flexed, slow gait speed, step to gait pattern  09/14/21: 20 ft; SPC; Mod I; lateral trunk lean, slow gait speed, step to pattern   11/10/21: 10 ft SPC Mod I; step through pattern; lateral trunk lean        TODAY'S  TREATMENT: [facility] Adult PT Treatment:                                                DATE: 10/27/21 Therapeutic Exercise: Reviewed and updated HEP discussing frequency, sets, reps, and resistance bands   [facility] Adult PT Treatment:                                                DATE: 10/13/21  Neuromuscular re-ed: Triple flex>extension, standing in // bars 3x8, cues for appropriate pacing/posture, for improved neural sensitivity and posterior chain activation, tactile/verbal cues as needed HS curls // bars 2x8 for improved HS activation, tactile cues  Therapeutic Activity: STS from standard chair - 2x10 cues for velocity on ascent, eccentric control. Requires cues for fwd trunk lean, inc time/effort and pacing of activities to maximize performance Education as below   [facility] Adult PT Treatment:  DATE: 10/06/21 Therapeutic Exercise: strengthening in // bars  NuStep level 6 x 5 minutes  Standing march  2 x 10 Standing calf raise 2 x 10 SL calf raise on LLE x 3; partial range Sit to stand x 5 HS curl 2 x 10  HEP updated         PATIENT EDUCATION:  Education details: HEP, D/C education  Person educated: Patient Education method: Consulting civil engineer, demo, handout Education comprehension: verbalized understanding, returned demo     HOME EXERCISE PROGRAM: Access Code: [email or web address] URL: [email or web address] Date: 11/10/2021 Prepared by: Gwendolyn Grant  Exercises - Seated Calf Stretch with Strap  - 2 x daily - 7 x weekly - 3 sets - 20-30 seconds hold - Mini Squat with Counter Support  - 1 x daily - 7 x weekly - 2 sets - 5-10 reps - Side to Side Weight Shift with Counter Support  - 1 x daily - 7 x weekly - 1-2 sets - 10 reps - Staggered Stance Forward Backward Weight Shift with Counter Support  - 1 x daily - 7 x weekly - 1-2 sets - 10 reps - Supine Active Straight Leg Raise  - 1 x daily - 7 x weekly - 2 sets - 10 reps - Supine Bridge  - 1  x daily - 7 x weekly - 2 sets - 10 reps - Hooklying Clamshell with Resistance  - 1 x daily - 7 x weekly - 2 sets - 10 reps - Seated Ankle Plantar Flexion with Resistance Loop  - 1 x daily - 7 x weekly - 2 sets - 10 reps - Seated Toe Raise  - 1 x daily - 7 x weekly - 2 sets - 10 reps - Sit to Stand  - 1 x daily - 7 x weekly - 2 sets - 10 reps - Heel Raises with Counter Support  - 1 x daily - 7 x weekly - 2 sets - 10 reps - Tandem Stance  - 1 x daily - 7 x weekly - 3 sets - 30 sec  hold - Standing Toe Raises at Chair  - 1 x daily - 7 x weekly - 2 sets - 10 reps   ASSESSMENT:   CLINICAL IMPRESSION:  Patient has progressed well since the start of caring demonstrating overall improvements in LLE strength, balance and gait. He demonstrates signficant improvement in knee strength, but no change in ankle strength compared to last re-assessment in early August. His BERG score has significantly improved with patient having lingering deficits with SL balance. He has improved gait stability and mechanics since utilizing SPC or single axillary crutch in the RUE. He  was encouraged to continue to utilize his AFO given ongoing ankle weakness.He has met all established functional goals with the exception of his LEFS score. He demonstrates independence with advanced home program  and is appropriate for D/C at this time with patient in agreement with this plan.    OBJECTIVE IMPAIRMENTS decreased activity tolerance, decreased balance, decreased mobility, difficulty walking, decreased ROM, decreased strength, increased fascial restrictions, impaired perceived functional ability, impaired flexibility, improper body mechanics, and pain.    ACTIVITY LIMITATIONS carrying, lifting, standing, squatting, stairs, bathing, and locomotion level   PARTICIPATION LIMITATIONS: meal prep, cleaning, laundry, and community activity   PERSONAL FACTORS Past/current experiences, Time since onset of injury/illness/exacerbation,  Transportation, and 1 comorbidity: kidney disease  are also affecting patient's functional outcome.    REHAB POTENTIAL: Good   CLINICAL DECISION MAKING: Stable/uncomplicated   EVALUATION  COMPLEXITY: Low     GOALS: Goals reviewed with patient? No   SHORT TERM GOALS: Target date: 09/02/2021  Pt will be independent with initial HEP. Baseline: prescribed at initial eval Goal status: achieved     2.  Assess Berg Balance Test and create LTG. Baseline:  Goal status: achieved      LONG TERM GOALS: Target date: 09/23/2021    Pt will be independent with advanced HEP for continued progression of strength and balance. Baseline: initial HEP issued  Goal status: met    2.  Pt will increased LEFS to at least 45/80 or greater, demonstrating improvement in functional mobility. Baseline: 35/80; 09/14/21 27/80;  Goal status: not met     3.  Pt will increased L knee MMT to 4/5 for improved LE advancement and stability during gait. Baseline: 3-/5 to 4-/5 Goal status: met    4.  Pt will ambulate with step through gait pattern with LRAD for household distances. Baseline: step to with antalgia Goal status: met     5.  Pt will score 39/56 on the BERG to signify a decrease in fall risk.  Baseline: see above  Goal status: met      PLAN: PT FREQUENCY: 1x week    PT DURATION: 6 weeks   PLANNED INTERVENTIONS: Therapeutic exercises, Therapeutic activity, Neuromuscular re-education, Balance training, Gait training, Patient/Family education, Joint mobilization, Stair training, Dry Needling, Electrical stimulation, Cryotherapy, Moist heat, Taping, Vasopneumatic device, Ionotophoresis 45m/ml Dexamethasone, Manual therapy, and Re-evaluation   PLAN FOR NEXT SESSION:  n/a d/c  SGwendolyn Grant PT, DPT, ATC 11/11/21 8:36 AM

## 2021-11-10 ENCOUNTER — Ambulatory Visit: Payer: Medicaid Other | Attending: Critical Care Medicine

## 2021-11-10 DIAGNOSIS — R2689 Other abnormalities of gait and mobility: Secondary | ICD-10-CM | POA: Insufficient documentation

## 2021-11-10 DIAGNOSIS — M6281 Muscle weakness (generalized): Secondary | ICD-10-CM | POA: Insufficient documentation

## 2021-11-10 DIAGNOSIS — M79605 Pain in left leg: Secondary | ICD-10-CM | POA: Diagnosis present

## 2021-12-08 ENCOUNTER — Other Ambulatory Visit: Payer: Self-pay

## 2021-12-08 ENCOUNTER — Encounter
Payer: Medicaid Other | Attending: Physical Medicine and Rehabilitation | Admitting: Physical Medicine and Rehabilitation

## 2021-12-08 VITALS — BP 123/77 | HR 86 | Ht 68.0 in | Wt 144.0 lb

## 2021-12-08 DIAGNOSIS — G629 Polyneuropathy, unspecified: Secondary | ICD-10-CM | POA: Diagnosis present

## 2021-12-08 MED ORDER — GABAPENTIN 300 MG PO CAPS
300.0000 mg | ORAL_CAPSULE | Freq: Three times a day (TID) | ORAL | 3 refills | Status: DC
  Filled 2021-12-08 – 2022-01-26 (×2): qty 90, 30d supply, fill #0

## 2021-12-08 MED ORDER — CAPSAICIN-CLEANSING GEL 8 % EX KIT
4.0000 | PACK | Freq: Once | CUTANEOUS | Status: AC
  Administered 2021-12-08: 4 via TOPICAL

## 2021-12-08 NOTE — Progress Notes (Signed)
Subjective:    Patient ID: Brett Butler, male    DOB: Jan 07, 1966, 56 y.o.   MRN: [id number]  HPI Brett Butler is a 56 year old man who presents to establish care for neuropathy  1) Neuropathy -started suddenly in September of last year -he work up and suddenly he could not stand on the leg -it has improved- it was all the way up in the leg and now is only on the calf and foot -he has tried gabapentin but did not feel like it was helping -he was on Gabapentin 300mg  and it did help but he felt it did not help enough so he stopped.   2) Insomnia: -usus muscle relaxers to help him sleep at night  Pain Inventory Average Pain 7 Pain Right Now 7 My pain is constant, sharp, burning, and aching  In the last 24 hours, has pain interfered with the following? General activity 7 Relation with others 7 Enjoyment of life 7 What TIME of day is your pain at its worst? night Sleep (in general) Fair  Pain is worse with: walking and bending Pain improves with: heat/ice Relief from Meds: 4  walk with assistance use a cane use a walker how many minutes can you walk? 40 ability to climb steps?  yes do you drive?  no  not employed: date last employed .  weakness numbness trouble walking spasms  New pt  New pt    No family history on file. Social History   Socioeconomic History   Marital status: Legally Separated    Spouse name: Not on file   Number of children: Not on file   Years of education: Not on file   Highest education level: Not on file  Occupational History   Not on file  Tobacco Use   Smoking status: Every Day    Types: Cigarettes   Smokeless tobacco: Never  Substance and Sexual Activity   Alcohol use: Yes    Alcohol/week: 10.0 standard drinks of alcohol    Types: 10 Cans of beer per week   Drug use: Yes    Types: Cocaine   Sexual activity: Not on file  Other Topics Concern   Not on file  Social History Narrative   Not on file   Social Determinants  of Health   Financial Resource Strain: Not on file  Food Insecurity: Not on file  Transportation Needs: Not on file  Physical Activity: Not on file  Stress: Not on file  Social Connections: Not on file   Past Surgical History:  Procedure Laterality Date   ANKLE SURGERY     IR FLUORO GUIDE CV LINE RIGHT  09/30/2020   IR FLUORO GUIDE CV LINE RIGHT  10/07/2020   IR REMOVAL TUN CV CATH W/O FL  10/22/2020   IR US GUIDE VASC ACCESS RIGHT  09/30/2020   IR US GUIDE VASC ACCESS RIGHT  10/07/2020   Past Medical History:  Diagnosis Date   ARF (acute renal failure) ([place]) 09/28/2020   Hyperkalemia 09/29/2020   BP 123/77   Pulse 86   Ht 5\' 8"  (1.727 m)   Wt 144 lb (65.3 kg)   SpO2 97%   BMI 21.90 kg/m   Opioid Risk Score:   Fall Risk Score:  `1  Depression screen [facility] 2/9     08/11/2021    9:41 AM 08/02/2021   11:19 AM 05/11/2021    1:46 PM 03/30/2021   11:25 AM  Depression screen PHQ 2/9  Decreased Interest  1 1 0 3  Down, Depressed, Hopeless 1 1 2 2   PHQ - 2 Score 2 2 2 5   Altered sleeping 3 2 3 3   Tired, decreased energy 1 2 0 3  Change in appetite 3 1 0 0  Feeling bad or failure about yourself  2 2 3  0  Trouble concentrating 2 1 0 2  Moving slowly or fidgety/restless 2 1 3 1   Suicidal thoughts 0 1 0 2  PHQ-9 Score 15 12 11 16   Difficult doing work/chores Somewhat difficult        Review of Systems  Respiratory:  Positive for cough and shortness of breath.   Musculoskeletal:  Positive for gait problem.       Lower left leg pain spasms  Neurological:  Positive for weakness and numbness.  All other systems reviewed and are negative.     Objective:   Physical Exam Gen: no distress, normal appearing HEENT: oral mucosa pink and moist, NCAT Cardio: Reg rate Chest: normal effort, normal rate of breathing Abd: soft, non-distended Ext: no edema Psych: pleasant, normal affect Skin: intact Neuro: Alert and oriented x3, decreased sensation Musculoskeletal: Uses cane for  ambulation, left foot drop, using AFO       Assessment & Plan:   1( Neuropathy -Discussed Qutenza as an option for neuropathic pain control. Discussed that this is a capsaicin patch, stronger than capsaicin cream. Discussed that it is currently approved for diabetic peripheral neuropathy and post-herpetic neuralgia, but that it has also shown benefit in treating other forms of neuropathy. Provided patient with link to site to learn more about the patch: [employer]fr. Discussed that the patch would be placed in office and benefits usually last 3 months. Discussed that unintended exposure to capsaicin can cause severe irritation of eyes, mucous membranes, respiratory tract, and skin, but that Qutenza is a local treatment and does not have the systemic side effects of other nerve medications. Discussed that there may be pain, itching, erythema, and decreased sensory function associated with the application of Qutenza. Side effects usually subside within 1 week. A cold pack of analgesic medications can help with these side effects. Blood pressure can also be increased due to pain associated with administration of the patch.   2) Insomnia: Gabapentin 300mg  HS  3) Left foot drop: -continue AFO -prescribed PT

## 2021-12-15 ENCOUNTER — Other Ambulatory Visit: Payer: Self-pay

## 2022-01-26 ENCOUNTER — Ambulatory Visit: Payer: Medicaid Other | Attending: Physician Assistant | Admitting: Physician Assistant

## 2022-01-26 ENCOUNTER — Telehealth: Payer: Self-pay | Admitting: Physician Assistant

## 2022-01-26 ENCOUNTER — Other Ambulatory Visit: Payer: Self-pay

## 2022-01-26 ENCOUNTER — Encounter: Payer: Self-pay | Admitting: Physician Assistant

## 2022-01-26 VITALS — BP 136/76 | HR 84 | Wt 147.2 lb

## 2022-01-26 DIAGNOSIS — Z8739 Personal history of other diseases of the musculoskeletal system and connective tissue: Secondary | ICD-10-CM | POA: Diagnosis not present

## 2022-01-26 DIAGNOSIS — Z1331 Encounter for screening for depression: Secondary | ICD-10-CM | POA: Diagnosis not present

## 2022-01-26 DIAGNOSIS — Z79899 Other long term (current) drug therapy: Secondary | ICD-10-CM | POA: Insufficient documentation

## 2022-01-26 DIAGNOSIS — F4321 Adjustment disorder with depressed mood: Secondary | ICD-10-CM

## 2022-01-26 DIAGNOSIS — F4320 Adjustment disorder, unspecified: Secondary | ICD-10-CM | POA: Diagnosis present

## 2022-01-26 MED ORDER — ESCITALOPRAM OXALATE 10 MG PO TABS
10.0000 mg | ORAL_TABLET | Freq: Every day | ORAL | 2 refills | Status: DC
  Filled 2022-01-26: qty 60, 60d supply, fill #0

## 2022-01-26 NOTE — Progress Notes (Signed)
Patient ID: Brett Butler, male   DOB: 1965-08-07, 56 y.o.   MRN: [id number]   Brett Butler, is a 56 y.o. male  [id number]  [id number]  DOB - 1965-06-06  No chief complaint on file.      Subjective:   Brett Butler is a 56 y.o. male here today wanting to get bloodwork done to see if he can donate plasma.  He doesn't know specifically what testing he might need.   He says the lexapro caused him diarrhea at the 10mg  dose but he never tried it at a lower dose.  He is willing to try it again(bc he currently can't afford to try something new and still has lexapro at home).  He will titrate this time.  His mom died in 2022-09-08 and he is having a hard time adjusting.  He still has his sister and a bible study group for support.  PHQ9 #9=1.  He says he has thoughts of "not wanting to be here" but denies any plan or intent of harming himself.   No problems updated.  ALLERGIES: Allergies  Allergen Reactions   Other Hives and Rash    peaches    PAST MEDICAL HISTORY: Past Medical History:  Diagnosis Date   ARF (acute renal failure) ([facility]) 09/28/2020   Hyperkalemia 09/29/2020    MEDICATIONS AT HOME: Prior to Admission medications   Medication Sig Start Date End Date Taking? Authorizing Provider  cyclobenzaprine (FLEXERIL) 10 MG tablet Take 1 tablet (10 mg total) by mouth 3 (three) times daily as needed for muscle spasms. 08/02/21  Yes 08/04/21, MD  gabapentin (NEURONTIN) 300 MG capsule Take 1 capsule (300 mg total) by mouth 3 (three) times daily. 12/08/21  Yes Raulkar, 12/10/21, MD  escitalopram (LEXAPRO) 10 MG tablet Take 1 tablet (10 mg total) by mouth once daily. 01/26/22   Joshiah Traynham, 01/28/22, PA-C    ROS: Neg HEENT Neg resp Neg cardiac Neg GI Neg GU Neg MS Neg neuro  Objective:   Vitals:   01/26/22 1011  BP: 136/76  Pulse: 84  SpO2: 96%  Weight: 147 lb 3.2 oz (66.8 kg)   Exam General appearance : Awake, alert, not in any distress. Speech Clear. Not toxic  looking HEENT: Atraumatic and Normocephalic Neck: Supple, no JVD. No cervical lymphadenopathy.  Chest: Good air entry bilaterally, CTAB.  No rales/rhonchi/wheezing CVS: S1 S2 regular, no murmurs.  Extremities: B/L Lower Ext shows no edema, both legs are warm to touch Neurology: Awake alert, and oriented X 3, CN II-XII intact, Non focal Skin: No Rash  Data Review No results found for: "HGBA1C"    01/26/2022   10:38 AM 01/26/2022   10:14 AM 08/11/2021    9:41 AM  Depression screen PHQ 2/9  Decreased Interest 2 2 1   Down, Depressed, Hopeless 2 2 1   PHQ - 2 Score 4 4 2   Altered sleeping 2 2 3   Tired, decreased energy 2 2 1   Change in appetite 0 0 3  Feeling bad or failure about yourself  3 3 2   Trouble concentrating 0 0 2  Moving slowly or fidgety/restless 0 0 2  Suicidal thoughts 1 3 0  PHQ-9 Score 12 14 15   Difficult doing work/chores   Somewhat difficult    Assessment & Plan   1. History of rhabdomyolysis - CBC with Differential/Platelet - Basic metabolic panel - CK  2. Grief reaction Self care, advised to use grief resources such as hospice.  I will ask 4/9  LCSW to reach out to him for resources/counseling - escitalopram (LEXAPRO) 10 MG tablet; Take 1 tablet (10 mg total) by mouth once daily.  Dispense: 60 tablet; Refill: 2 Titrate this time-we discussed how to do this  3. Positive depression screening - escitalopram (LEXAPRO) 10 MG tablet; Take 1 tablet (10 mg total) by mouth once daily.  Dispense: 60 tablet; Refill: 2    Return in about 4 months (around 05/28/2022) for see PCP for chronic conditions(Dr Delford Field).  The patient was given clear instructions to go to ER or return to medical center if symptoms don't improve, worsen or new problems develop. The patient verbalized understanding. The patient was told to call to get lab results if they haven't heard anything in the next week.      Georgian Co, PA-C [facility]  [name], [place] [phone]   01/26/2022, 10:38 AM

## 2022-01-29 LAB — BASIC METABOLIC PANEL
BUN/Creatinine Ratio: 22 — ABNORMAL HIGH (ref 9–20)
BUN: 21 mg/dL (ref 6–24)
CO2: 23 mmol/L (ref 20–29)
Calcium: 9.2 mg/dL (ref 8.7–10.2)
Chloride: 104 mmol/L (ref 96–106)
Creatinine, Ser: 0.95 mg/dL (ref 0.76–1.27)
Glucose: 85 mg/dL (ref 70–99)
Potassium: 4.5 mmol/L (ref 3.5–5.2)
Sodium: 140 mmol/L (ref 134–144)
eGFR: 93 mL/min/{1.73_m2} (ref >59)

## 2022-01-29 LAB — CBC WITH DIFFERENTIAL/PLATELET
Basophils Absolute: 0.1 10*3/uL (ref 0.0–0.2)
Basos: 1 %
EOS (ABSOLUTE): 0.2 10*3/uL (ref 0.0–0.4)
Eos: 2 %
Hematocrit: 38.2 % (ref 37.5–51.0)
Hemoglobin: 12.8 g/dL — ABNORMAL LOW (ref 13.0–17.7)
Immature Grans (Abs): 0 10*3/uL (ref 0.0–0.1)
Immature Granulocytes: 0 %
Lymphocytes Absolute: 2.9 10*3/uL (ref 0.7–3.1)
Lymphs: 35 %
MCH: 30.8 pg (ref 26.6–33.0)
MCHC: 33.5 g/dL (ref 31.5–35.7)
MCV: 92 fL (ref 79–97)
Monocytes Absolute: 0.6 10*3/uL (ref 0.1–0.9)
Monocytes: 7 %
Neutrophils Absolute: 4.5 10*3/uL (ref 1.4–7.0)
Neutrophils: 55 %
Platelets: 325 10*3/uL (ref 150–450)
RBC: 4.16 x10E6/uL (ref 4.14–5.80)
RDW: 13 % (ref 11.6–15.4)
WBC: 8.3 10*3/uL (ref 3.4–10.8)

## 2022-01-29 LAB — CK: Total CK: 107 U/L (ref 41–331)

## 2022-01-31 NOTE — Progress Notes (Signed)
Call placed to patient unable to leave message on home  or cell number. Call to inform patient of resent lab results per Georgian Co, PA-C Patient's blood count is normal/much improved.  You have very mild anemia so you could consider taking an over the counter iron tablet to improve energy levels and to help your blood for plasma donation. Your kidney and liver function is normal. Your muscle enzymes are also normal.  Follow up as planned.

## 2022-02-02 ENCOUNTER — Other Ambulatory Visit: Payer: Self-pay

## 2022-03-02 NOTE — Telephone Encounter (Signed)
called patient today to introduce Rosana Hoes and to assess patients' mental health needs. Patient did not answer the phone. I wasn't able to leave a message with the patient. Patient was referred by Freeman Caldron for grief counseling

## 2022-03-13 ENCOUNTER — Encounter
Payer: Medicaid Other | Attending: Physical Medicine and Rehabilitation | Admitting: Physical Medicine and Rehabilitation

## 2022-03-13 ENCOUNTER — Encounter: Payer: Self-pay | Admitting: Physical Medicine and Rehabilitation

## 2022-03-13 VITALS — BP 110/73 | HR 89 | Ht 68.0 in | Wt 146.0 lb

## 2022-03-13 DIAGNOSIS — G629 Polyneuropathy, unspecified: Secondary | ICD-10-CM | POA: Insufficient documentation

## 2022-03-13 DIAGNOSIS — M21372 Foot drop, left foot: Secondary | ICD-10-CM | POA: Insufficient documentation

## 2022-03-13 MED ORDER — CAPSAICIN-CLEANSING GEL 8 % EX KIT
4.0000 | PACK | Freq: Once | CUTANEOUS | Status: AC
  Administered 2022-03-13: 4 via TOPICAL

## 2022-03-13 NOTE — Progress Notes (Signed)
Subjective:    Patient ID: Brett Butler, male    DOB: [birth year], 57 y.o.   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   2) Insomnia: -usus muscle relaxers to help him sleep at night  3) Dry skin -he asks whether cooling gel could be causing this  4) Left foot drop: -needs a new AFO as his current is broken  Pain Inventory Average Pain 7 Pain Right Now 7 My pain is constant, sharp, burning, and aching  In the last 24 hours, has pain interfered with the following? General activity 7 Relation with others 7 Enjoyment of life 7 What TIME of day is your pain at its worst? night Sleep (in general) Fair  Pain is worse with: walking and bending Pain improves with: heat/ice Relief from Meds: 4  walk with assistance use a cane use a walker how many minutes can you walk? 40 ability to climb steps?  yes do you drive?  no  not employed: date last employed .  weakness numbness trouble walking spasms  New pt  New pt    History reviewed. No pertinent family history. Social History   Socioeconomic History   Marital status: Legally Separated    Spouse name: Not on file   Number of children: Not on file   Years of education: Not on file   Highest education level: Not on file  Occupational History   Not on file  Tobacco Use   Smoking status: Every Day     Types: Cigarettes   Smokeless tobacco: Never  Substance and Sexual Activity   Alcohol use: Yes    Alcohol/week: 10.0 standard drinks of alcohol    Types: 10 Cans of beer per week   Drug use: Yes    Types: Cocaine   Sexual activity: Not on file  Other Topics Concern   Not on file  Social History Narrative   Not on file   Social Determinants of Health   Financial Resource Strain: Not on file  Food Insecurity: Not on file  Transportation Needs: Not on file  Physical Activity: Not on file  Stress: Not on file  Social Connections: Not on file   Past Surgical History:  Procedure Laterality Date   ANKLE SURGERY     IR FLUORO GUIDE CV LINE RIGHT  09/30/2020   IR FLUORO GUIDE CV LINE RIGHT  10/07/2020   IR REMOVAL TUN CV CATH W/O FL  10/22/2020   IR US GUIDE VASC ACCESS RIGHT  09/30/2020   IR US GUIDE VASC ACCESS RIGHT  10/07/2020   Past Medical History:  Diagnosis Date   ARF (acute renal failure) ([facility]) 09/28/2020   Hyperkalemia 09/29/2020   BP 110/73   Pulse 89   Ht 5\' 8"  (1.727 m)  Wt 146 lb (66.2 kg)   SpO2 94%   BMI 22.20 kg/m   Opioid Risk Score:   Fall Risk Score:  `1  Depression screen [facility] 2/9     03/13/2022    9:52 AM 01/26/2022   10:38 AM 01/26/2022   10:14 AM 08/11/2021    9:41 AM 08/02/2021   11:19 AM 05/11/2021    1:46 PM 03/30/2021   11:25 AM  Depression screen [facility] 2/9  Decreased Interest 0 2 2 1 1  0 3  Down, Depressed, Hopeless 0 2 2 1 1 2 2   PHQ - 2 Score 0 4 4 2 2 2 5   Altered sleeping  2 2 3 2 3 3   Tired, decreased energy  2 2 1 2  0 3  Change in appetite  0 0 3 1 0 0  Feeling bad or failure about yourself   3 3 2 2 3  0  Trouble concentrating  0 0 2 1 0 2  Moving slowly or fidgety/restless  0 0 2 1 3 1   Suicidal thoughts  1 3 0 1 0 2  PHQ-9 Score  12 14 15 12 11 16   Difficult doing work/chores    Somewhat difficult        Review of Systems  Respiratory:  Positive for cough and shortness of breath.   Musculoskeletal:  Positive for gait problem.        Lower left leg pain spasms  Neurological:  Positive for weakness and numbness.  All other systems reviewed and are negative.      Objective:   Physical Exam Gen: no distress, normal appearing, weight 146 lbs, BMI 22.20, BP 110/73 HEENT: oral mucosa pink and moist, NCAT Cardio: Reg rate Chest: normal effort, normal rate of breathing Abd: soft, non-distended Ext: no edema Psych: pleasant, normal affect Skin: intact, dry Neuro: Alert and oriented x3, decreased sensation Musculoskeletal: Uses cane for ambulation, left foot drop, using AFO       Assessment & Plan:   1) Neuropathy -Discussed Qutenza as an option for neuropathic pain control. Discussed that this is a capsaicin patch, stronger than capsaicin cream. Discussed that it is currently approved for diabetic peripheral neuropathy and post-herpetic neuralgia, but that it has also shown benefit in treating other forms of neuropathy. Provided patient with link to site to learn more about the patch: . Discussed that the patch would be placed in office and benefits usually last 3 months. Discussed that unintended exposure to capsaicin can cause severe irritation of eyes, mucous membranes, respiratory tract, and skin, but that Qutenza is a local treatment and does not have the systemic side effects of other nerve medications. Discussed that there may be pain, itching, erythema, and decreased sensory function associated with the application of Qutenza. Side effects usually subside within 1 week. A cold pack of analgesic medications can help with these side effects. Blood pressure can also be increased due to pain associated with administration of the patch.   2 patches of Qutenza was applied to the area of pain. Ice packs were applied during the procedure to ensure patient comfort. Blood pressure was monitored every 15 minutes. The patient tolerated the procedure well. Post-procedure instructions were given and  follow-up has been scheduled.  -Provided with a pain relief journal and discussed that it contains foods and lifestyle tips to naturally help to improve pain. Discussed that these lifestyle strategies are also very good for health unlike some medications which can have negative side effects. Discussed  that the act of keeping a journal can be therapeutic and helpful to realize patterns what helps to trigger and alleviate pain.    Turmeric to reduce inflammation--can be used in cooking or taken as a supplement.  Benefits of turmeric:  -Highly anti-inflammatory  -Increases antioxidants  -Improves memory, attention, brain disease  -Lowers risk of heart disease  -May help prevent cancer  -Decreases pain  -Alleviates depression  -Delays aging and decreases risk of chronic disease  -Consume with black pepper to increase absorption    Turmeric Milk Recipe:  1 cup milk  1 tsp turmeric  1 tsp cinnamon  1 tsp grated ginger (optional)  Black pepper (boosts the anti-inflammatory properties of turmeric).  1 tsp honey     2) Insomnia: Gabapentin 300mg  [facility]  3) Left foot drop: -continue AFO, will provide order for a new one since current one is broken -prescribed PT  4) Dry skin: -advised applying coconut oil  40 minutes spent in discussion of risks and benefits of Qutenza and obtaining informed consent, discussion of q90 day follow-up and expectation of improvement in pain with each repeat application, discussion of response to last treatment, discussion of need for new AFO and completing [facility] script for him, discussion of pain journal and how to use turmeric, to use with black pepper to increase its absorption, discussion of how his pain initially started.

## 2022-05-31 ENCOUNTER — Encounter: Payer: Self-pay | Admitting: Critical Care Medicine

## 2022-05-31 ENCOUNTER — Ambulatory Visit: Payer: Medicaid Other | Attending: Critical Care Medicine | Admitting: Critical Care Medicine

## 2022-05-31 ENCOUNTER — Ambulatory Visit: Payer: Medicaid Other | Admitting: Critical Care Medicine

## 2022-05-31 ENCOUNTER — Other Ambulatory Visit: Payer: Self-pay

## 2022-05-31 VITALS — BP 117/72 | HR 70 | Temp 98.1°F | Ht 68.0 in | Wt 141.0 lb

## 2022-05-31 DIAGNOSIS — Z5901 Sheltered homelessness: Secondary | ICD-10-CM | POA: Insufficient documentation

## 2022-05-31 DIAGNOSIS — F4320 Adjustment disorder, unspecified: Secondary | ICD-10-CM | POA: Insufficient documentation

## 2022-05-31 DIAGNOSIS — F251 Schizoaffective disorder, depressive type: Secondary | ICD-10-CM

## 2022-05-31 DIAGNOSIS — F17210 Nicotine dependence, cigarettes, uncomplicated: Secondary | ICD-10-CM | POA: Insufficient documentation

## 2022-05-31 DIAGNOSIS — F411 Generalized anxiety disorder: Secondary | ICD-10-CM | POA: Insufficient documentation

## 2022-05-31 DIAGNOSIS — Z8739 Personal history of other diseases of the musculoskeletal system and connective tissue: Secondary | ICD-10-CM | POA: Insufficient documentation

## 2022-05-31 DIAGNOSIS — Z13220 Encounter for screening for lipoid disorders: Secondary | ICD-10-CM

## 2022-05-31 DIAGNOSIS — Z79899 Other long term (current) drug therapy: Secondary | ICD-10-CM | POA: Insufficient documentation

## 2022-05-31 DIAGNOSIS — Z1331 Encounter for screening for depression: Secondary | ICD-10-CM

## 2022-05-31 DIAGNOSIS — Z59812 Housing instability, housed, homelessness in past 12 months: Secondary | ICD-10-CM

## 2022-05-31 DIAGNOSIS — F4321 Adjustment disorder with depressed mood: Secondary | ICD-10-CM | POA: Diagnosis not present

## 2022-05-31 DIAGNOSIS — F1020 Alcohol dependence, uncomplicated: Secondary | ICD-10-CM | POA: Diagnosis not present

## 2022-05-31 DIAGNOSIS — Z720 Tobacco use: Secondary | ICD-10-CM

## 2022-05-31 DIAGNOSIS — T796XXS Traumatic ischemia of muscle, sequela: Secondary | ICD-10-CM

## 2022-05-31 MED ORDER — ESCITALOPRAM OXALATE 5 MG PO TABS
5.0000 mg | ORAL_TABLET | Freq: Every day | ORAL | 2 refills | Status: DC
  Filled 2022-05-31: qty 30, 30d supply, fill #0

## 2022-05-31 MED ORDER — GABAPENTIN 300 MG PO CAPS
300.0000 mg | ORAL_CAPSULE | Freq: Three times a day (TID) | ORAL | 3 refills | Status: DC
  Filled 2022-05-31: qty 90, 30d supply, fill #0

## 2022-05-31 NOTE — Assessment & Plan Note (Signed)
Patient is still drinking some alcohol but is markedly less in amount  Advise further abstinence from alcohol 

## 2022-05-31 NOTE — Assessment & Plan Note (Signed)
Patient smoking 1 cigarillos a day advised to get nicotine replacement 

## 2022-05-31 NOTE — Assessment & Plan Note (Signed)
This has resolved.

## 2022-05-31 NOTE — Patient Instructions (Signed)
Medications refilled , lexapro  tablet given Keep appt with rehab Apr 29  Return Dr Delford Field 6 months Labs will be obtained today Reduce tobacco intake

## 2022-05-31 NOTE — Assessment & Plan Note (Signed)
As per behavioral health continue Lexapro

## 2022-05-31 NOTE — Assessment & Plan Note (Signed)
Continue Lexapro reduce dose to 5 mg

## 2022-05-31 NOTE — Progress Notes (Signed)
New Patient Office Visit  Subjective:  Patient ID: Brett Butler, male    DOB: [birth year]  Age: 57 y.o. MRN: [id number]  CC:  Chief Complaint  Patient presents with   Follow-up    Follow up. Med refills.  Pain on L leg No to shingles vax.     HPI 03/2021 Brett Butler presents for this patient presents for primary care.  Patient has been followed previously at [facility] but when he achieve Medicaid they can no longer follow him.  He had a prolonged hospitalization in August 2022 where he was found unresponsive laying on 1 side developed severe acute rhabdomyolysis from breakdown of the thigh and calf muscles of his left leg.  He was intoxicated with cocaine had also opiates in the system responded to Narcan.  He had acute renal failure with hyperkalemia below is a copy of the discharge summary.  Date of admission: 09/28/2020             Date of discharge: 10/22/2020         Discharge Diagnoses:  Principal Problem:   ARF (acute renal failure) (HCC) Active Problems:   Rhabdomyolysis   Cocaine abuse (HCC)   Hyperkalemia   Leukocytosis   Injury of left sciatic nerve  History of present illness: As per the H and P dictated on admission, "Brett Butler is a 57 y.o. male with history of polysubstance abuse including cocaine was found to be lethargic after EMS was called by patient's friends.  Patient was given Narcan following which patient became more alert awake.  Patient states he snorted cocaine following which she fell onto the floor and had remained so for almost 12 hours.  Later on EMS was called and given Narcan and was brought to the ER.   ED Course: In the ER patient was noticed to not moving his left lower extremity and labs show markedly elevated CK levels more than 50,000 lactic acid of 5.1 WBC count was 27,000 LFTs were elevated with AST of 200 and ALT of 5200 total bilirubin was normal.  Potassium was 6.5 EKG showing peaked T waves.  Patient's creatinine about last year in June  was 1.04.  Patient was given temporizing measures for the hyperkalemia including calcium gluconate D50 insulin and Lokelma.  Patient underwent CT scan of the abdomen and left lower extremity which showed swelling of the left and external hamstring muscles concerning for myositis.  And also there was some concern for compartment syndrome.  Dr. Linna Caprice orthopedic surgeons was consulted and per orthopedic surgeon pressures checked did not show any signs of compartment syndrome.  But requested getting MRI of the L-spine since patient was complaining of low back pain and on my exam patient does have tenderness in the low back.  Patient unable to move his left lower extremity with no sensation in the distal half of the left lower extremity.  Good pulses.  COVID test was negative."   Hospital Course:  Summary of his active problems in the hospital is as following. 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 Urinalysis with significant hemoglobin, no RBCs. Nephrology consulted,  temporary HD catheter placed on 8/18 followed by dialysis 8/19. He has been getting dialysis per TTS schedule. Euvolemic on exam.  Output significant for last few days Nephrology currently holding HD given no indication. Renal function improving as well.  Monitor. Nephrology removed HD catheter. Outpt  follow up recommended    Nontraumatic rhabdomyolysis :  Resolved. Secondary to prolonged time lying down. Patient had significant amount of edema noted on imaging,  Orthopedics was consulted recommended no surgical management. CK trended down 272 ,  AST ALT trending down.   Acute anemia: Unknown etiology, No evidence of bleed. Possibly related to prior left thigh hematoma with possible persistent bleed. Patient has received 2 units of PRBCs. Hemoglobin remained stable.  No hematoma on ultrasound.    Left leg weakness / Neuropathic pain: This could be in the setting of prolonged time on the floor,  likely sciatic nerve damage. PT and OT recommending CIR,  Tolerating gabapentin now.  Continue. There is no Achilles contracture, Ortho recommended PRAFO (foot drop splint).   Paroxysmal atrial fibrillation: Heart rate now controlled, TTE unremarkable.   Continue metoprolol.  No prior history.  Likely in the setting of dehydration on admission.  Currently no need for anticoagulation.   Hyperkalemia:  Due to AKI, treated with [facility], now resolved.   Hyponatremia: Resolved.   Hypocalcemia: Now hypercalcemia Improved with calcium and vitamin D supplementation. Discontinue vitamin D supplementation as well.   Elevated troponin: In the setting of rhabdomyolysis.  Possibly demand ischemia. Patient denied any chest pain.   Cocaine abuse: Patient reports he snorts cocaine.  He thinks the cocaine may have been laced with fentanyl.   Counseled pt against abuse Child psychotherapist consulted.   Constipation Continue MiraLAX and Dulcolax suppository:   Patient was seen by physical therapy, who recommended Home Health,. On the day of the discharge the patient's vitals were stable, and no other new acute medical condition were reported. The patient was felt safe to be discharge at Home with Therapy.   Consultants: Nephrology IR Procedures: IR guided HD catheter placement and removal.  HD  This patient did undergo dialysis during that hospitalization his electrolytes improved unfortunately has not had any lab data since August to follow-up.  Also he lost his Medicaid card and does not have Medicaid at this time.  He needs to reapply.  He also needs orders for more physical therapy.  He is essentially wheelchair-bound and can walk around with a walker.  He has severe chronic pain of the left lower extremities.  Patient does have residual static nerve involvement with lumbar disc disease.  He was  responding to gabapentin but is run out of medications.  He does smoke a pack a day of cigarettes.  Note he is assisted today with his sister had a whom he is living with now.  He is no longer homeless.  He does have severe depression and tendency to want to relapse with his cocaine use but he is clean currently.  He did not get follow-up with physical medicine after discharge.  5/51 Is a 57 year old male prior history of rhabdomyolysis severe neuropathy after having been found unresponsive from a fentanyl overdose.  This patient in the interim has yet to see physical medicine or receive physical therapy even though we ordered both of these in February.  Patient also is due a tetanus vaccine he agrees to receive.  He needs colon cancer screening and agrees to receive the fecal occult kit.  Patient has schizoaffective disorder and severe anxiety he scores high on his suicide scale he does not have specific plans today but is thought about it.  His mother is in transition and hospice with metastatic cancer living in the same home.  Patient also was desponded over his lack of ability  to walk.  He states the gabapentin did not help his pain much she is out of this does not need to be refilled he states the cyclobenzaprine is helpful for his muscle spasm.  He does complain of right wrist pain which is new.  He has chronic bilateral knee arthritis.  05/31/22 Saw Brett Butler  01/26/22 Brett Butler is a 57 y.o. male here today wanting to get bloodwork done to see if he can donate plasma.  He doesn't know specifically what testing he might need.    He says the lexapro caused him diarrhea at the 10mg  dose but he never tried it at a lower dose.  He is willing to try it again(bc he currently can't afford to try something new and still has lexapro at home).  He will titrate this time.  His mom died in 08/30/22 and he is having a hard time adjusting.  He still has his sister and a bible study group for support.  PHQ9 #9=1.   He says he has thoughts of "not wanting to be here" but denies any plan or intent of harming himself.   No problems updated. 1. History of rhabdomyolysis - CBC with Differential/Platelet - Basic metabolic panel - CK   2. Grief reaction Self care, advised to use grief resources such as hospice.  I will ask Brett Naas LCSW to reach out to him for resources/counseling - escitalopram (LEXAPRO) 10 MG tablet; Take 1 tablet (10 mg total) by mouth once daily.  Dispense: 60 tablet; Refill: 2 Titrate this time-we discussed how to do this   3. Positive depression screening - escitalopram (LEXAPRO) 10 MG tablet; Take 1 tablet (10 mg total) by mouth once daily.  Dispense: 60 tablet; Refill: 2  05/31/2022 Patient seen in return follow-up his mental health has been improved he still smoking a pack a day he drinks 2 cans of beer daily blood pressure on arrival is good 117/72 he complains of leg hurting on the left he has neuropathy followed by rehab and has been applied a Qutenza treatment  He is still homeless living with his sister patient states the 10 mg Lexapro was too much medicine he is breaking in half now Past Medical History:  Diagnosis Date   ARF (acute renal failure) 09/28/2020   Hyperkalemia 09/29/2020   Rhabdomyolysis 09/29/2020    Past Surgical History:  Procedure Laterality Date   ANKLE SURGERY     IR FLUORO GUIDE CV LINE RIGHT  09/30/2020   IR FLUORO GUIDE CV LINE RIGHT  10/07/2020   IR REMOVAL TUN CV CATH W/O FL  10/22/2020   IR US GUIDE VASC ACCESS RIGHT  09/30/2020   IR US GUIDE VASC ACCESS RIGHT  10/07/2020    History reviewed. No pertinent family history.  Social History   Socioeconomic History   Marital status: Legally Separated    Spouse name: Not on file   Number of children: Not on file   Years of education: Not on file   Highest education level: Not on file  Occupational History   Not on file  Tobacco Use   Smoking status: Every Day    Types: Cigarettes    Smokeless tobacco: Never  Substance and Sexual Activity   Alcohol use: Yes    Alcohol/week: 10.0 standard drinks of alcohol    Types: 10 Cans of beer per week   Drug use: Yes    Types: Cocaine   Sexual activity: Not on file  Other Topics Concern  Not on file  Social History Narrative   Not on file   Social Determinants of Health   Financial Resource Strain: Not on file  Food Insecurity: Not on file  Transportation Needs: Not on file  Physical Activity: Not on file  Stress: Not on file  Social Connections: Not on file  Intimate Partner Violence: Not on file    ROS Review of Systems  Constitutional: Negative.   HENT: Negative.  Negative for ear pain, postnasal drip, rhinorrhea, sinus pressure, sore throat, trouble swallowing and voice change.   Eyes: Negative.   Respiratory: Negative.  Negative for apnea, cough, choking, chest tightness, shortness of breath, wheezing and stridor.   Cardiovascular: Negative.  Negative for chest pain, palpitations and leg swelling.  Gastrointestinal: Negative.  Negative for abdominal distention, abdominal pain, nausea and vomiting.  Genitourinary: Negative.   Musculoskeletal:  Positive for back pain and gait problem. Negative for arthralgias and myalgias.       LLE pain and weakness  Skin: Negative.  Negative for rash.  Allergic/Immunologic: Negative.  Negative for environmental allergies and food allergies.  Neurological:  Negative for dizziness, syncope, weakness and headaches.  Hematological: Negative.  Negative for adenopathy. Does not bruise/bleed easily.  Psychiatric/Behavioral:  Positive for sleep disturbance and suicidal ideas. Negative for agitation, confusion, decreased concentration and dysphoric mood. The patient is not nervous/anxious.     Objective:   Today's Vitals: BP 117/72 (BP Location: Left Arm, Patient Position: Sitting, Cuff Size: Normal)   Pulse 70   Temp 98.1 F (36.7 C) (Oral)   Ht 5\' 8"  (1.727 m)   Wt 141 lb (64  kg)   SpO2 97%   BMI 21.44 kg/m   Physical Exam Vitals reviewed.  Constitutional:      Appearance: Normal appearance. He is well-developed. He is not diaphoretic.  HENT:     Head: Normocephalic and atraumatic.     Nose: No nasal deformity, septal deviation, mucosal edema or rhinorrhea.     Right Sinus: No maxillary sinus tenderness or frontal sinus tenderness.     Left Sinus: No maxillary sinus tenderness or frontal sinus tenderness.     Mouth/Throat:     Pharynx: No oropharyngeal exudate.  Eyes:     General: No scleral icterus.    Conjunctiva/sclera: Conjunctivae normal.     Pupils: Pupils are equal, round, and reactive to light.  Neck:     Thyroid: No thyromegaly.     Vascular: No carotid bruit or JVD.     Trachea: Trachea normal. No tracheal tenderness or tracheal deviation.  Cardiovascular:     Rate and Rhythm: Normal rate and regular rhythm.     Chest Wall: PMI is not displaced.     Pulses: Normal pulses. No decreased pulses.     Heart sounds: Normal heart sounds, S1 normal and S2 normal. Heart sounds not distant. No murmur heard.    No systolic murmur is present.     No diastolic murmur is present.     No friction rub. No gallop. No S3 or S4 sounds.  Pulmonary:     Effort: No tachypnea, accessory muscle usage or respiratory distress.     Breath sounds: No stridor. No decreased breath sounds, wheezing, rhonchi or rales.  Chest:     Chest wall: No tenderness.  Abdominal:     General: Bowel sounds are normal. There is no distension.     Palpations: Abdomen is soft. Abdomen is not rigid.     Tenderness: There is  no abdominal tenderness. There is no guarding or rebound.  Musculoskeletal:        General: Tenderness present. Normal range of motion.     Cervical back: Normal range of motion and neck supple. No edema, erythema or rigidity. No muscular tenderness. Normal range of motion.     Comments: Tenderness and atrophy of the left upper and lower extremity tenderness and  atrophy left upper and lower extremity muscles  Lymphadenopathy:     Head:     Right side of head: No submental or submandibular adenopathy.     Left side of head: No submental or submandibular adenopathy.     Cervical: No cervical adenopathy.  Skin:    General: Skin is warm and dry.     Coloration: Skin is not pale.     Findings: No rash.     Nails: There is no clubbing.  Neurological:     Mental Status: He is alert and oriented to person, place, and time. Mental status is at baseline.     Sensory: No sensory deficit.     Motor: Weakness present.     Coordination: Coordination abnormal.     Gait: Gait abnormal.  Psychiatric:        Attention and Perception: Attention and perception normal.        Mood and Affect: Mood is depressed.        Speech: Speech normal.        Behavior: Behavior normal.        Thought Content: Thought content includes suicidal ideation. Thought content does not include homicidal ideation. Thought content does not include homicidal or suicidal plan.        Cognition and Memory: Cognition and memory normal.        Judgment: Judgment normal.   Manual ABI assessment performed there is no difference in blood pressure from the right arm versus both lower extremities no vascular disease seen  Assessment & Plan:   Problem List Items Addressed This Visit       Musculoskeletal and Integument   RESOLVED: Rhabdomyolysis - Primary    This has resolved      Relevant Orders   CBC with Differential/Platelet     Other   Schizoaffective disorder, depressive type    As per behavioral health continue Lexapro      Relevant Medications   escitalopram (LEXAPRO) 5 MG tablet   Alcohol dependence, uncomplicated    Patient is still drinking some alcohol but is markedly less in amount  Advise further abstinence from alcohol      Relevant Orders   Comprehensive metabolic panel   Generalized anxiety disorder    Continue Lexapro reduce dose to 5 mg      Relevant  Medications   escitalopram (LEXAPRO) 5 MG tablet   Tobacco abuse    Patient smoking 1 cigarillos a day advised to get nicotine replacement      Other Visit Diagnoses     Grief reaction       Relevant Medications   escitalopram (LEXAPRO) 5 MG tablet   Positive depression screening       Relevant Medications   escitalopram (LEXAPRO) 5 MG tablet   Screening cholesterol level       Relevant Orders   Lipid panel      Outpatient Encounter Medications as of 05/31/2022  Medication Sig   cyclobenzaprine (FLEXERIL) 10 MG tablet Take 1 tablet (10 mg total) by mouth 3 (three) times daily as needed for muscle  spasms.   [DISCONTINUED] escitalopram (LEXAPRO) 10 MG tablet Take 1 tablet (10 mg total) by mouth once daily.   [DISCONTINUED] gabapentin (NEURONTIN) 300 MG capsule Take 1 capsule (300 mg total) by mouth 3 (three) times daily.   escitalopram (LEXAPRO) 5 MG tablet Take 1 tablet (5 mg total) by mouth daily.   gabapentin (NEURONTIN) 300 MG capsule Take 1 capsule (300 mg total) by mouth 3 (three) times daily.   No facility-administered encounter medications on file as of 05/31/2022.  38 min spent on history physical exam education of patient and finally obtaining a physical therapy appointment which I had to call personally to obtain  Tetanus vaccine was given Follow-up: Return in about 6 months (around 11/30/2022) for primary care follow up, chronic conditions.   Shan Levans, MD

## 2022-06-01 ENCOUNTER — Telehealth (INDEPENDENT_AMBULATORY_CARE_PROVIDER_SITE_OTHER): Payer: Self-pay

## 2022-06-01 LAB — COMPREHENSIVE METABOLIC PANEL
ALT: 15 IU/L (ref 0–44)
AST: 19 IU/L (ref 0–40)
Albumin/Globulin Ratio: 1.5 (ref 1.2–2.2)
Albumin: 4 g/dL (ref 3.8–4.9)
Alkaline Phosphatase: 82 IU/L (ref 44–121)
BUN/Creatinine Ratio: 16 (ref 9–20)
BUN: 15 mg/dL (ref 6–24)
Bilirubin Total: <0.2 mg/dL (ref 0.0–1.2)
CO2: 19 mmol/L — ABNORMAL LOW (ref 20–29)
Calcium: 9.5 mg/dL (ref 8.7–10.2)
Chloride: 107 mmol/L — ABNORMAL HIGH (ref 96–106)
Creatinine, Ser: 0.93 mg/dL (ref 0.76–1.27)
Globulin, Total: 2.7 g/dL (ref 1.5–4.5)
Glucose: 90 mg/dL (ref 70–99)
Potassium: 4.6 mmol/L (ref 3.5–5.2)
Sodium: 138 mmol/L (ref 134–144)
Total Protein: 6.7 g/dL (ref 6.0–8.5)
eGFR: 95 mL/min/{1.73_m2} (ref >59)

## 2022-06-01 LAB — CBC WITH DIFFERENTIAL/PLATELET
Basophils Absolute: 0.1 10*3/uL (ref 0.0–0.2)
Basos: 1 %
EOS (ABSOLUTE): 0.4 10*3/uL (ref 0.0–0.4)
Eos: 8 %
Hematocrit: 40 % (ref 37.5–51.0)
Hemoglobin: 13.6 g/dL (ref 13.0–17.7)
Immature Grans (Abs): 0 10*3/uL (ref 0.0–0.1)
Immature Granulocytes: 0 %
Lymphocytes Absolute: 2.2 10*3/uL (ref 0.7–3.1)
Lymphs: 39 %
MCH: 32.4 pg (ref 26.6–33.0)
MCHC: 34 g/dL (ref 31.5–35.7)
MCV: 95 fL (ref 79–97)
Monocytes Absolute: 0.6 10*3/uL (ref 0.1–0.9)
Monocytes: 11 %
Neutrophils Absolute: 2.3 10*3/uL (ref 1.4–7.0)
Neutrophils: 41 %
Platelets: 359 10*3/uL (ref 150–450)
RBC: 4.2 x10E6/uL (ref 4.14–5.80)
RDW: 13.3 % (ref 11.6–15.4)
WBC: 5.7 10*3/uL (ref 3.4–10.8)

## 2022-06-01 LAB — LIPID PANEL
Chol/HDL Ratio: 2.3 ratio (ref 0.0–5.0)
Cholesterol, Total: 189 mg/dL (ref 100–199)
HDL: 83 mg/dL (ref >39)
LDL Chol Calc (NIH): 92 mg/dL (ref 0–99)
Triglycerides: 74 mg/dL (ref 0–149)
VLDL Cholesterol Cal: 14 mg/dL (ref 5–40)

## 2022-06-01 NOTE — Progress Notes (Signed)
Let patient know cholesterol is improved blood counts normal liver and kidneys normal

## 2022-06-01 NOTE — Telephone Encounter (Signed)
Pt was called and is aware of results, DOB was confirmed.  ?

## 2022-06-01 NOTE — Telephone Encounter (Signed)
-----   Message from Storm Frisk, MD sent at 06/01/2022  6:11 AM EDT ----- Let patient know cholesterol is improved blood counts normal liver and kidneys normal

## 2022-06-06 ENCOUNTER — Other Ambulatory Visit: Payer: Self-pay

## 2022-06-07 ENCOUNTER — Other Ambulatory Visit: Payer: Self-pay

## 2022-06-12 ENCOUNTER — Encounter: Payer: Medicaid Other | Admitting: Physical Medicine and Rehabilitation

## 2022-07-13 ENCOUNTER — Encounter: Payer: Medicaid Other | Admitting: Physical Medicine and Rehabilitation

## 2022-09-04 ENCOUNTER — Encounter: Payer: MEDICAID | Attending: Physical Medicine and Rehabilitation | Admitting: Physical Medicine and Rehabilitation

## 2022-09-04 VITALS — BP 114/76 | HR 74 | Ht 68.0 in | Wt 130.0 lb

## 2022-09-04 DIAGNOSIS — G629 Polyneuropathy, unspecified: Secondary | ICD-10-CM | POA: Insufficient documentation

## 2022-09-04 DIAGNOSIS — G4700 Insomnia, unspecified: Secondary | ICD-10-CM

## 2022-09-04 DIAGNOSIS — G4701 Insomnia due to medical condition: Secondary | ICD-10-CM | POA: Insufficient documentation

## 2022-09-04 DIAGNOSIS — B351 Tinea unguium: Secondary | ICD-10-CM | POA: Diagnosis not present

## 2022-09-04 DIAGNOSIS — L853 Xerosis cutis: Secondary | ICD-10-CM | POA: Diagnosis not present

## 2022-09-04 DIAGNOSIS — M21372 Foot drop, left foot: Secondary | ICD-10-CM

## 2022-09-04 MED ORDER — CAPSAICIN-CLEANSING GEL 8 % EX KIT
2.0000 | PACK | Freq: Once | CUTANEOUS | Status: AC
  Administered 2022-09-04: 2 via TOPICAL

## 2022-09-04 NOTE — Progress Notes (Signed)
Subjective:    Patient ID: Brett Butler, male    DOB: 29-Aug-1965, 57 y.o.   MRN: [id number]  HPI Brett Butler is a 57 year old man who presents for follow-up of neuropathy  1) Neuropathy -started suddenly in September of last year -Qutenza was helpful to him last several visits.  -he has noticed increased sensation in left lower extremity, increased itching -he was told his pain started due to fentanyl use -he has read most of my pain journal and would like to read more today -he asks about how turmeric can be used -he work up and suddenly he could not stand on the leg -it has improved- it was all the way up in the leg and now is only on the calf and foot -he has tried gabapentin but did not feel like it was helping -he was on Gabapentin 300mg  and it did help but he felt it did not help enough so he stopped.   2) Insomnia: -usus muscle relaxers to help him sleep at night -he has gabapentin that he uses prn  3) Dry skin -he asks whether cooling gel could be causing this -has been having itching   4) Left foot drop: -needs a new AFO as his current is broken  5) Onchomycosis: -says that Dr. Delford Field recommends that he follows with a podiatrist  Pain Inventory Average Pain 7 Pain Right Now 7 My pain is constant, sharp, burning, and aching  In the last 24 hours, has pain interfered with the following? General activity 7 Relation with others 7 Enjoyment of life 7 What TIME of day is your pain at its worst? night Sleep (in general) Fair  Pain is worse with: walking and bending Pain improves with: heat/ice Relief from Meds: 4  walk with assistance use a cane use a walker how many minutes can you walk? 40 ability to climb steps?  yes do you drive?  no  not employed: date last employed .  weakness numbness trouble walking spasms  New pt  New pt    No family history on file. Social History   Socioeconomic History   Marital status: Legally Separated     Spouse name: Not on file   Number of children: Not on file   Years of education: Not on file   Highest education level: Not on file  Occupational History   Not on file  Tobacco Use   Smoking status: Every Day    Types: Cigarettes   Smokeless tobacco: Never  Substance and Sexual Activity   Alcohol use: Yes    Alcohol/week: 10.0 standard drinks of alcohol    Types: 10 Cans of beer per week   Drug use: Yes    Types: Cocaine   Sexual activity: Not on file  Other Topics Concern   Not on file  Social History Narrative   Not on file   Social Determinants of Health   Financial Resource Strain: Not on file  Food Insecurity: Not on file  Transportation Needs: Not on file  Physical Activity: Not on file  Stress: Not on file  Social Connections: Not on file   Past Surgical History:  Procedure Laterality Date   ANKLE SURGERY     IR FLUORO GUIDE CV LINE RIGHT  09/30/2020   IR FLUORO GUIDE CV LINE RIGHT  10/07/2020   IR REMOVAL TUN CV CATH W/O FL  10/22/2020   IR US GUIDE VASC ACCESS RIGHT  09/30/2020   IR US GUIDE VASC ACCESS  RIGHT  10/07/2020   Past Medical History:  Diagnosis Date   ARF (acute renal failure) (HCC) 09/28/2020   Hyperkalemia 09/29/2020   Rhabdomyolysis 09/29/2020   BP 114/76   Pulse 74   Ht 5\' 8"  (1.727 m)   Wt 130 lb (59 kg)   SpO2 98%   BMI 19.77 kg/m   Opioid Risk Score:   Fall Risk Score:  `1  Depression screen [facility] 2/9     09/04/2022   10:11 AM 05/31/2022    9:45 AM 03/13/2022    9:52 AM 01/26/2022   10:38 AM 01/26/2022   10:14 AM 08/11/2021    9:41 AM 08/02/2021   11:19 AM  Depression screen PHQ 2/9  Decreased Interest 1 1 0 2 2 1 1   Down, Depressed, Hopeless 2 2 0 2 2 1 1   PHQ - 2 Score 3 3 0 4 4 2 2   Altered sleeping  2  2 2 3 2   Tired, decreased energy  2  2 2 1 2   Change in appetite  1  0 0 3 1  Feeling bad or failure about yourself   2  3 3 2 2   Trouble concentrating  0  0 0 2 1  Moving slowly or fidgety/restless  0  0 0 2 1  Suicidal  thoughts  0  1 3 0 1  PHQ-9 Score  10  12 14 15 12   Difficult doing work/chores      Somewhat difficult      Review of Systems  Respiratory:  Positive for cough and shortness of breath.   Musculoskeletal:  Positive for gait problem.       Lower left leg pain spasms  Neurological:  Positive for weakness and numbness.  All other systems reviewed and are negative.      Objective:   Physical Exam Gen: no distress, normal appearing, weight 146 lbs, BMI 22.20, BP 110/73 HEENT: oral mucosa pink and moist, NCAT Cardio: Reg rate Chest: normal effort, normal rate of breathing Abd: soft, non-distended Ext: no edema Psych: pleasant, normal affect Skin: intact, dry Neuro: Alert and oriented x3, decreased sensation Musculoskeletal: Uses cane for ambulation, left foot drop, using AFO       Assessment & Plan:   1) Neuropathy -Discussed Qutenza as an option for neuropathic pain control. Discussed that this is a capsaicin patch, stronger than capsaicin cream. Discussed that it is currently approved for diabetic peripheral neuropathy and post-herpetic neuralgia, but that it has also shown benefit in treating other forms of neuropathy. Provided patient with link to site to learn more about the patch: https://www.clark.biz/. Discussed that the patch would be placed in office and benefits usually last 3 months. Discussed that unintended exposure to capsaicin can cause severe irritation of eyes, mucous membranes, respiratory tract, and skin, but that Qutenza is a local treatment and does not have the systemic side effects of other nerve medications. Discussed that there may be pain, itching, erythema, and decreased sensory function associated with the application of Qutenza. Side effects usually subside within 1 week. A cold pack of analgesic medications can help with these side effects. Blood pressure can also be increased due to pain associated with administration of the patch.   2 patches of  Qutenza was applied to the area of pain. Ice packs were applied during the procedure to ensure patient comfort. Blood pressure was monitored every 15 minutes. The patient tolerated the procedure well. Post-procedure instructions were given and follow-up has been  scheduled.  Continue Gabapentin 300mg  HS prn  -Provided with a pain relief journal and discussed that it contains foods and lifestyle tips to naturally help to improve pain. Discussed that these lifestyle strategies are also very good for health unlike some medications which can have negative side effects. Discussed that the act of keeping a journal can be therapeutic and helpful to realize patterns what helps to trigger and alleviate pain.    Turmeric to reduce inflammation--can be used in cooking or taken as a supplement.  Benefits of turmeric:  -Highly anti-inflammatory  -Increases antioxidants  -Improves memory, attention, brain disease  -Lowers risk of heart disease  -May help prevent cancer  -Decreases pain  -Alleviates depression  -Delays aging and decreases risk of chronic disease  -Consume with black pepper to increase absorption    Turmeric Milk Recipe:  1 cup milk  1 tsp turmeric  1 tsp cinnamon  1 tsp grated ginger (optional)  Black pepper (boosts the anti-inflammatory properties of turmeric).  1 tsp honey     2) Insomnia: Continue Gabapentin 300mg  HS prn  3) Left foot drop: -continue AFO, will provide order for a new one since current one is broken -prescribed PT  4) Dry skin: -advised applying coconut oil  5) Onchomychosis: -referred to podiatry -recommended soaking feet in mixture of apple cider vinegar diluted with water for 10 minutes per day  >40 minutes spent in discussion of risks and benefits of Qutenza and obtaining informed consent, discussion of q90 day follow-up and expectation of improvement in pain with each repeat application, discussed applying coconut oil for dry skin,  continue gabapentin prn for his neuropathy and insomnia, referred to podiatry for management of onchomychosis, recommended soaking feet in mixture of apple cider vinegar diluted with water for 10 minutes per day

## 2022-09-14 ENCOUNTER — Ambulatory Visit: Payer: Medicaid Other | Admitting: Critical Care Medicine

## 2022-11-30 ENCOUNTER — Ambulatory Visit: Payer: Medicaid Other | Admitting: Critical Care Medicine

## 2022-12-04 ENCOUNTER — Encounter: Payer: MEDICAID | Attending: Physical Medicine and Rehabilitation | Admitting: Physical Medicine and Rehabilitation

## 2022-12-04 DIAGNOSIS — L853 Xerosis cutis: Secondary | ICD-10-CM | POA: Insufficient documentation

## 2022-12-04 DIAGNOSIS — G629 Polyneuropathy, unspecified: Secondary | ICD-10-CM | POA: Insufficient documentation

## 2022-12-04 DIAGNOSIS — B351 Tinea unguium: Secondary | ICD-10-CM | POA: Insufficient documentation

## 2022-12-04 DIAGNOSIS — G4701 Insomnia due to medical condition: Secondary | ICD-10-CM | POA: Insufficient documentation

## 2022-12-06 ENCOUNTER — Ambulatory Visit: Payer: Medicaid Other | Admitting: Physician Assistant

## 2023-04-28 IMAGING — CT CT ABD-PELV W/O CM
2 of 4 series · 17 of 46 positions shown, 19 images · non-contrast
Comparison: None.

CLINICAL DATA: Left leg pain

EXAM:
CT ABDOMEN AND PELVIS WITHOUT CONTRAST
TECHNIQUE: Multidetector CT imaging of the abdomen and pelvis was performed
following the standard protocol without IV contrast.

[Series 4: coronal st · coronal · 0.83mm/px · 3 of 81 slices shown]
[im 27/81  soft-tissue]
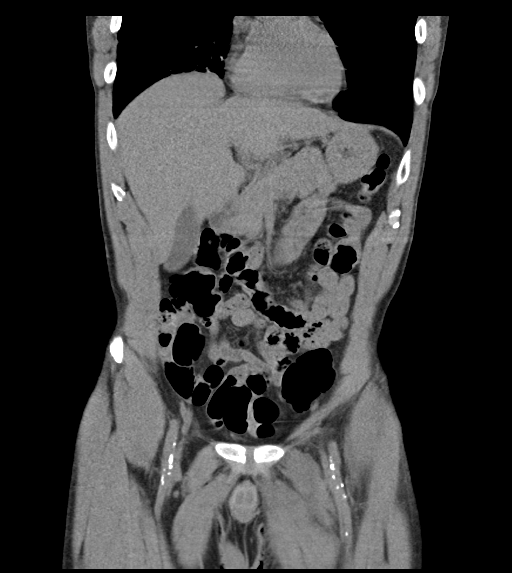
[im 36/81  soft-tissue]
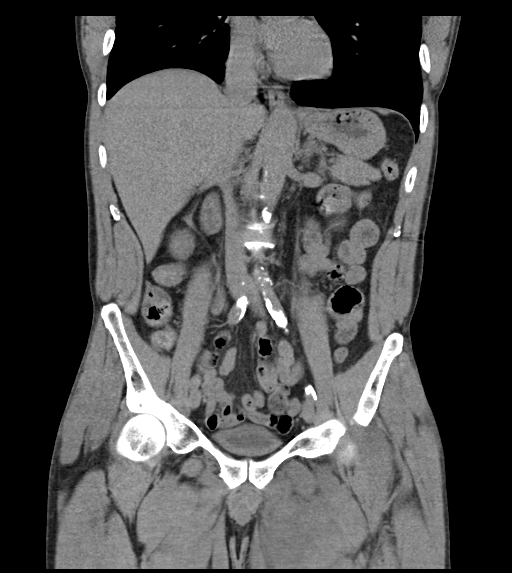
[im 45/81  soft-tissue]
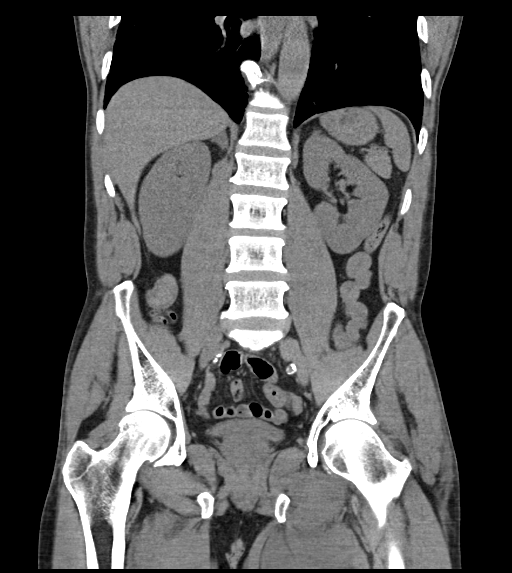

[Series 6: axial st · axial · 0.70mm/px · z∈[+1246,+1646]mm · 14 of 92 slices shown, 16 images]
[im 6/92  soft-tissue]
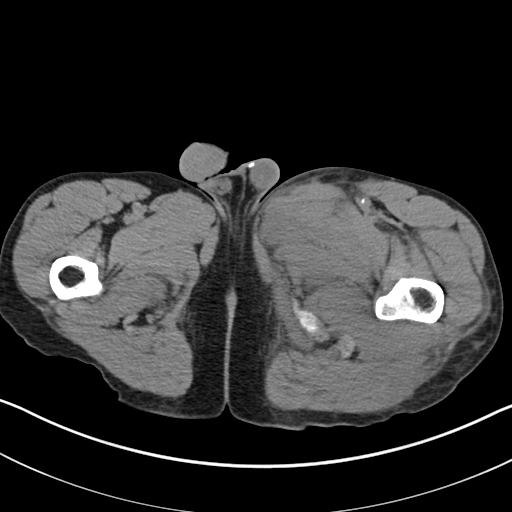
[im 6/92  bone]
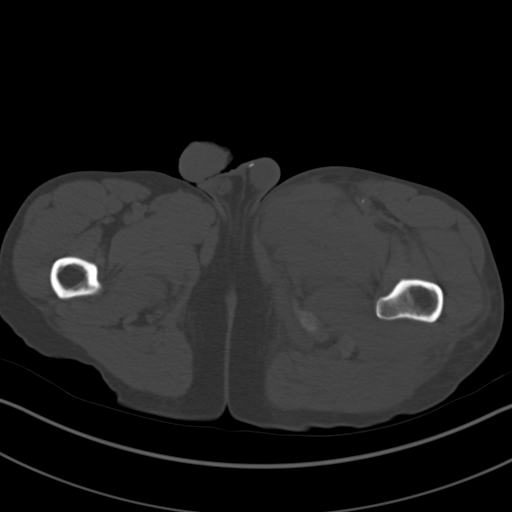
[im 11/92  soft-tissue]
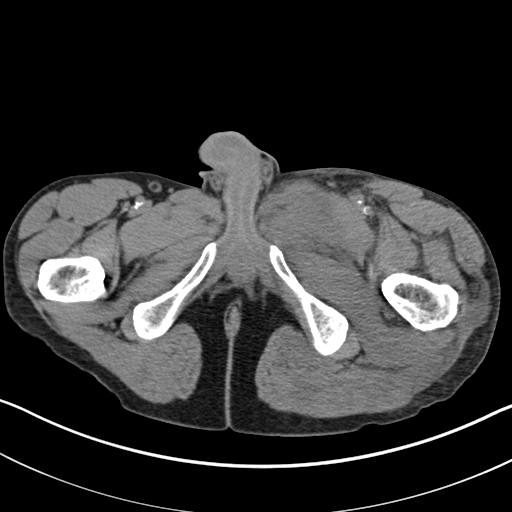
[im 21/92  soft-tissue]
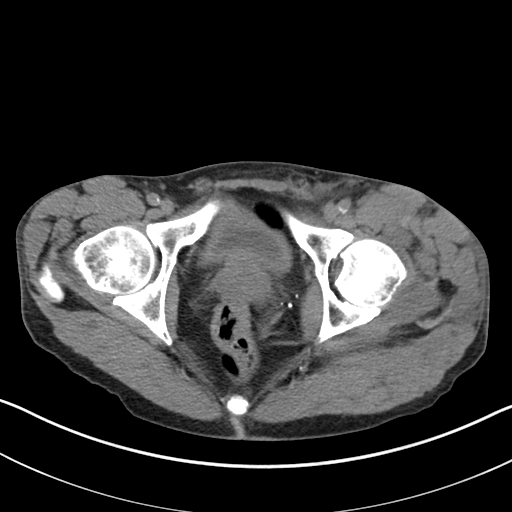
[im 26/92  soft-tissue]
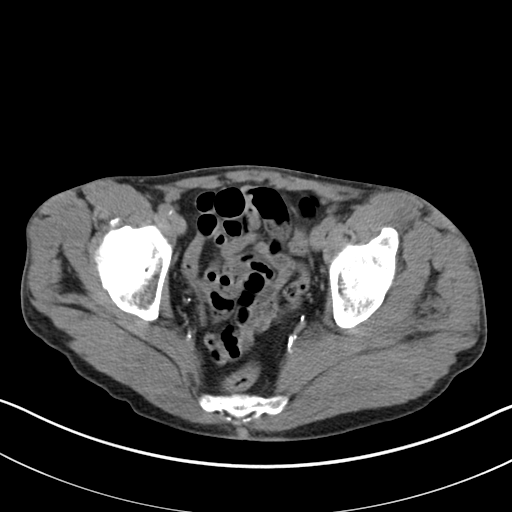
[im 31/92  soft-tissue]
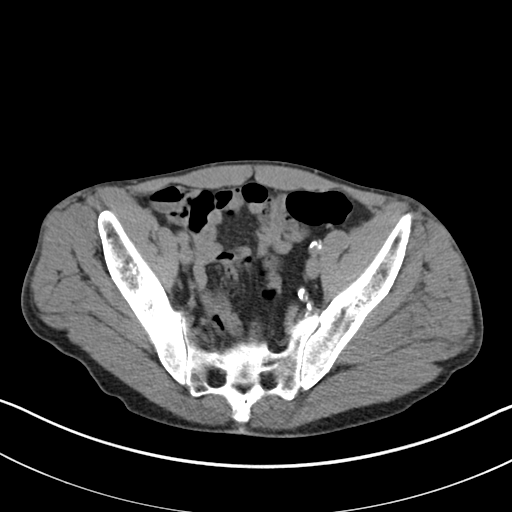
[im 36/92  soft-tissue]
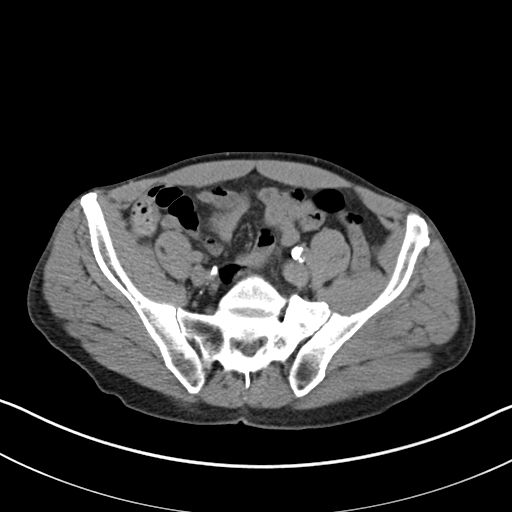
[im 41/92  soft-tissue]
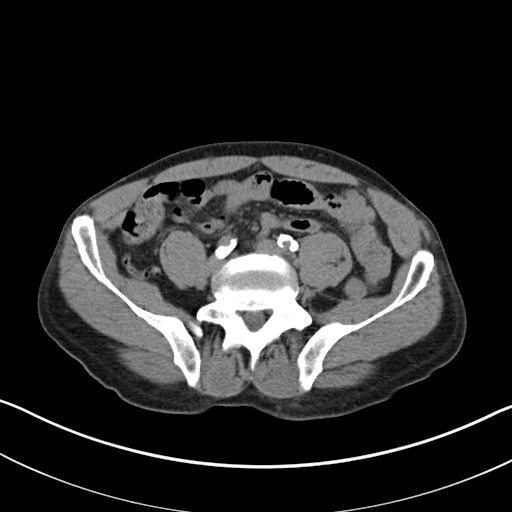
[im 51/92  soft-tissue]
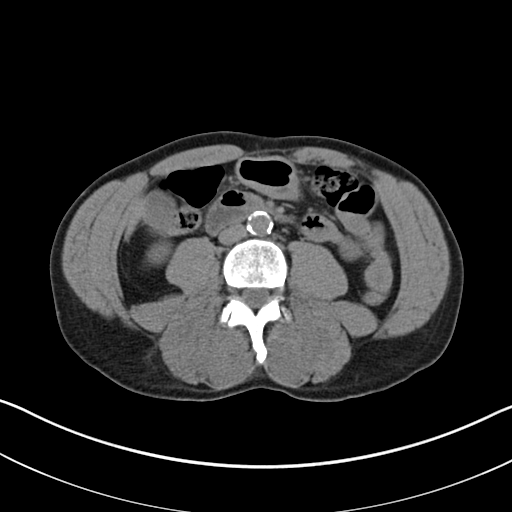
[im 56/92  soft-tissue]
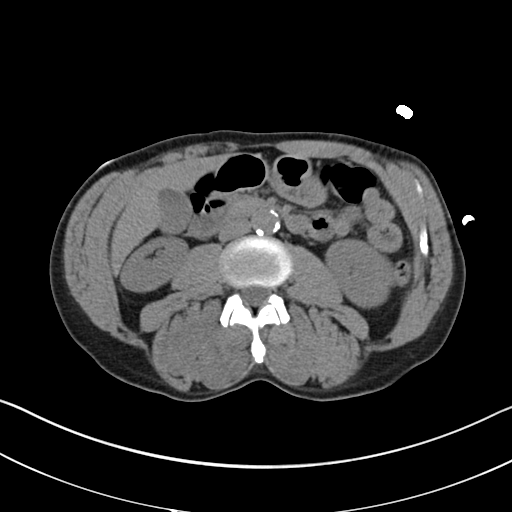
[im 56/92  bone]
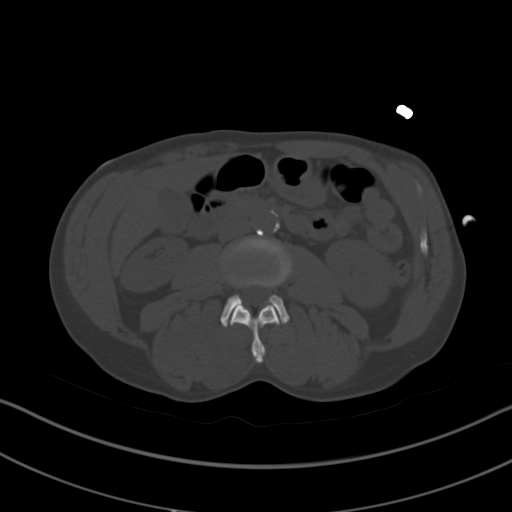
[im 61/92  soft-tissue]
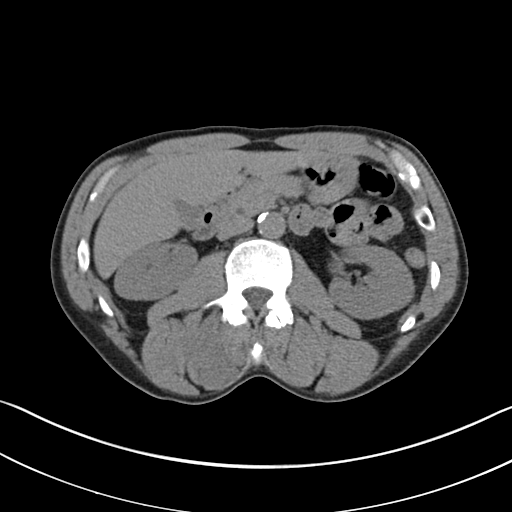
[im 66/92  soft-tissue]
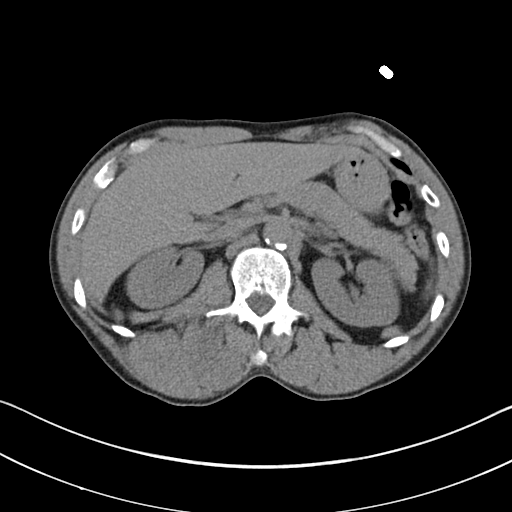
[im 71/92  soft-tissue]
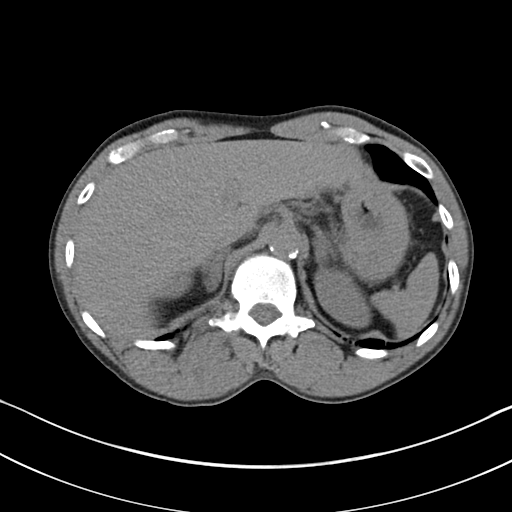
[im 81/92  soft-tissue]
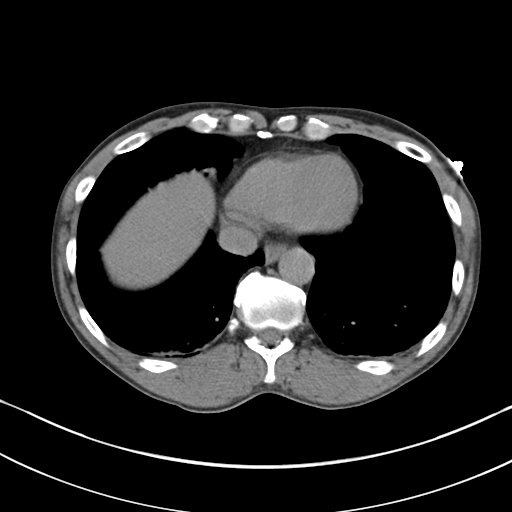
[im 86/92  soft-tissue]
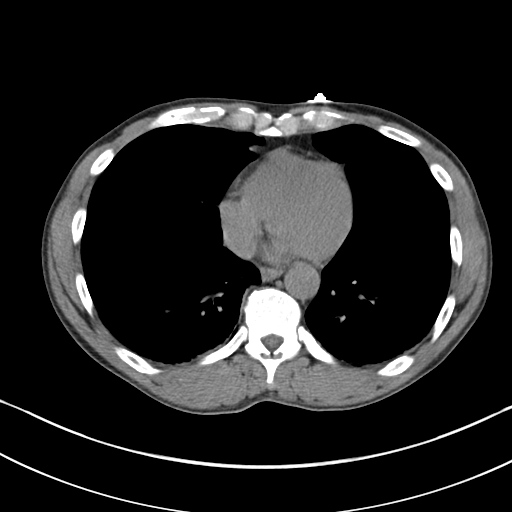

[17 of 46 positions shown; findings below may reference images not displayed]

FINDINGS: LOWER CHEST: Bibasilar atelectasis

HEPATOBILIARY: Normal hepatic contours. No intra- or extrahepatic
biliary dilatation. The gallbladder is normal.

PANCREAS: Normal pancreas. No ductal dilatation or peripancreatic
fluid collection.

SPLEEN: Normal.

ADRENALS/URINARY TRACT: The adrenal glands are normal. No
hydronephrosis, nephroureterolithiasis or solid renal mass. The
urinary bladder is normal for degree of distention

STOMACH/BOWEL: There is no hiatal hernia. Normal duodenal course and
caliber. No small bowel dilatation or inflammation. No focal colonic
abnormality. Normal appendix.

VASCULAR/LYMPHATIC: There is calcific atherosclerosis of the
abdominal aorta. No lymphadenopathy.

REPRODUCTIVE: Normal prostate size with symmetric seminal vesicles.

MUSCULOSKELETAL. No bony spinal canal stenosis or focal osseous
abnormality.

OTHER: Large intramuscular hematoma the left thigh is better
characterized on concomitant CT of the left lower extremity.
IMPRESSION: 1. Large intramuscular hematoma the left thigh is better
characterized on concomitant CT of the left lower extremity.
2. No acute abnormality of the abdomen or pelvis.

Aortic Atherosclerosis ([id number]).

## 2023-09-06 ENCOUNTER — Ambulatory Visit (HOSPITAL_COMMUNITY)
Admission: EM | Admit: 2023-09-06 | Discharge: 2023-09-07 | Disposition: A | Discharge status: Home / Self Care | Payer: MEDICAID | Attending: Psychiatry | Admitting: Psychiatry

## 2023-09-06 DIAGNOSIS — Z5901 Sheltered homelessness: Secondary | ICD-10-CM | POA: Insufficient documentation

## 2023-09-06 DIAGNOSIS — Z91128 Patient's intentional underdosing of medication regimen for other reason: Secondary | ICD-10-CM | POA: Insufficient documentation

## 2023-09-06 DIAGNOSIS — F1410 Cocaine abuse, uncomplicated: Secondary | ICD-10-CM | POA: Insufficient documentation

## 2023-09-06 DIAGNOSIS — F1090 Alcohol use, unspecified, uncomplicated: Secondary | ICD-10-CM | POA: Insufficient documentation

## 2023-09-06 DIAGNOSIS — F1190 Opioid use, unspecified, uncomplicated: Secondary | ICD-10-CM | POA: Insufficient documentation

## 2023-09-06 DIAGNOSIS — F1290 Cannabis use, unspecified, uncomplicated: Secondary | ICD-10-CM

## 2023-09-06 DIAGNOSIS — F17210 Nicotine dependence, cigarettes, uncomplicated: Secondary | ICD-10-CM | POA: Insufficient documentation

## 2023-09-06 DIAGNOSIS — F1210 Cannabis abuse, uncomplicated: Secondary | ICD-10-CM | POA: Diagnosis not present

## 2023-09-06 DIAGNOSIS — Z560 Unemployment, unspecified: Secondary | ICD-10-CM | POA: Insufficient documentation

## 2023-09-06 DIAGNOSIS — F209 Schizophrenia, unspecified: Secondary | ICD-10-CM | POA: Diagnosis not present

## 2023-09-06 DIAGNOSIS — T43506A Underdosing of unspecified antipsychotics and neuroleptics, initial encounter: Secondary | ICD-10-CM | POA: Insufficient documentation

## 2023-09-06 DIAGNOSIS — F1590 Other stimulant use, unspecified, uncomplicated: Secondary | ICD-10-CM

## 2023-09-06 DIAGNOSIS — R634 Abnormal weight loss: Secondary | ICD-10-CM | POA: Insufficient documentation

## 2023-09-06 LAB — VITAMIN B12: Vitamin B-12: 314 pg/mL (ref 180–914)

## 2023-09-06 LAB — POCT URINE DRUG SCREEN - MANUAL ENTRY (I-SCREEN)
POC Amphetamine UR: NOT DETECTED
POC Buprenorphine (BUP): NOT DETECTED
POC Cocaine UR: NOT DETECTED
POC Marijuana UR: POSITIVE — AB
POC Methadone UR: NOT DETECTED
POC Methamphetamine UR: NOT DETECTED
POC Morphine: NOT DETECTED
POC Oxazepam (BZO): NOT DETECTED
POC Oxycodone UR: NOT DETECTED
POC Secobarbital (BAR): NOT DETECTED

## 2023-09-06 LAB — CBC
HCT: 41 % (ref 39.0–52.0)
Hemoglobin: 13.8 g/dL (ref 13.0–17.0)
MCH: 32.5 pg (ref 26.0–34.0)
MCHC: 33.7 g/dL (ref 30.0–36.0)
MCV: 96.5 fL (ref 80.0–100.0)
Platelets: 212 K/uL (ref 150–400)
RBC: 4.25 MIL/uL (ref 4.22–5.81)
RDW: 14.3 % (ref 11.5–15.5)
WBC: 7 K/uL (ref 4.0–10.5)
nRBC: 0 % (ref 0.0–0.2)

## 2023-09-06 LAB — VITAMIN D 25 HYDROXY (VIT D DEFICIENCY, FRACTURES): Vit D, 25-Hydroxy: 22.98 ng/mL — ABNORMAL LOW (ref 30–100)

## 2023-09-06 LAB — URINALYSIS, ROUTINE W REFLEX MICROSCOPIC
Bacteria, UA: NONE SEEN
Bilirubin Urine: NEGATIVE
Glucose, UA: NEGATIVE mg/dL
Ketones, ur: NEGATIVE mg/dL
Leukocytes,Ua: NEGATIVE
Nitrite: NEGATIVE
Protein, ur: NEGATIVE mg/dL
Specific Gravity, Urine: 1.02 (ref 1.005–1.030)
pH: 5 (ref 5.0–8.0)

## 2023-09-06 LAB — MAGNESIUM: Magnesium: 2 mg/dL (ref 1.7–2.4)

## 2023-09-06 LAB — COMPREHENSIVE METABOLIC PANEL WITH GFR
ALT: 12 U/L (ref 0–44)
AST: 15 U/L (ref 15–41)
Albumin: 3.7 g/dL (ref 3.5–5.0)
Alkaline Phosphatase: 46 U/L (ref 38–126)
Anion gap: 9 (ref 5–15)
BUN: 20 mg/dL (ref 6–20)
CO2: 20 mmol/L — ABNORMAL LOW (ref 22–32)
Calcium: 9.3 mg/dL (ref 8.9–10.3)
Chloride: 107 mmol/L (ref 98–111)
Creatinine, Ser: 0.87 mg/dL (ref 0.61–1.24)
GFR, Estimated: >60 mL/min (ref >60)
Glucose, Bld: 70 mg/dL (ref 70–99)
Potassium: 4 mmol/L (ref 3.5–5.1)
Sodium: 136 mmol/L (ref 135–145)
Total Bilirubin: 0.8 mg/dL (ref 0.0–1.2)
Total Protein: 7.2 g/dL (ref 6.5–8.1)

## 2023-09-06 LAB — HEMOGLOBIN A1C
Hgb A1c MFr Bld: 5.7 % — ABNORMAL HIGH (ref 4.8–5.6)
Mean Plasma Glucose: 116.89 mg/dL

## 2023-09-06 LAB — ETHANOL: Alcohol, Ethyl (B): <15 mg/dL (ref <15)

## 2023-09-06 LAB — FOLATE: Folate: 14.6 ng/mL (ref >5.9)

## 2023-09-06 LAB — TSH: TSH: 3.144 u[IU]/mL (ref 0.350–4.500)

## 2023-09-06 MED ORDER — ACETAMINOPHEN 325 MG PO TABS: 650.0000 mg | ORAL_TABLET | Freq: Four times a day (QID) | ORAL | Status: DC | PRN

## 2023-09-06 MED ORDER — HYDROXYZINE HCL 10 MG PO TABS
10.0000 mg | ORAL_TABLET | Freq: Three times a day (TID) | ORAL | Status: DC | PRN
Start: 2023-09-06 — End: 2023-09-07

## 2023-09-06 MED ORDER — DIPHENHYDRAMINE HCL 50 MG/ML IJ SOLN: 50.0000 mg | Freq: Three times a day (TID) | INTRAMUSCULAR | Status: DC | PRN

## 2023-09-06 MED ORDER — MAGNESIUM HYDROXIDE 400 MG/5ML PO SUSP: 30.0000 mL | Freq: Every day | ORAL | Status: DC | PRN

## 2023-09-06 MED ORDER — LORAZEPAM 2 MG/ML IJ SOLN: 2.0000 mg | Freq: Three times a day (TID) | INTRAMUSCULAR | Status: DC | PRN

## 2023-09-06 MED ORDER — THIAMINE HCL 100 MG/ML IJ SOLN
100.0000 mg | Freq: Once | INTRAMUSCULAR | Status: AC
  Administered 2023-09-06: 100 mg via INTRAMUSCULAR
  Filled 2023-09-06: qty 2

## 2023-09-06 MED ORDER — NICOTINE 21 MG/24HR TD PT24
21.0000 mg | MEDICATED_PATCH | Freq: Every day | TRANSDERMAL | Status: DC
  Administered 2023-09-07: 21 mg via TRANSDERMAL
  Filled 2023-09-06: qty 1

## 2023-09-06 MED ORDER — HALOPERIDOL LACTATE 5 MG/ML IJ SOLN
5.0000 mg | Freq: Three times a day (TID) | INTRAMUSCULAR | Status: DC | PRN
Start: 2023-09-06 — End: 2023-09-07

## 2023-09-06 MED ORDER — LURASIDONE HCL 40 MG PO TABS
40.0000 mg | ORAL_TABLET | Freq: Every day | ORAL | Status: DC
  Administered 2023-09-06: 40 mg via ORAL
  Filled 2023-09-06: qty 1

## 2023-09-06 MED ORDER — ALUM & MAG HYDROXIDE-SIMETH 200-200-20 MG/5ML PO SUSP: 30.0000 mL | ORAL | Status: DC | PRN

## 2023-09-06 MED ORDER — HALOPERIDOL LACTATE 5 MG/ML IJ SOLN: 10.0000 mg | Freq: Three times a day (TID) | INTRAMUSCULAR | Status: DC | PRN

## 2023-09-06 MED ORDER — IBUPROFEN 200 MG PO TABS
200.0000 mg | ORAL_TABLET | Freq: Four times a day (QID) | ORAL | Status: DC | PRN
  Administered 2023-09-06: 200 mg via ORAL
  Filled 2023-09-06: qty 1

## 2023-09-06 MED ORDER — THIAMINE MONONITRATE 100 MG PO TABS
100.0000 mg | ORAL_TABLET | Freq: Every day | ORAL | Status: DC
  Administered 2023-09-07: 100 mg via ORAL
  Filled 2023-09-06: qty 1

## 2023-09-06 MED ORDER — HALOPERIDOL 5 MG PO TABS: 5.0000 mg | ORAL_TABLET | Freq: Three times a day (TID) | ORAL | Status: DC | PRN

## 2023-09-06 MED ORDER — DIPHENHYDRAMINE HCL 50 MG PO CAPS: 50.0000 mg | ORAL_CAPSULE | Freq: Three times a day (TID) | ORAL | Status: DC | PRN

## 2023-09-06 MED ORDER — ENSURE ENLIVE PO LIQD
237.0000 mL | Freq: Two times a day (BID) | ORAL | Status: DC
  Administered 2023-09-06 – 2023-09-07 (×2): 237 mL via ORAL
  Filled 2023-09-06 (×4): qty 237

## 2023-09-06 NOTE — ED Notes (Addendum)
 Patient alert & oriented x4. Denies intent to harm self or others when asked. Denies A/VH. Patient reports chronic pain in bilateral knees and L foot, both rating 6/10. Patient uses a cane at baseline. Standard walker provided per facility policy. No acute distress noted. Support and encouragement provided. Patient observed in milieu. No inappropriate behaviors seen or reported. Routine safety checks conducted per facility protocol. Encouraged patient to notify staff if any thoughts of harm towards self or others arise. Patient verbalizes understanding and agreement.
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
## 2023-09-06 NOTE — Discharge Instructions (Addendum)
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
## 2023-09-07 ENCOUNTER — Other Ambulatory Visit: Payer: Self-pay

## 2023-09-07 ENCOUNTER — Encounter (HOSPITAL_COMMUNITY): Payer: Self-pay | Admitting: *Deleted

## 2023-09-07 MED ORDER — LURASIDONE HCL 40 MG PO TABS: 40.0000 mg | ORAL_TABLET | Freq: Every day | ORAL | 0 refills | Status: DC

## 2023-09-07 MED ORDER — ONE-A-DAY MENS PO TABS: 1.0000 | ORAL_TABLET | Freq: Every day | ORAL | Status: AC

## 2023-09-07 NOTE — ED Notes (Signed)
 Pt A&Ox4, calm & cooperative and in NAD at this time. Denies SI/HI/AVH. Contracts for safety. Encouragement and support given. Will continue to monitor.

## 2023-09-07 NOTE — ED Notes (Signed)
 Discharge instructions reviewed w/ pt. Medications, follow-up care and resources reviewed. Pt verbalized understanding. All belongings returned to pt. Pt A&Ox4, ambulatory w/ steady gait and VSS upon departure.

## 2023-09-07 NOTE — ED Notes (Signed)
 Patient is sleeping covering his head. At this time nothing strange observed.
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
## 2023-10-13 ENCOUNTER — Emergency Department (HOSPITAL_COMMUNITY)
Admission: EM | Admit: 2023-10-13 | Discharge: 2023-10-13 | Disposition: A | Discharge status: Home / Self Care | Payer: MEDICAID | Attending: Emergency Medicine | Admitting: Emergency Medicine

## 2023-10-13 ENCOUNTER — Encounter (HOSPITAL_COMMUNITY): Payer: Self-pay

## 2023-10-13 ENCOUNTER — Emergency Department (HOSPITAL_COMMUNITY): Payer: MEDICAID

## 2023-10-13 ENCOUNTER — Other Ambulatory Visit (HOSPITAL_COMMUNITY): Payer: Self-pay

## 2023-10-13 ENCOUNTER — Other Ambulatory Visit: Payer: Self-pay

## 2023-10-13 DIAGNOSIS — R441 Visual hallucinations: Secondary | ICD-10-CM | POA: Diagnosis not present

## 2023-10-13 DIAGNOSIS — Z760 Encounter for issue of repeat prescription: Secondary | ICD-10-CM | POA: Insufficient documentation

## 2023-10-13 DIAGNOSIS — M25512 Pain in left shoulder: Secondary | ICD-10-CM | POA: Insufficient documentation

## 2023-10-13 LAB — COMPREHENSIVE METABOLIC PANEL WITH GFR
ALT: 17 U/L (ref 0–44)
AST: 21 U/L (ref 15–41)
Albumin: 4.2 g/dL (ref 3.5–5.0)
Alkaline Phosphatase: 56 U/L (ref 38–126)
Anion gap: 12 (ref 5–15)
BUN: 16 mg/dL (ref 6–20)
CO2: 20 mmol/L — ABNORMAL LOW (ref 22–32)
Calcium: 9.4 mg/dL (ref 8.9–10.3)
Chloride: 105 mmol/L (ref 98–111)
Creatinine, Ser: 0.9 mg/dL (ref 0.61–1.24)
GFR, Estimated: >60 mL/min (ref >60)
Glucose, Bld: 89 mg/dL (ref 70–99)
Potassium: 3.8 mmol/L (ref 3.5–5.1)
Sodium: 137 mmol/L (ref 135–145)
Total Bilirubin: 0.8 mg/dL (ref 0.0–1.2)
Total Protein: 7.5 g/dL (ref 6.5–8.1)

## 2023-10-13 LAB — CBC
HCT: 39.6 % (ref 39.0–52.0)
Hemoglobin: 13.3 g/dL (ref 13.0–17.0)
MCH: 32.2 pg (ref 26.0–34.0)
MCHC: 33.6 g/dL (ref 30.0–36.0)
MCV: 95.9 fL (ref 80.0–100.0)
Platelets: 333 K/uL (ref 150–400)
RBC: 4.13 MIL/uL — ABNORMAL LOW (ref 4.22–5.81)
RDW: 14.2 % (ref 11.5–15.5)
WBC: 7.6 K/uL (ref 4.0–10.5)
nRBC: 0 % (ref 0.0–0.2)

## 2023-10-13 MED ORDER — LURASIDONE HCL 40 MG PO TABS: 40.0000 mg | ORAL_TABLET | Freq: Every day | ORAL | 0 refills | Status: DC

## 2023-10-13 MED ORDER — NAPROXEN 500 MG PO TABS
500.0000 mg | ORAL_TABLET | Freq: Two times a day (BID) | ORAL | 2 refills | Status: AC
Start: 2023-10-13 — End: 2024-10-12
  Filled 2023-10-13: qty 60, 30d supply, fill #0

## 2023-10-13 MED ORDER — LURASIDONE HCL 20 MG PO TABS
40.0000 mg | ORAL_TABLET | Freq: Every day | ORAL | 0 refills | Status: AC
  Filled 2023-10-13: qty 30, 15d supply, fill #0

## 2023-10-13 NOTE — ED Provider Notes (Signed)
 [facility] Provider Note   CSN: [id number] Arrival date & time: 10/13/23  1117     Patient presents with: Shoulder Pain and Foot Pain   Brett Butler is a 58 y.o. male.   Patient complains of pain in his left shoulder.  Patient is unaware of any injury.  Patient states that he noticed about a week ago.  Patient is also requesting help with getting his medications.  Patient is supposed to be taking Latuda .  Patient states that he had to move and his medicines were lost in the move.  He reports he normally gets his medication from the hospital pharmacy.  Patient denies any current psychiatric crisis.  Patient denies any pain in his neck.  He denies any numbness to his arm he is able to move his shoulder elbow and wrist without pain   Shoulder Pain Foot Pain       Prior to Admission medications   Medication Sig Start Date End Date Taking? Authorizing Provider  naproxen  (NAPROSYN ) 500 MG tablet Take 1 tablet (500 mg total) by mouth 2 (two) times daily with a meal. 10/13/23 10/12/24 Yes Flint Raring K, PA-C  lurasidone  (LATUDA ) 20 MG TABS tablet Take 2 tablets (40 mg total) by mouth at bedtime. 10/13/23   [name] K, PA-C  multivitamin (ONE-A-DAY MEN'S) TABS tablet Take 1 tablet by mouth daily. 09/07/23   Rollene Katz, MD    Allergies: Other    Review of Systems  All other systems reviewed and are negative.   Updated Vital Signs BP 105/77   Pulse 81   Temp 97.9 F (36.6 C)   Resp 18   Ht 5' 8 (1.727 m)   Wt 65.8 kg   SpO2 95%   BMI 22.05 kg/m   Physical Exam Vitals and nursing note reviewed.  Constitutional:      Appearance: He is well-developed.  HENT:     Head: Normocephalic.  Cardiovascular:     Rate and Rhythm: Normal rate.  Pulmonary:     Effort: Pulmonary effort is normal.  Abdominal:     General: There is no distension.  Musculoskeletal:        General: Normal range of motion.     Cervical back:  Normal range of motion.     Comments: Full range of motion left shoulder tender trapezius in the distal area, cervical spine full range of motion nontender to palpation  Skin:    General: Skin is warm.  Neurological:     General: No focal deficit present.     Mental Status: He is alert and oriented to person, place, and time.     (all labs ordered are listed, but only abnormal results are displayed) Labs Reviewed  COMPREHENSIVE METABOLIC PANEL WITH GFR - Abnormal; Notable for the following components:      Result Value   CO2 20 (*)    All other components within normal limits  CBC - Abnormal; Notable for the following components:   RBC 4.13 (*)    All other components within normal limits    EKG: None  Radiology: DG Shoulder Left Result Date: 10/13/2023 CLINICAL DATA:  58 year old male with persistent shoulder pain. EXAM: LEFT SHOULDER - 2+ VIEW COMPARISON:  Chest radiograph 10/07/2020. FINDINGS: Four views. Bone mineralization is within normal limits. No glenohumeral joint dislocation. Proximal left humerus, left clavicle and scapula appear intact and negative. No age advanced degeneration identified. Negative visible left ribs and chest. IMPRESSION:  Normal for age radiographic appearance of the left shoulder. Electronically Signed   By: VEAR Hurst M.D.   On: 10/13/2023 11:55     Procedures   Medications Ordered in the ED - No data to display                                  Medical Decision Making Patient complains of pain in his left shoulder he has not had any injury  Amount and/or Complexity of Data Reviewed Labs: ordered. Radiology: ordered.    Details: X-ray left shoulder shows no evidence of fracture no acute findings Discussion of management or test interpretation with external provider(s): I spoke with the transitions of care social worker who will have [facility] pharmacy fill patient's medications  Risk Prescription drug management. Risk Details: Patient is given a  prescription for naproxen  and his Latuda .  He is given a 15-day supply as this is all of the medication that the pharmacy has.  Patient is advised to follow-up with his care provider for recheck and further medication management he is given the phone number for the orthopedist on-call for evaluation of his shoulder.        Final diagnoses:  Acute pain of left shoulder  Medication refill    ED Discharge Orders          Ordered    lurasidone  (LATUDA ) 40 MG TABS tablet  Daily at bedtime,   Status:  Discontinued        10/13/23 1259    naproxen  (NAPROSYN ) 500 MG tablet  2 times daily with meals        10/13/23 1259    lurasidone  (LATUDA ) 20 MG TABS tablet  Daily at bedtime       Note to Pharmacy: Per secure chat with Merle Cirelli/dispense #30 20mg  for 15DS and she will send RX to patient's pharmacy.   10/13/23 1301           An After Visit Summary was printed and given to the patient.     Flint Sonny POUR, PA-C 10/13/23 1427    Geraldene Hamilton, MD 10/14/23 1121

## 2023-10-13 NOTE — Discharge Instructions (Addendum)
 Follow up with your Physician for medication management.  See the Orthopaedist for evaluation

## 2023-10-13 NOTE — Care Management (Signed)
 Transition of Care [facility]) - Emergency Department Mini Assessment   Patient Details  Name: Brett Butler MRN: [id number] Date of Birth: [birth year]  [facility] CM/SW Contact:    Corean JAYSON Canary, RN Phone Number: 10/13/2023, 1:05 PM   Clinical Narrative:  Consult for medication assistance, patient has run out of medication. He has insurance so is not eligible for [facility] medication assistance. Recommended medication be sent to [facility] pharmacy for pickup  ED Mini Assessment:             Interventions which prevented an admission or readmission: Medication Review    Patient Contact and Communications        ,                 Admission diagnosis:  fever,hallucinations Patient Active Problem List   Diagnosis Date Noted   Tobacco abuse 08/02/2021   Carpal tunnel syndrome of right wrist 08/02/2021   Colon cancer screening 08/02/2021   History of cocaine use 09/29/2020   Injury of left sciatic nerve    Schizoaffective disorder, depressive type (HCC) 09/10/2019   PTSD (post-traumatic stress disorder) 09/10/2019   Alcohol dependence, uncomplicated (HCC) 04/28/2019   Generalized anxiety disorder 04/28/2019   PCP:  Brien Belvie BRAVO, MD Pharmacy:   [facility] [address] [place] [address] Phone: [phone] Fax: [phone]

## 2023-10-13 NOTE — ED Notes (Signed)
 Please update sister

## 2023-10-13 NOTE — ED Triage Notes (Signed)
 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 Pt states his shoulder locks up on him occasionally.  Pt also c.o intermittent visual hallucinations, states he has been out of his Latuda  for the past 2 months due to changes in living arrangements. Pt denies SI

## 2023-10-16 ENCOUNTER — Other Ambulatory Visit (HOSPITAL_COMMUNITY): Payer: Self-pay
